# Patient Record
Sex: Female | Born: 1955 | Race: White | Hispanic: No | Marital: Married | Smoking: Current every day smoker
Health system: Southern US, Community
[De-identification: ages and names within clinical notes are randomized; demographics above are authoritative.]

## PROBLEM LIST (undated history)

## (undated) DIAGNOSIS — I10 Essential (primary) hypertension: Secondary | ICD-10-CM

## (undated) DIAGNOSIS — F329 Major depressive disorder, single episode, unspecified: Secondary | ICD-10-CM

## (undated) DIAGNOSIS — K219 Gastro-esophageal reflux disease without esophagitis: Secondary | ICD-10-CM

## (undated) DIAGNOSIS — E119 Type 2 diabetes mellitus without complications: Secondary | ICD-10-CM

## (undated) DIAGNOSIS — J449 Chronic obstructive pulmonary disease, unspecified: Secondary | ICD-10-CM

## (undated) DIAGNOSIS — F419 Anxiety disorder, unspecified: Secondary | ICD-10-CM

## (undated) DIAGNOSIS — T884XXA Failed or difficult intubation, initial encounter: Secondary | ICD-10-CM

## (undated) DIAGNOSIS — F32A Depression, unspecified: Secondary | ICD-10-CM

## (undated) HISTORY — PX: COLON SURGERY: SHX602

## (undated) HISTORY — PX: ABDOMINAL HYSTERECTOMY: SHX81

## (undated) HISTORY — DX: Gastro-esophageal reflux disease without esophagitis: K21.9

## (undated) HISTORY — PX: TONSILLECTOMY: SUR1361

## (undated) HISTORY — PX: APPENDECTOMY: SHX54

## (undated) HISTORY — DX: Anxiety disorder, unspecified: F41.9

## (undated) HISTORY — DX: Depression, unspecified: F32.A

## (undated) HISTORY — DX: Major depressive disorder, single episode, unspecified: F32.9

---

## 2011-01-11 ENCOUNTER — Emergency Department: Payer: Self-pay | Admitting: Internal Medicine

## 2012-02-04 ENCOUNTER — Emergency Department: Payer: Self-pay | Admitting: Emergency Medicine

## 2012-02-04 LAB — BASIC METABOLIC PANEL
Anion Gap: 6 — ABNORMAL LOW (ref 7–16)
BUN: 8 mg/dL (ref 7–18)
Calcium, Total: 9.3 mg/dL (ref 8.5–10.1)
Chloride: 105 mmol/L (ref 98–107)
Co2: 29 mmol/L (ref 21–32)
Creatinine: 0.79 mg/dL (ref 0.60–1.30)
EGFR (African American): >60
EGFR (Non-African Amer.): >60
Glucose: 110 mg/dL — ABNORMAL HIGH (ref 65–99)
Osmolality: 278 (ref 275–301)
Potassium: 4 mmol/L (ref 3.5–5.1)
Sodium: 140 mmol/L (ref 136–145)

## 2012-02-04 LAB — CBC WITH DIFFERENTIAL/PLATELET
Basophil #: 0.1 10*3/uL (ref 0.0–0.1)
Basophil %: 0.6 %
Eosinophil #: 0.2 10*3/uL (ref 0.0–0.7)
Eosinophil %: 1.2 %
HCT: 41.5 % (ref 35.0–47.0)
HGB: 14.5 g/dL (ref 12.0–16.0)
Lymphocyte #: 4.8 10*3/uL — ABNORMAL HIGH (ref 1.0–3.6)
Lymphocyte %: 31 %
MCH: 30.3 pg (ref 26.0–34.0)
MCHC: 34.8 g/dL (ref 32.0–36.0)
MCV: 87 fL (ref 80–100)
Monocyte #: 0.8 x10 3/mm (ref 0.2–0.9)
Monocyte %: 5.1 %
Neutrophil #: 9.6 10*3/uL — ABNORMAL HIGH (ref 1.4–6.5)
Neutrophil %: 62.1 %
Platelet: 255 10*3/uL (ref 150–440)
RBC: 4.77 10*6/uL (ref 3.80–5.20)
RDW: 14.1 % (ref 11.5–14.5)
WBC: 15.4 10*3/uL — ABNORMAL HIGH (ref 3.6–11.0)

## 2012-08-14 ENCOUNTER — Emergency Department: Payer: Self-pay | Admitting: Internal Medicine

## 2013-06-17 ENCOUNTER — Emergency Department: Payer: Self-pay | Admitting: Emergency Medicine

## 2015-07-25 ENCOUNTER — Encounter: Payer: Self-pay | Admitting: Emergency Medicine

## 2015-07-25 ENCOUNTER — Emergency Department: Payer: Self-pay

## 2015-07-25 ENCOUNTER — Emergency Department
Admission: EM | Admit: 2015-07-25 | Discharge: 2015-07-25 | Disposition: A | Discharge status: Home / Self Care | Payer: Self-pay | Attending: Emergency Medicine | Admitting: Emergency Medicine

## 2015-07-25 DIAGNOSIS — I10 Essential (primary) hypertension: Secondary | ICD-10-CM | POA: Insufficient documentation

## 2015-07-25 DIAGNOSIS — F17210 Nicotine dependence, cigarettes, uncomplicated: Secondary | ICD-10-CM | POA: Insufficient documentation

## 2015-07-25 DIAGNOSIS — J209 Acute bronchitis, unspecified: Secondary | ICD-10-CM

## 2015-07-25 DIAGNOSIS — Z7952 Long term (current) use of systemic steroids: Secondary | ICD-10-CM | POA: Insufficient documentation

## 2015-07-25 DIAGNOSIS — J440 Chronic obstructive pulmonary disease with acute lower respiratory infection: Secondary | ICD-10-CM | POA: Insufficient documentation

## 2015-07-25 DIAGNOSIS — Z79899 Other long term (current) drug therapy: Secondary | ICD-10-CM | POA: Insufficient documentation

## 2015-07-25 HISTORY — DX: Chronic obstructive pulmonary disease, unspecified: J44.9

## 2015-07-25 HISTORY — DX: Essential (primary) hypertension: I10

## 2015-07-25 MED ORDER — AZITHROMYCIN 250 MG PO TABS: ORAL_TABLET | ORAL | Status: DC

## 2015-07-25 MED ORDER — METHYLPREDNISOLONE SODIUM SUCC 125 MG IJ SOLR
125.0000 mg | Freq: Once | INTRAMUSCULAR | Status: AC
  Administered 2015-07-25: 125 mg via INTRAMUSCULAR
  Filled 2015-07-25: qty 2

## 2015-07-25 MED ORDER — PREDNISONE 10 MG PO TABS: 10.0000 mg | ORAL_TABLET | ORAL | Status: DC

## 2015-07-25 MED ORDER — IPRATROPIUM-ALBUTEROL 0.5-2.5 (3) MG/3ML IN SOLN
3.0000 mL | Freq: Once | RESPIRATORY_TRACT | Status: AC
  Administered 2015-07-25: 3 mL via RESPIRATORY_TRACT
  Filled 2015-07-25: qty 3

## 2015-07-25 MED ORDER — ALBUTEROL SULFATE HFA 108 (90 BASE) MCG/ACT IN AERS: 2.0000 | INHALATION_SPRAY | RESPIRATORY_TRACT | Status: DC | PRN

## 2015-07-25 NOTE — Discharge Instructions (Signed)

## 2015-07-25 NOTE — ED Provider Notes (Signed)
[facility] Emergency Department Provider Note  ____________________________________________  Time seen: Approximately 3:42 PM  I have reviewed the triage vital signs and the nursing notes.   HISTORY  Chief Complaint Cough    HPI Karla Carter is a 60 y.o. female who presents emergency Department 4 week history of coughing and "shortness of breath." Per the patient symptoms began insidiously. They have increased over the intervening period. Patient states that she has coughing spells that create her shortness of breath. Patient states that the cough is dry in nature. She endorses some subjective tactile low-grade fevers. She denies any headache, nasal congestion, sore throat, chest pain, abdominal pain, nausea or vomiting.   Past Medical History  Diagnosis Date  . COPD (chronic obstructive pulmonary disease) ([place])   . Hypertension     There are no active problems to display for this patient.   Past Surgical History  Procedure Laterality Date  . Tonsillectomy    . Appendectomy    . Abdominal hysterectomy    . Colon surgery      Current Outpatient Rx  Name  Route  Sig  Dispense  Refill  . amLODipine (NORVASC) 10 MG tablet   Oral   Take 10 mg by mouth daily.         Marland Kitchen albuterol (PROVENTIL HFA;VENTOLIN HFA) 108 (90 Base) MCG/ACT inhaler   Inhalation   Inhale 2 puffs into the lungs every 4 (four) hours as needed for wheezing or shortness of breath.   1 Inhaler   0   . azithromycin (ZITHROMAX Z-PAK) 250 MG tablet      Take 2 tablets (500 mg) on  Day 1,  followed by 1 tablet (250 mg) once daily on Days 2 through 5.   6 each   0   . predniSONE (DELTASONE) 10 MG tablet   Oral   Take 1 tablet (10 mg total) by mouth as directed.   21 tablet   0     Take on a daily basis of 6, 5, 4, 3, 2, 1     Allergies Codeine  History reviewed. No pertinent family history.  Social History Social History  Substance Use Topics  . Smoking status:  Current Every Day Smoker -- 1.00 packs/day    Types: Cigarettes  . Smokeless tobacco: None  . Alcohol Use: No     Review of Systems  Constitutional: No fever/chills Eyes: No visual changes. No discharge ENT: No sore throat. No nasal congestion. Cardiovascular: no chest pain. Respiratory: Positive for cough. Positive for mild posttussive SOB. Gastrointestinal: No abdominal pain.  No nausea, no vomiting.  No diarrhea.  No constipation. Skin: Negative for rash. Neurological: Negative for headaches, focal weakness or numbness. 10-point ROS otherwise negative.  ____________________________________________   PHYSICAL EXAM:  VITAL SIGNS: ED Triage Vitals  Enc Vitals Group     BP 07/25/15 1437 102/87 mmHg     Pulse Rate 07/25/15 1437 91     Resp 07/25/15 1437 18     Temp 07/25/15 1437 98.8 F (37.1 C)     Temp Source 07/25/15 1437 Oral     SpO2 07/25/15 1437 97 %     Weight 07/25/15 1437 170 lb (77.111 kg)     Height 07/25/15 1437 5\' 4"  (1.626 m)     Head Cir --      Peak Flow --      Pain Score 07/25/15 1440 0     Pain Loc --  Pain Edu? --      Excl. in [place]? --      Constitutional: Alert and oriented. Well appearing and in no acute distress. Eyes: Conjunctivae are normal. PERRL. EOMI. Head: Atraumatic. ENT:      Ears: EACs and TMs are unremarkable bilaterally.      Nose: No congestion/rhinnorhea.      Mouth/Throat: Mucous membranes are moist. Oropharynx is nonerythematous and nonedematous. Tonsils are unremarkable bilaterally. Neck: No stridor.   Hematological/Lymphatic/Immunilogical: No cervical lymphadenopathy. Cardiovascular: Normal rate, regular rhythm. Normal S1 and S2.  Good peripheral circulation. Respiratory: Normal respiratory effort without tachypnea or retractions. Lungs with scattered expiratory wheezing bilateral lung fields. No rales or rhonchi. No decreased or absent breath sounds. Good air entry into the bases. Gastrointestinal: Soft and nontender.  No distention. No CVA tenderness. Musculoskeletal: No lower extremity tenderness nor edema.  No joint effusions. Neurologic:  Normal speech and language. No gross focal neurologic deficits are appreciated.  Skin:  Skin is warm, dry and intact. No rash noted. Psychiatric: Mood and affect are normal. Speech and behavior are normal. Patient exhibits appropriate insight and judgement.   ____________________________________________   LABS (all labs ordered are listed, but only abnormal results are displayed)  Labs Reviewed - No data to display ____________________________________________  EKG   ____________________________________________  RADIOLOGY Diamantina Providence Sadrac Zeoli, personally viewed and evaluated these images (plain radiographs) as part of my medical decision making, as well as reviewing the written report by the radiologist.  Dg Chest 2 View  07/25/2015  CLINICAL DATA:  One week history of cough. Hypertension. Tobacco use. EXAM: CHEST  2 VIEW COMPARISON:  August 14, 2012 FINDINGS: The lungs are clear. The heart size and pulmonary vascularity are normal. No adenopathy. No bone lesions. IMPRESSION: No edema or consolidation. Electronically Signed   By: Lowella Grip III M.D.   On: 07/25/2015 15:36    ____________________________________________    PROCEDURES  Procedure(s) performed:       Medications  methylPREDNISolone sodium succinate (SOLU-MEDROL) 125 mg/2 mL injection 125 mg (not administered)  ipratropium-albuterol (DUONEB) 0.5-2.5 (3) MG/3ML nebulizer solution 3 mL (not administered)     ____________________________________________   INITIAL IMPRESSION / ASSESSMENT AND PLAN / ED COURSE  Pertinent labs & imaging results that were available during my care of the patient were reviewed by me and considered in my medical decision making (see chart for details).  Patient's diagnosis is consistent with bronchitis. Patient is treated with DuoNeb and IM injection  of Solu-Medrol here in the emergency department.. Patient will be discharged home with prescriptions for albuterol inhaler, prednisone taper, and Zithromax. Patient is to follow up with primary care provider if symptoms persist past this treatment course. Patient is given ED precautions to return to the ED for any worsening or new symptoms.     ____________________________________________  FINAL CLINICAL IMPRESSION(S) / ED DIAGNOSES  Final diagnoses:  Acute bronchitis, unspecified organism      NEW MEDICATIONS STARTED DURING THIS VISIT:  New Prescriptions   ALBUTEROL (PROVENTIL HFA;VENTOLIN HFA) 108 (90 BASE) MCG/ACT INHALER    Inhale 2 puffs into the lungs every 4 (four) hours as needed for wheezing or shortness of breath.   AZITHROMYCIN (ZITHROMAX Z-PAK) 250 MG TABLET    Take 2 tablets (500 mg) on  Day 1,  followed by 1 tablet (250 mg) once daily on Days 2 through 5.   PREDNISONE (DELTASONE) 10 MG TABLET    Take 1 tablet (10 mg total) by mouth as directed.  Charline Bills Latrease Kunde, PA-C 07/25/15 [place], MD 07/26/15 [phone]

## 2015-07-25 NOTE — ED Notes (Signed)
Cough x 1 week and coughing spasms that make her feel like she cant catch her breath

## 2015-07-29 ENCOUNTER — Encounter: Payer: Self-pay | Admitting: *Deleted

## 2015-07-29 ENCOUNTER — Emergency Department: Payer: Self-pay

## 2015-07-29 ENCOUNTER — Inpatient Hospital Stay
Admission: EM | Admit: 2015-07-29 | Discharge: 2015-07-31 | DRG: 192 | Disposition: A | Discharge status: Home / Self Care | Payer: Self-pay | Attending: Specialist | Admitting: Specialist

## 2015-07-29 DIAGNOSIS — J069 Acute upper respiratory infection, unspecified: Secondary | ICD-10-CM

## 2015-07-29 DIAGNOSIS — E162 Hypoglycemia, unspecified: Secondary | ICD-10-CM | POA: Diagnosis present

## 2015-07-29 DIAGNOSIS — R197 Diarrhea, unspecified: Secondary | ICD-10-CM | POA: Diagnosis present

## 2015-07-29 DIAGNOSIS — J440 Chronic obstructive pulmonary disease with acute lower respiratory infection: Principal | ICD-10-CM | POA: Diagnosis present

## 2015-07-29 DIAGNOSIS — R0902 Hypoxemia: Secondary | ICD-10-CM | POA: Diagnosis present

## 2015-07-29 DIAGNOSIS — F17210 Nicotine dependence, cigarettes, uncomplicated: Secondary | ICD-10-CM | POA: Diagnosis present

## 2015-07-29 DIAGNOSIS — R110 Nausea: Secondary | ICD-10-CM

## 2015-07-29 DIAGNOSIS — R739 Hyperglycemia, unspecified: Secondary | ICD-10-CM | POA: Diagnosis present

## 2015-07-29 DIAGNOSIS — T380X5A Adverse effect of glucocorticoids and synthetic analogues, initial encounter: Secondary | ICD-10-CM | POA: Diagnosis present

## 2015-07-29 DIAGNOSIS — I10 Essential (primary) hypertension: Secondary | ICD-10-CM | POA: Diagnosis present

## 2015-07-29 DIAGNOSIS — J209 Acute bronchitis, unspecified: Secondary | ICD-10-CM | POA: Diagnosis present

## 2015-07-29 DIAGNOSIS — J441 Chronic obstructive pulmonary disease with (acute) exacerbation: Secondary | ICD-10-CM | POA: Diagnosis present

## 2015-07-29 DIAGNOSIS — Z794 Long term (current) use of insulin: Secondary | ICD-10-CM

## 2015-07-29 DIAGNOSIS — E876 Hypokalemia: Secondary | ICD-10-CM | POA: Diagnosis present

## 2015-07-29 LAB — CBC
HCT: 44.1 % (ref 35.0–47.0)
HEMOGLOBIN: 15 g/dL (ref 12.0–16.0)
MCH: 29.1 pg (ref 26.0–34.0)
MCHC: 34.1 g/dL (ref 32.0–36.0)
MCV: 85.3 fL (ref 80.0–100.0)
PLATELETS: 244 10*3/uL (ref 150–440)
RBC: 5.16 MIL/uL (ref 3.80–5.20)
RDW: 13.7 % (ref 11.5–14.5)
WBC: 22.4 10*3/uL — AB (ref 3.6–11.0)

## 2015-07-29 LAB — COMPREHENSIVE METABOLIC PANEL
ALK PHOS: 104 U/L (ref 38–126)
ALT: 71 U/L — AB (ref 14–54)
AST: 49 U/L — AB (ref 15–41)
Albumin: 4.1 g/dL (ref 3.5–5.0)
Anion gap: 12 (ref 5–15)
BUN: 10 mg/dL (ref 6–20)
CHLORIDE: 102 mmol/L (ref 101–111)
CO2: 22 mmol/L (ref 22–32)
CREATININE: 0.87 mg/dL (ref 0.44–1.00)
Calcium: 9 mg/dL (ref 8.9–10.3)
Glucose, Bld: 172 mg/dL — ABNORMAL HIGH (ref 65–99)
Potassium: 3.2 mmol/L — ABNORMAL LOW (ref 3.5–5.1)
SODIUM: 136 mmol/L (ref 135–145)
TOTAL PROTEIN: 7.8 g/dL (ref 6.5–8.1)
Total Bilirubin: 1.1 mg/dL (ref 0.3–1.2)

## 2015-07-29 LAB — RAPID INFLUENZA A&B ANTIGENS
Influenza A (ARMC): NEGATIVE
Influenza B (ARMC): NEGATIVE

## 2015-07-29 MED ORDER — AZITHROMYCIN 250 MG PO TABS: 250.0000 mg | ORAL_TABLET | Freq: Every day | ORAL | Status: AC

## 2015-07-29 MED ORDER — IPRATROPIUM-ALBUTEROL 0.5-2.5 (3) MG/3ML IN SOLN
3.0000 mL | Freq: Once | RESPIRATORY_TRACT | Status: AC
  Administered 2015-07-29: 3 mL via RESPIRATORY_TRACT
  Filled 2015-07-29: qty 3

## 2015-07-29 MED ORDER — PREDNISONE 20 MG PO TABS
60.0000 mg | ORAL_TABLET | Freq: Once | ORAL | Status: AC
  Administered 2015-07-29: 60 mg via ORAL
  Filled 2015-07-29: qty 3

## 2015-07-29 MED ORDER — SODIUM CHLORIDE 0.9 % IV BOLUS (SEPSIS)
1000.0000 mL | Freq: Once | INTRAVENOUS | Status: AC
  Administered 2015-07-29: 1000 mL via INTRAVENOUS
  Filled 2015-07-29: qty 1000

## 2015-07-29 MED ORDER — ONDANSETRON HCL 4 MG/2ML IJ SOLN
4.0000 mg | Freq: Once | INTRAMUSCULAR | Status: AC
  Administered 2015-07-29: 4 mg via INTRAVENOUS
  Filled 2015-07-29: qty 2

## 2015-07-29 MED ORDER — POTASSIUM CHLORIDE CRYS ER 20 MEQ PO TBCR
40.0000 meq | EXTENDED_RELEASE_TABLET | Freq: Once | ORAL | Status: AC
  Administered 2015-07-29: 40 meq via ORAL
  Filled 2015-07-29: qty 2

## 2015-07-29 MED ORDER — SODIUM CHLORIDE 0.9 % IV BOLUS (SEPSIS)
1000.0000 mL | Freq: Once | INTRAVENOUS | Status: AC
  Administered 2015-07-29: 1000 mL via INTRAVENOUS

## 2015-07-29 MED ORDER — ACETAMINOPHEN 325 MG PO TABS: 650.0000 mg | ORAL_TABLET | Freq: Four times a day (QID) | ORAL | Status: DC | PRN

## 2015-07-29 MED ORDER — ONDANSETRON HCL 4 MG/2ML IJ SOLN: 4.0000 mg | Freq: Four times a day (QID) | INTRAMUSCULAR | Status: DC | PRN

## 2015-07-29 MED ORDER — ONDANSETRON 4 MG PO TBDP: 4.0000 mg | ORAL_TABLET | Freq: Three times a day (TID) | ORAL | Status: DC | PRN

## 2015-07-29 MED ORDER — ACETAMINOPHEN 650 MG RE SUPP: 650.0000 mg | Freq: Four times a day (QID) | RECTAL | Status: DC | PRN

## 2015-07-29 MED ORDER — AZITHROMYCIN 500 MG PO TABS
500.0000 mg | ORAL_TABLET | Freq: Once | ORAL | Status: AC
  Administered 2015-07-29: 500 mg via ORAL
  Filled 2015-07-29: qty 1

## 2015-07-29 MED ORDER — METHYLPREDNISOLONE SODIUM SUCC 40 MG IJ SOLR
40.0000 mg | Freq: Two times a day (BID) | INTRAMUSCULAR | Status: DC
  Administered 2015-07-29 – 2015-07-30 (×2): 40 mg via INTRAVENOUS
  Filled 2015-07-29 (×2): qty 1

## 2015-07-29 MED ORDER — DIPHENOXYLATE-ATROPINE 2.5-0.025 MG PO TABS
1.0000 | ORAL_TABLET | Freq: Once | ORAL | Status: AC
  Administered 2015-07-29: 1 via ORAL
  Filled 2015-07-29: qty 1

## 2015-07-29 MED ORDER — IPRATROPIUM-ALBUTEROL 0.5-2.5 (3) MG/3ML IN SOLN
3.0000 mL | Freq: Four times a day (QID) | RESPIRATORY_TRACT | Status: DC
  Administered 2015-07-29 – 2015-07-30 (×3): 3 mL via RESPIRATORY_TRACT
  Filled 2015-07-29 (×3): qty 3

## 2015-07-29 MED ORDER — ONDANSETRON 4 MG PO TBDP
4.0000 mg | ORAL_TABLET | Freq: Once | ORAL | Status: AC
  Administered 2015-07-29: 4 mg via ORAL
  Filled 2015-07-29: qty 1

## 2015-07-29 MED ORDER — ZOLPIDEM TARTRATE 5 MG PO TABS
5.0000 mg | ORAL_TABLET | Freq: Every evening | ORAL | Status: DC | PRN
  Administered 2015-07-29 – 2015-07-30 (×2): 5 mg via ORAL
  Filled 2015-07-29 (×2): qty 1

## 2015-07-29 MED ORDER — LOPERAMIDE HCL 2 MG PO TABS: 2.0000 mg | ORAL_TABLET | Freq: Four times a day (QID) | ORAL | Status: DC | PRN

## 2015-07-29 MED ORDER — PREDNISONE 20 MG PO TABS: 60.0000 mg | ORAL_TABLET | Freq: Every day | ORAL | Status: DC

## 2015-07-29 MED ORDER — FAMOTIDINE 20 MG PO TABS
20.0000 mg | ORAL_TABLET | Freq: Every day | ORAL | Status: DC
  Administered 2015-07-30 – 2015-07-31 (×2): 20 mg via ORAL
  Filled 2015-07-29 (×3): qty 1

## 2015-07-29 MED ORDER — AMLODIPINE BESYLATE 10 MG PO TABS
10.0000 mg | ORAL_TABLET | Freq: Every day | ORAL | Status: DC
  Administered 2015-07-30 – 2015-07-31 (×2): 10 mg via ORAL
  Filled 2015-07-29 (×3): qty 1

## 2015-07-29 MED ORDER — AZITHROMYCIN 250 MG PO TABS
250.0000 mg | ORAL_TABLET | Freq: Every day | ORAL | Status: DC
  Administered 2015-07-30 – 2015-07-31 (×2): 250 mg via ORAL
  Filled 2015-07-29 (×2): qty 1

## 2015-07-29 MED ORDER — ALBUTEROL SULFATE (2.5 MG/3ML) 0.083% IN NEBU: 2.5000 mg | INHALATION_SOLUTION | RESPIRATORY_TRACT | Status: DC | PRN

## 2015-07-29 MED ORDER — ONDANSETRON HCL 4 MG PO TABS: 4.0000 mg | ORAL_TABLET | Freq: Four times a day (QID) | ORAL | Status: DC | PRN

## 2015-07-29 MED ORDER — NICOTINE 7 MG/24HR TD PT24
7.0000 mg | MEDICATED_PATCH | Freq: Every day | TRANSDERMAL | Status: DC
  Filled 2015-07-29 (×3): qty 1

## 2015-07-29 MED ORDER — ENOXAPARIN SODIUM 40 MG/0.4ML ~~LOC~~ SOLN
40.0000 mg | SUBCUTANEOUS | Status: DC
  Administered 2015-07-29 – 2015-07-30 (×2): 40 mg via SUBCUTANEOUS
  Filled 2015-07-29 (×2): qty 0.4

## 2015-07-29 MED ORDER — DOCUSATE SODIUM 100 MG PO CAPS
100.0000 mg | ORAL_CAPSULE | Freq: Two times a day (BID) | ORAL | Status: DC
  Administered 2015-07-30 – 2015-07-31 (×2): 100 mg via ORAL
  Filled 2015-07-29 (×3): qty 1

## 2015-07-29 NOTE — H&P (Signed)
[facility] [place] NAME: Gurkirat Raspanti    MR#:  [id number]  DATE OF BIRTH:  05-20-1956  DATE OF ADMISSION:  07/29/2015  PRIMARY CARE PHYSICIAN: No PCP Per Patient   REQUESTING/REFERRING PHYSICIAN: Mariea Clonts  CHIEF COMPLAINT:  sob  HISTORY OF PRESENT ILLNESS:  Nylani Rochell  is a 60 y.o. female with a known history of copd ,essential HTN and continues to smoke is presenting with SOB. Fever was 102 last night. Reporting productive cough with greenish yellow phlegm. Shortness of breath is progressively getting worse which prompted him to come to the ED. Flu test is negative chest x-ray has revealed a bronchitic markings but no pneumonia.  PAST MEDICAL HISTORY:   Past Medical History  Diagnosis Date  . COPD (chronic obstructive pulmonary disease) ([place])   . Hypertension     PAST SURGICAL HISTOIRY:   Past Surgical History  Procedure Laterality Date  . Tonsillectomy    . Appendectomy    . Abdominal hysterectomy    . Colon surgery      SOCIAL HISTORY:   Social History  Substance Use Topics  . Smoking status: Current Every Day Smoker -- 1.00 packs/day    Types: Cigarettes  . Smokeless tobacco: Not on file  . Alcohol Use: No    FAMILY HISTORY:  HTN runs in family  DRUG ALLERGIES:   Allergies  Allergen Reactions  . Codeine Rash    REVIEW OF SYSTEMS:  CONSTITUTIONAL: No fever, fatigue or weakness.  EYES: No blurred or double vision.  EARS, NOSE, AND THROAT: No tinnitus or ear pain.  RESPIRATORY: Reporting cough, shortness of breath, wheezing , no  hemoptysis.  CARDIOVASCULAR: No chest pain, orthopnea, edema.  GASTROINTESTINAL: No nausea, vomiting, diarrhea or abdominal pain.  GENITOURINARY: No dysuria, hematuria.  ENDOCRINE: No polyuria, nocturia,  HEMATOLOGY: No anemia, easy bruising or bleeding SKIN: No rash or lesion. MUSCULOSKELETAL: No joint pain or arthritis.   NEUROLOGIC: No tingling, numbness, weakness.   PSYCHIATRY: No anxiety or depression.   MEDICATIONS AT HOME:   Prior to Admission medications   Medication Sig Start Date End Date Taking? Authorizing Provider  albuterol (PROVENTIL HFA;VENTOLIN HFA) 108 (90 Base) MCG/ACT inhaler Inhale 2 puffs into the lungs every 4 (four) hours as needed for wheezing or shortness of breath. 07/25/15  Yes Roderic Palau D Cuthriell, PA-C  amLODipine (NORVASC) 10 MG tablet Take 10 mg by mouth daily.   Yes Historical Provider, MD  azithromycin (ZITHROMAX) 250 MG tablet Take 1 tablet (250 mg total) by mouth daily. Take 1 tablet daily for 3 days. 07/29/15 08/01/15  Eula Listen, MD  loperamide (LOPERAMIDE A-D) 2 MG tablet Take 1 tablet (2 mg total) by mouth 4 (four) times daily as needed for diarrhea or loose stools. 07/29/15   Anne-Caroline Mariea Clonts, MD  ondansetron (ZOFRAN ODT) 4 MG disintegrating tablet Take 1 tablet (4 mg total) by mouth every 8 (eight) hours as needed for nausea or vomiting. 07/29/15   Eula Listen, MD  predniSONE (DELTASONE) 20 MG tablet Take 3 tablets (60 mg total) by mouth daily. 07/29/15   Eula Listen, MD      VITAL SIGNS:  Blood pressure 116/46, pulse 78, temperature 97.9 F (36.6 C), temperature source Oral, resp. rate 18, height 5\' 4"  (1.626 m), weight 73.936 kg (163 lb), SpO2 95 %.  PHYSICAL EXAMINATION:  GENERAL:  60 y.o.-year-old patient lying in the bed with no acute distress.  EYES: Pupils equal, round, reactive to light and  accommodation. No scleral icterus. Extraocular muscles intact.  HEENT: Head atraumatic, normocephalic. Oropharynx and nasopharynx clear.  NECK:  Supple, no jugular venous distention. No thyroid enlargement, no tenderness.  LUNGS: coarse , bronchial breath sounds bilaterally, minimal wheezing, no rales,rhonchi or crepitation. No use of accessory muscles of respiration.  CARDIOVASCULAR: S1, S2 normal. No murmurs, rubs, or gallops.  ABDOMEN: Soft, nontender, nondistended. Bowel sounds present. No  organomegaly or mass.  EXTREMITIES: No pedal edema, cyanosis, or clubbing.  NEUROLOGIC: Cranial nerves II through XII are intact. Muscle strength 5/5 in all extremities. Sensation intact. Gait not checked.  PSYCHIATRIC: The patient is alert and oriented x 3.  SKIN: No obvious rash, lesion, or ulcer.   LABORATORY PANEL:   CBC  Recent Labs Lab 07/29/15 1247  WBC 22.4*  HGB 15.0  HCT 44.1  PLT 244   ------------------------------------------------------------------------------------------------------------------  Chemistries   Recent Labs Lab 07/29/15 1247  NA 136  K 3.2*  CL 102  CO2 22  GLUCOSE 172*  BUN 10  CREATININE 0.87  CALCIUM 9.0  AST 49*  ALT 71*  ALKPHOS 104  BILITOT 1.1   ------------------------------------------------------------------------------------------------------------------  Cardiac Enzymes No results for input(s): TROPONINI in the last 168 hours. ------------------------------------------------------------------------------------------------------------------  RADIOLOGY:  Dg Chest 2 View  07/29/2015  CLINICAL DATA:  Shortness of breath EXAM: CHEST  2 VIEW COMPARISON:  07/25/2015 FINDINGS: Stable bronchitic appearing interstitial coarsening. Mild hyperinflation correlating with COPD history. There is no edema, consolidation, effusion, or pneumothorax. Normal heart size and aortic contours. IMPRESSION: Bronchitic markings without focal pneumonia. Electronically Signed   By: Monte Fantasia M.D.   On: 07/29/2015 10:54    EKG:   Orders placed or performed during the hospital encounter of 07/29/15  . EKG 12-Lead  . EKG 12-Lead    IMPRESSION AND PLAN:   Patient is presenting to the ED for progressively worsening shortness of breath, productive cough and fever   #Acute respiratory distress secondary to COPD exacerbation with acute bronchitis Admit patient to MedSurg unit  provide IV Solu-Medrol, nebulizer treatments and Zithromax  Sputum  culture and sensitivity  Flu test is negative  # Essential hypertension Continue home meds Norvasc  # Hypokalemia Replete and check BMP in am  # Tobaco abuse disorder counselled pt to quit smoking for 3-4 min.start pt on nicotine patch  Gi prophylaxis with pepcid DVT prophylaxis - lovenox sq    All the records are reviewed and case discussed with ED provider. Management plans discussed with the patient, family and they are in agreement.  CODE STATUS: fc  TOTAL TIME TAKING CARE OF THIS PATIENT: 45 minutes.    Nicholes Mango M.D on 07/29/2015 at 9:43 PM  Between 7am to 6pm - Pager - [phone]  After 6pm go to [email or web address] - password [email or web address] [facility]  [facility]  Office  [phone]  CC: Primary care physician; No PCP Per Patient

## 2015-07-29 NOTE — ED Notes (Signed)
Pt is currently being treated for bronchitis, pt reports" I am not getting any better and have increased dyspnea"

## 2015-07-29 NOTE — ED Notes (Signed)
Pt to ed with c/o increasing sob and cough, reports recent diagnosis of bronchitis but reports does not feel better with treatment.  Pt alert and oriented, lungs with crackles noted.  Pt skin warm and dry,  Pt mildly tachypneic.  Pt placed on o2 at 2 lpm, sats 91% on ra.  Pt sats 97% on 2 lpm.  Pt alert and oriented.

## 2015-07-29 NOTE — ED Provider Notes (Signed)
[facility] Emergency Department Provider Note  ____________________________________________  Time seen: Approximately 12:28 PM  I have reviewed the triage vital signs and the nursing notes.   HISTORY  Chief Complaint Shortness of Breath    HPI Karla Carter is a 60 y.o. female with a history of COPD with ongoing tobacco abuse,HTN presenting with several days of cough, fever, nausea without vomiting, and diarrhea. Patient reports that over the past day she's had a cough productive of thick green sputum. This has been associated with increasing shortness of breath but no chest pain. No lightheadedness or syncope. She has also had congestion and rhinorrhea, nausea without vomiting and loose stool. Positive fever to 102 last night. She did not get a flu shot this year.   Past Medical History  Diagnosis Date  . COPD (chronic obstructive pulmonary disease) ([place])   . Hypertension     There are no active problems to display for this patient.   Past Surgical History  Procedure Laterality Date  . Tonsillectomy    . Appendectomy    . Abdominal hysterectomy    . Colon surgery      Current Outpatient Rx  Name  Route  Sig  Dispense  Refill  . albuterol (PROVENTIL HFA;VENTOLIN HFA) 108 (90 Base) MCG/ACT inhaler   Inhalation   Inhale 2 puffs into the lungs every 4 (four) hours as needed for wheezing or shortness of breath.   1 Inhaler   0   . amLODipine (NORVASC) 10 MG tablet   Oral   Take 10 mg by mouth daily.         Marland Kitchen azithromycin (ZITHROMAX) 250 MG tablet   Oral   Take 1 tablet (250 mg total) by mouth daily. Take 1 tablet daily for 3 days.   4 tablet   0   . loperamide (LOPERAMIDE A-D) 2 MG tablet   Oral   Take 1 tablet (2 mg total) by mouth 4 (four) times daily as needed for diarrhea or loose stools.   12 tablet   0   . ondansetron (ZOFRAN ODT) 4 MG disintegrating tablet   Oral   Take 1 tablet (4 mg total) by mouth every 8 (eight) hours  as needed for nausea or vomiting.   20 tablet   0   . predniSONE (DELTASONE) 20 MG tablet   Oral   Take 3 tablets (60 mg total) by mouth daily.   15 tablet   0     Allergies Codeine  No family history on file.  Social History Social History  Substance Use Topics  . Smoking status: Current Every Day Smoker -- 1.00 packs/day    Types: Cigarettes  . Smokeless tobacco: None  . Alcohol Use: No    Review of Systems Constitutional: Positive fever. No lightheadedness. No syncope. Eyes: No visual changes. No eye discharge. ENT: No sore throat. Positive congestion and rhinorrhea. Cardiovascular: Denies chest pain, palpitations. Respiratory: Positive shortness of breath.  Positive productive cough. Gastrointestinal: No abdominal pain.  Positive nausea, no vomiting.  Positive diarrhea.  No constipation. Genitourinary: Negative for dysuria. Musculoskeletal: Negative for back pain. Skin: Negative for rash. Neurological: Negative for headaches, focal weakness or numbness.  10-point ROS otherwise negative.  ____________________________________________   PHYSICAL EXAM:  VITAL SIGNS: ED Triage Vitals  Enc Vitals Group     BP 07/29/15 1015 134/76 mmHg     Pulse Rate 07/29/15 1015 101     Resp 07/29/15 1015 24  Temp 07/29/15 1015 98.3 F (36.8 C)     Temp Source 07/29/15 1015 Oral     SpO2 07/29/15 1015 94 %     Weight 07/29/15 1015 170 lb (77.111 kg)     Height 07/29/15 1015 5\' 4"  (1.626 m)     Head Cir --      Peak Flow --      Pain Score --      Pain Loc --      Pain Edu? --      Excl. in [place]? --     Constitutional: Patient is alert and oriented and answering questions appropriately. She is chronically ill-appearing but in no acute distress.  Eyes: Conjunctivae are normal.  EOMI. no eye discharge. Head: Atraumatic. Nose: Positive congestion without rhinorrhea. Mouth/Throat: Mucous membranes are dry.  Neck: No stridor.  Supple.  No JVD. Cardiovascular: Normal  rate, regular rhythm. No murmurs, rubs or gallops.  Respiratory: Patient is tachypnea to the low 20s, but this resolves when she is speaking. She is able to speak in full sentences. Oxygen saturations are in the mid to high 90s. Positive mild accessory muscle use without retractions. Diffuse end expiratory wheezing without rales or rhonchi. Good air exchange. Gastrointestinal: Soft and nontender. No distention. No peritoneal signs. Musculoskeletal: No LE edema. No palpable cords or tenderness to palpation in the calves. Neurologic:  Normal speech and language. No gross focal neurologic deficits are appreciated.  Skin:  Skin is warm, dry and intact. No rash noted. Psychiatric: Mood and affect are normal. Speech and behavior are normal.  Normal judgement.  ____________________________________________   LABS (all labs ordered are listed, but only abnormal results are displayed)  Labs Reviewed  CBC - Abnormal; Notable for the following:    WBC 22.4 (*)    All other components within normal limits  COMPREHENSIVE METABOLIC PANEL - Abnormal; Notable for the following:    Potassium 3.2 (*)    Glucose, Bld 172 (*)    AST 49 (*)    ALT 71 (*)    All other components within normal limits  RAPID INFLUENZA A&B ANTIGENS ([facility] ONLY)   ____________________________________________  EKG  ED ECG REPORT I, Eula Listen, the attending physician, personally viewed and interpreted this ECG.   Date: 07/29/2015  EKG Time: 1004 Rate:  101  Rhythm: sinus tachycardia  Axis:  nonspecific T-wave inversion in V5 and V6.  Intervals:none  ST&T Change:  no ST elevation. No ischemic changes.  ____________________________________________  RADIOLOGY  Dg Chest 2 View  07/29/2015  CLINICAL DATA:  Shortness of breath EXAM: CHEST  2 VIEW COMPARISON:  07/25/2015 FINDINGS: Stable bronchitic appearing interstitial coarsening. Mild hyperinflation correlating with COPD history. There is no edema,  consolidation, effusion, or pneumothorax. Normal heart size and aortic contours. IMPRESSION: Bronchitic markings without focal pneumonia. Electronically Signed   By: Monte Fantasia M.D.   On: 07/29/2015 10:54    ____________________________________________   PROCEDURES  Procedure(s) performed: None  Critical Care performed: No ____________________________________________   INITIAL IMPRESSION / ASSESSMENT AND PLAN / ED COURSE  Pertinent labs & imaging results that were available during my care of the patient were reviewed by me and considered in my medical decision making (see chart for details).  60 y.o. female with a history of COPD presenting with upper respiratory infectious symptoms as well as some GI tract disturbance. Overall, the patient appears uncomfortable but her symptoms resolved when I speak with her. She does have some wheezing on exam. She is  maintaining her oxygen saturations. It is possible that the patient has flu or flulike illness, or rub or respiratory infection. Consider COPD flare in the setting of this illness. Is unlikely that she has an acute coronary syndrome or cardiac cause for her symptoms. I will initiate symptomatically treatment and continue to evaluate the patient. If she is able to tolerate by mouth, she continues to maintain her oxygen saturations and otherwise normal vital signs, then I will plan to discharge her home with treatment and close PMD follow-up.  ----------------------------------------- 2:44 PM on 07/29/2015 -----------------------------------------  Patient is overall feeling better.  Her wheezing has significantly improved on repeat examination after the DuoNeb, although she still has some minimal end expiratory wheezing. I will give her another DuoNeb. Her nausea has him as completely resolved and she is able to tolerate Coca-[employer]a and water. Not had any further episodes of vomiting or diarrhea since she has been here.  Her laboratory  studies are notable for a negative influenza test, and mild hypokalemia which I have supplemented. She does have an elevated white blood cell count of 22.4, but is currently on prednisone and has a baseline white blood cell count but is generally in the high teens. I do not see any evidence that she has any other acute infection other than what has been discussed that would be driving this white blood cell count.  My plan is to discharge the patient home with symptomatically treatment for her nausea vomiting and diarrhea, as well as antibiotics and steroids for her acute COPD flare and upper respiratory illness. She does not have a primary care physician but I will give her the name of all of the clinics that see patients without insurance in the local area. The patient understands return precautions as well as follow-up instructions.  ----------------------------------------- 2:58 PM on 07/29/2015 -----------------------------------------  I had planned to discharge the patient home, however, when I did an ambulatory trial, the patient's oxygenation went down to 81% on room air despite only walking to the toilet and back. She does not have a history of wearing exogenous oxygen and does not have oxygen at home. Plan admission. ____________________________________________  FINAL CLINICAL IMPRESSION(S) / ED DIAGNOSES  Final diagnoses:  COPD exacerbation (HCC)  Nausea  Diarrhea, unspecified type  URI (upper respiratory infection)  Hypoxia      NEW MEDICATIONS STARTED DURING THIS VISIT:  New Prescriptions   AZITHROMYCIN (ZITHROMAX) 250 MG TABLET    Take 1 tablet (250 mg total) by mouth daily. Take 1 tablet daily for 3 days.   LOPERAMIDE (LOPERAMIDE A-D) 2 MG TABLET    Take 1 tablet (2 mg total) by mouth 4 (four) times daily as needed for diarrhea or loose stools.   ONDANSETRON (ZOFRAN ODT) 4 MG DISINTEGRATING TABLET    Take 1 tablet (4 mg total) by mouth every 8 (eight) hours as needed for  nausea or vomiting.   PREDNISONE (DELTASONE) 20 MG TABLET    Take 3 tablets (60 mg total) by mouth daily.     Eula Listen, MD 07/29/15 1459

## 2015-07-30 LAB — CBC
HCT: 37.1 % (ref 35.0–47.0)
HEMOGLOBIN: 12.8 g/dL (ref 12.0–16.0)
MCH: 29.8 pg (ref 26.0–34.0)
MCHC: 34.5 g/dL (ref 32.0–36.0)
MCV: 86.3 fL (ref 80.0–100.0)
Platelets: 198 10*3/uL (ref 150–440)
RBC: 4.3 MIL/uL (ref 3.80–5.20)
RDW: 13.5 % (ref 11.5–14.5)
WBC: 17.7 10*3/uL — AB (ref 3.6–11.0)

## 2015-07-30 LAB — EXPECTORATED SPUTUM ASSESSMENT W GRAM STAIN, RFLX TO RESP C

## 2015-07-30 LAB — BASIC METABOLIC PANEL
ANION GAP: 6 (ref 5–15)
BUN: 9 mg/dL (ref 6–20)
CALCIUM: 8.3 mg/dL — AB (ref 8.9–10.3)
CO2: 22 mmol/L (ref 22–32)
Chloride: 105 mmol/L (ref 101–111)
Creatinine, Ser: 0.71 mg/dL (ref 0.44–1.00)
GFR calc non Af Amer: >60 mL/min (ref >60)
Glucose, Bld: 198 mg/dL — ABNORMAL HIGH (ref 65–99)
Potassium: 3.8 mmol/L (ref 3.5–5.1)
SODIUM: 133 mmol/L — AB (ref 135–145)

## 2015-07-30 LAB — HEMOGLOBIN A1C: Hgb A1c MFr Bld: 6.3 % — ABNORMAL HIGH (ref 4.0–6.0)

## 2015-07-30 LAB — EXPECTORATED SPUTUM ASSESSMENT W REFEX TO RESP CULTURE

## 2015-07-30 LAB — GLUCOSE, CAPILLARY
GLUCOSE-CAPILLARY: 197 mg/dL — AB (ref 65–99)
Glucose-Capillary: 275 mg/dL — ABNORMAL HIGH (ref 65–99)
Glucose-Capillary: 243 mg/dL — ABNORMAL HIGH (ref 65–99)

## 2015-07-30 MED ORDER — INSULIN ASPART 100 UNIT/ML ~~LOC~~ SOLN
0.0000 [IU] | Freq: Three times a day (TID) | SUBCUTANEOUS | Status: DC
  Administered 2015-07-30: 2 [IU] via SUBCUTANEOUS
  Administered 2015-07-30: 5 [IU] via SUBCUTANEOUS
  Administered 2015-07-31: 3 [IU] via SUBCUTANEOUS
  Filled 2015-07-30: qty 5
  Filled 2015-07-30: qty 3
  Filled 2015-07-30: qty 2

## 2015-07-30 MED ORDER — INSULIN ASPART 100 UNIT/ML ~~LOC~~ SOLN
0.0000 [IU] | Freq: Every day | SUBCUTANEOUS | Status: DC
  Administered 2015-07-30: 2 [IU] via SUBCUTANEOUS
  Filled 2015-07-30: qty 2

## 2015-07-30 MED ORDER — IPRATROPIUM-ALBUTEROL 0.5-2.5 (3) MG/3ML IN SOLN
3.0000 mL | RESPIRATORY_TRACT | Status: DC
  Administered 2015-07-30 – 2015-07-31 (×4): 3 mL via RESPIRATORY_TRACT
  Filled 2015-07-30 (×6): qty 3

## 2015-07-30 MED ORDER — BUDESONIDE 0.25 MG/2ML IN SUSP
0.2500 mg | Freq: Two times a day (BID) | RESPIRATORY_TRACT | Status: DC
  Administered 2015-07-30 – 2015-07-31 (×3): 0.25 mg via RESPIRATORY_TRACT
  Filled 2015-07-30 (×3): qty 2

## 2015-07-30 MED ORDER — METHYLPREDNISOLONE SODIUM SUCC 40 MG IJ SOLR
40.0000 mg | Freq: Four times a day (QID) | INTRAMUSCULAR | Status: DC
  Administered 2015-07-30 – 2015-07-31 (×5): 40 mg via INTRAVENOUS
  Filled 2015-07-30 (×4): qty 1

## 2015-07-30 NOTE — Progress Notes (Signed)
[place] at [place] NAME: Karla Carter    MR#:  [id number]  DATE OF BIRTH:  1956/05/07  SUBJECTIVE:   Patient is here due to shortness of breath, wheezing from acute bronchitis and noted to be in mild COPD exacerbation. Still has a cough with some wheezing.  REVIEW OF SYSTEMS:    Review of Systems  Constitutional: Negative for fever and chills.  HENT: Negative for congestion and tinnitus.   Eyes: Negative for blurred vision and double vision.  Respiratory: Positive for cough, shortness of breath and wheezing.   Cardiovascular: Negative for chest pain, orthopnea and PND.  Gastrointestinal: Negative for nausea, vomiting, abdominal pain and diarrhea.  Genitourinary: Negative for dysuria and hematuria.  Neurological: Negative for dizziness, sensory change and focal weakness.  All other systems reviewed and are negative.   Nutrition: Heart Healthy Tolerating Diet: yes Tolerating PT: Ambulatory   DRUG ALLERGIES:   Allergies  Allergen Reactions  . Codeine Rash    VITALS:  Blood pressure 131/63, pulse 78, temperature 97.9 F (36.6 C), temperature source Oral, resp. rate 18, height 5\' 4"  (1.626 m), weight 73.936 kg (163 lb), SpO2 97 %.  PHYSICAL EXAMINATION:   Physical Exam  GENERAL:  60 y.o.-year-old patient lying in the bed in no acute distress.  EYES: Pupils equal, round, reactive to light and accommodation. No scleral icterus. Extraocular muscles intact.  HEENT: Head atraumatic, normocephalic. Oropharynx and nasopharynx clear.  NECK:  Supple, no jugular venous distention. No thyroid enlargement, no tenderness.  LUNGS: Good A/E b/l, b/l end-exp wheezing, rhonchi. NO rales. No use of accessory muscles of respiration.  CARDIOVASCULAR: S1, S2 normal. No murmurs, rubs, or gallops.  ABDOMEN: Soft, nontender, nondistended. Bowel sounds present. No organomegaly or mass.  EXTREMITIES: No cyanosis, clubbing or edema b/l.     NEUROLOGIC: Cranial nerves II through XII are intact. No focal Motor or sensory deficits b/l.   PSYCHIATRIC: The patient is alert and oriented x 3. Good affect.   SKIN: No obvious rash, lesion, or ulcer.    LABORATORY PANEL:   CBC  Recent Labs Lab 07/30/15 0618  WBC 17.7*  HGB 12.8  HCT 37.1  PLT 198   ------------------------------------------------------------------------------------------------------------------  Chemistries   Recent Labs Lab 07/29/15 1247 07/30/15 0618  NA 136 133*  K 3.2* 3.8  CL 102 105  CO2 22 22  GLUCOSE 172* 198*  BUN 10 9  CREATININE 0.87 0.71  CALCIUM 9.0 8.3*  AST 49*  --   ALT 71*  --   ALKPHOS 104  --   BILITOT 1.1  --    ------------------------------------------------------------------------------------------------------------------  Cardiac Enzymes No results for input(s): TROPONINI in the last 168 hours. ------------------------------------------------------------------------------------------------------------------  RADIOLOGY:  Dg Chest 2 View  07/29/2015  CLINICAL DATA:  Shortness of breath EXAM: CHEST  2 VIEW COMPARISON:  07/25/2015 FINDINGS: Stable bronchitic appearing interstitial coarsening. Mild hyperinflation correlating with COPD history. There is no edema, consolidation, effusion, or pneumothorax. Normal heart size and aortic contours. IMPRESSION: Bronchitic markings without focal pneumonia. Electronically Signed   By: Monte Fantasia M.D.   On: 07/29/2015 10:54     ASSESSMENT AND PLAN:   60 year old female with past medical history of COPD, hypertension, who presents to the hospital due to shortness of breath, wheezing and noted to be in COPD exacerbation.  #1 COPD exacerbation-this is due to acute bronchitis and also ongoing tobacco abuse. -Patient failed outpatient treatment with oral antibiotics and prednisone. -Continue IV steroids, DuoNeb  nebs around-the-clock, Pulmicort nebs, Zithromax. -Assess for home  oxygen prior to discharge.  #2 acute bronchitis-nonspecific and viral nature. -Continue Zithromax for now.  #3 tobacco abuse-continue nicotine patch.  #4 hypoglycemia-possibly steroid mediated. We'll check a hemoglobin A1c. -Continue sliding scale insulin for now.  #5 essential hypertension-continue Norvasc.  #6 GERD-continue Pepcid.   All the records are reviewed and case discussed with Care Management/Social Workerr. Management plans discussed with the patient, family and they are in agreement.  CODE STATUS: Full  DVT Prophylaxis: Lovenox  TOTAL TIME TAKING CARE OF THIS PATIENT: 25 minutes.   POSSIBLE D/C IN 1-2 DAYS, DEPENDING ON CLINICAL CONDITION.   Henreitta Leber M.D on 07/30/2015 at 2:03 PM  Between 7am to 6pm - Pager - [phone]  After 6pm go to [email or web address] - password [email or web address] [facility]  [facility]  Office  [phone]  CC: Primary care physician; No PCP Per Patient

## 2015-07-30 NOTE — Progress Notes (Signed)
Initial Nutrition Assessment     INTERVENTION:  Meals and snacks: Cater to pt preferences Medical Nutrition Supplement Therapy: Will add mightyshake BID for added nutrition  NUTRITION DIAGNOSIS:   Inadequate oral intake related to acute illness as evidenced by per patient/family report.    GOAL:   Patient will meet greater than or equal to 90% of their needs    MONITOR:    (Eenrgy intake)  REASON FOR ASSESSMENT:   Malnutrition Screening Tool    ASSESSMENT:      Pt admitted with shortness of breath, COPD exacerbation and acute bronchitis  Past Medical History  Diagnosis Date  . COPD (chronic obstructive pulmonary disease) ([place])   . Hypertension     Current Nutrition: ate 100% of breakfast this am and tolerated well  Food/Nutrition-Related History: pt reports decreased intake over the last week secondary to respiratory intake.   Scheduled Medications:  . amLODipine  10 mg Oral Daily  . azithromycin  250 mg Oral Daily  . budesonide (PULMICORT) nebulizer solution  0.25 mg Nebulization BID  . docusate sodium  100 mg Oral BID  . enoxaparin (LOVENOX) injection  40 mg Subcutaneous Q24H  . famotidine  20 mg Oral Daily  . insulin aspart  0-5 Units Subcutaneous QHS  . insulin aspart  0-9 Units Subcutaneous TID WC  . ipratropium-albuterol  3 mL Nebulization Q4H  . methylPREDNISolone (SOLU-MEDROL) injection  40 mg Intravenous Q6H  . nicotine  7 mg Transdermal Daily      Electrolyte/Renal Profile and Glucose Profile:   Recent Labs Lab 07/29/15 1247 07/30/15 0618  NA 136 133*  K 3.2* 3.8  CL 102 105  CO2 22 22  BUN 10 9  CREATININE 0.87 0.71  CALCIUM 9.0 8.3*  GLUCOSE 172* 198*   Protein Profile:  Recent Labs Lab 07/29/15 1247  ALBUMIN 4.1    Gastrointestinal Profile: Last BM: 2/13   Nutrition-Focused Physical Exam Findings: Nutrition-Focused physical exam completed. Findings are WDL for fat depletion, muscle depletion, and edema.      Weight Change: 4% wt loss in the last 4 days per wt encounters.  Pt reports wt loss of 13 pounds in the last week. Unsure accuracy of wt records??  Diet Order:  Diet 2 gram sodium Room service appropriate?: Yes; Fluid consistency:: Thin  Skin:   reviewed   Height:   Ht Readings from Last 1 Encounters:  07/29/15 5\' 4"  (1.626 m)    Weight:   Wt Readings from Last 1 Encounters:  07/29/15 163 lb (73.936 kg)    Ideal Body Weight:     BMI:  Body mass index is 27.97 kg/(m^2).  Estimated Nutritional Needs:   Kcal:  BEE 1300 kcals (IF 1.1-1.3, AF 1.3) [id number] kcals/d  Protein:  (1.0-1.2 g/kg) 74-89 g/d  Fluid:  (30-85ml/kg) 2220-2580ml/d  EDUCATION NEEDS:   No education needs identified at this time  [place]. Zenia Resides, [place], [place] (pager) Weekend/On-Call pager [phone])

## 2015-07-31 LAB — CBC
HCT: 38.9 % (ref 35.0–47.0)
HEMOGLOBIN: 13.2 g/dL (ref 12.0–16.0)
MCH: 29.2 pg (ref 26.0–34.0)
MCHC: 34 g/dL (ref 32.0–36.0)
MCV: 85.8 fL (ref 80.0–100.0)
Platelets: 250 10*3/uL (ref 150–440)
RBC: 4.53 MIL/uL (ref 3.80–5.20)
RDW: 13.9 % (ref 11.5–14.5)
WBC: 25.7 10*3/uL — AB (ref 3.6–11.0)

## 2015-07-31 LAB — GLUCOSE, CAPILLARY: GLUCOSE-CAPILLARY: 206 mg/dL — AB (ref 65–99)

## 2015-07-31 MED ORDER — MOMETASONE FURO-FORMOTEROL FUM 100-5 MCG/ACT IN AERO: 2.0000 | INHALATION_SPRAY | Freq: Two times a day (BID) | RESPIRATORY_TRACT | Status: AC

## 2015-07-31 MED ORDER — ALBUTEROL SULFATE HFA 108 (90 BASE) MCG/ACT IN AERS
2.0000 | INHALATION_SPRAY | RESPIRATORY_TRACT | Status: DC | PRN
  Filled 2015-07-31: qty 6.7

## 2015-07-31 MED ORDER — MOMETASONE FURO-FORMOTEROL FUM 100-5 MCG/ACT IN AERO
2.0000 | INHALATION_SPRAY | Freq: Two times a day (BID) | RESPIRATORY_TRACT | Status: DC
  Administered 2015-07-31: 2 via RESPIRATORY_TRACT
  Filled 2015-07-31: qty 8.8

## 2015-07-31 MED ORDER — ALBUTEROL SULFATE HFA 108 (90 BASE) MCG/ACT IN AERS: 2.0000 | INHALATION_SPRAY | RESPIRATORY_TRACT | Status: DC | PRN

## 2015-07-31 MED ORDER — PREDNISONE 10 MG PO TABS: ORAL_TABLET | ORAL | Status: DC

## 2015-07-31 NOTE — Discharge Summary (Signed)
[place] at [place] NAME: Karla Carter    MR#:  [id number]  DATE OF BIRTH:  1956/05/25  DATE OF ADMISSION:  07/29/2015 ADMITTING PHYSICIAN: Nicholes Mango, MD  DATE OF DISCHARGE: 07/31/2015  1:00 PM  PRIMARY CARE PHYSICIAN: No PCP Per Patient    ADMISSION DIAGNOSIS:  Nausea [R11.0] URI (upper respiratory infection) [J06.9] Hypoxia [R09.02] COPD exacerbation (HCC) [J44.1] Diarrhea, unspecified type [R19.7]  DISCHARGE DIAGNOSIS:  Active Problems:   Acute bronchitis with COPD ([place])   SECONDARY DIAGNOSIS:   Past Medical History  Diagnosis Date  . COPD (chronic obstructive pulmonary disease) ([place])   . Hypertension     HOSPITAL COURSE:   60 year old female with past medical history of COPD, hypertension, who presents to the hospital due to shortness of breath, wheezing and noted to be in COPD exacerbation.  #1 COPD exacerbation-this was due to acute bronchitis and also ongoing tobacco abuse. -Patient failed outpatient treatment with oral antibiotics and prednisone prior to admission -Patient was treated with IV steroids, Pulmicort nebs, DuoNeb nebs around-the-clock, Zithromax. -She has significantly improved since admission and therefore not being discharged on oral prednisone taper, albuterol inhaler, Dulera, Zithromax -She was ambulated on room air and did not qualify for home oxygen. She was strongly advised to quit smoking.  #2 acute bronchitis-nonspecific and viral nature. -Patient will finish her Zithromax course.  #3 tobacco abuse-while in the hospital she was maintained on nicotine patch. She was strongly advised to quit smoking.  #4 hyperglycemia-this was likely steroid mediated. Her hemoglobin A1c was 6.3. She probably may have underlying prediabetes. -She should follow up with a primary care physician for further workup as outpatient  #5 essential hypertension-she will continue Norvasc.   DISCHARGE CONDITIONS:    Stable  CONSULTS OBTAINED:     DRUG ALLERGIES:   Allergies  Allergen Reactions  . Codeine Rash    DISCHARGE MEDICATIONS:   Discharge Medication List as of 07/31/2015 11:13 AM    START taking these medications   Details  loperamide (LOPERAMIDE A-D) 2 MG tablet Take 1 tablet (2 mg total) by mouth 4 (four) times daily as needed for diarrhea or loose stools., Starting 07/29/2015, Until Discontinued, Print    mometasone-formoterol (DULERA) 100-5 MCG/ACT AERO Inhale 2 puffs into the lungs 2 (two) times daily., Starting 07/31/2015, Until Discontinued, Normal      CONTINUE these medications which have CHANGED   Details  azithromycin (ZITHROMAX) 250 MG tablet Take 1 tablet (250 mg total) by mouth daily. Take 1 tablet daily for 3 days., Starting 07/29/2015, Until Thu 08/01/15, Print    predniSONE (DELTASONE) 10 MG tablet Label  & dispense according to the schedule below. 5 Pills PO for 1 day then, 4 Pills PO for 1 day, 3 Pills PO for 1 day, 2 Pills PO for 1 day, 1 Pill PO for 1 days then STOP., Print      CONTINUE these medications which have NOT CHANGED   Details  albuterol (PROVENTIL HFA;VENTOLIN HFA) 108 (90 Base) MCG/ACT inhaler Inhale 2 puffs into the lungs every 4 (four) hours as needed for wheezing or shortness of breath., Starting 07/25/2015, Until Discontinued, Print    amLODipine (NORVASC) 10 MG tablet Take 10 mg by mouth daily., Until Discontinued, Historical Med         DISCHARGE INSTRUCTIONS:   DIET:  Cardiac diet  DISCHARGE CONDITION:  Stable  ACTIVITY:  Activity as tolerated  OXYGEN:  Home Oxygen: No.   Oxygen Delivery:  room air  DISCHARGE LOCATION:  home   If you experience worsening of your admission symptoms, develop shortness of breath, life threatening emergency, suicidal or homicidal thoughts you must seek medical attention immediately by calling 911 or calling your MD immediately  if symptoms less severe.  You Must read complete  instructions/literature along with all the possible adverse reactions/side effects for all the Medicines you take and that have been prescribed to you. Take any new Medicines after you have completely understood and accpet all the possible adverse reactions/side effects.   Please note  You were cared for by a hospitalist during your hospital stay. If you have any questions about your discharge medications or the care you received while you were in the hospital after you are discharged, you can call the unit and asked to speak with the hospitalist on call if the hospitalist that took care of you is not available. Once you are discharged, your primary care physician will handle any further medical issues. Please note that NO REFILLS for any discharge medications will be authorized once you are discharged, as it is imperative that you return to your primary care physician (or establish a relationship with a primary care physician if you do not have one) for your aftercare needs so that they can reassess your need for medications and monitor your lab values.     Today   Shortness of breath, wheezing and bronchospasm much improved. No other complaints.  VITAL SIGNS:  Blood pressure 142/70, pulse 66, temperature 97.8 F (36.6 C), temperature source Oral, resp. rate 20, height 5\' 4"  (1.626 m), weight 73.936 kg (163 lb), SpO2 94 %.  I/O:   Intake/Output Summary (Last 24 hours) at 07/31/15 1718 Last data filed at 07/31/15 0900  Gross per 24 hour  Intake    480 ml  Output    775 ml  Net   -295 ml    PHYSICAL EXAMINATION:  GENERAL:  60 y.o.-year-old patient lying in the bed with no acute distress.  EYES: Pupils equal, round, reactive to light and accommodation. No scleral icterus. Extraocular muscles intact.  HEENT: Head atraumatic, normocephalic. Oropharynx and nasopharynx clear.  NECK:  Supple, no jugular venous distention. No thyroid enlargement, no tenderness.  LUNGS: Normal breath sounds  bilaterally, minimal end-exp. Wheezing b/l, No rales,rhonchi. No use of accessory muscles of respiration.  CARDIOVASCULAR: S1, S2 normal. No murmurs, rubs, or gallops.  ABDOMEN: Soft, non-tender, non-distended. Bowel sounds present. No organomegaly or mass.  EXTREMITIES: No pedal edema, cyanosis, or clubbing.  NEUROLOGIC: Cranial nerves II through XII are intact. No focal motor or sensory defecits b/l.  PSYCHIATRIC: The patient is alert and oriented x 3. Good affect.  SKIN: No obvious rash, lesion, or ulcer.   DATA REVIEW:   CBC  Recent Labs Lab 07/31/15 0539  WBC 25.7*  HGB 13.2  HCT 38.9  PLT 250    Chemistries   Recent Labs Lab 07/29/15 1247 07/30/15 0618  NA 136 133*  K 3.2* 3.8  CL 102 105  CO2 22 22  GLUCOSE 172* 198*  BUN 10 9  CREATININE 0.87 0.71  CALCIUM 9.0 8.3*  AST 49*  --   ALT 71*  --   ALKPHOS 104  --   BILITOT 1.1  --     Cardiac Enzymes No results for input(s): TROPONINI in the last 168 hours.  Microbiology Results  Results for orders placed or performed during the hospital encounter of 07/29/15  Rapid Influenza A&B Antigens [facility]  only)     Status: None   Collection Time: 07/29/15 12:46 PM  Result Value Ref Range Status   Influenza A ([place]) NEGATIVE  Final   Influenza B ([facility]) NEGATIVE  Final  Culture, sputum-assessment     Status: None   Collection Time: 07/30/15  4:26 AM  Result Value Ref Range Status   Specimen Description SPUTUM  Final   Special Requests NONE  Final   Sputum evaluation THIS SPECIMEN IS ACCEPTABLE FOR SPUTUM CULTURE  Final   Report Status 07/30/2015 FINAL  Final  Culture, respiratory (NON-Expectorated)     Status: None (Preliminary result)   Collection Time: 07/30/15  4:26 AM  Result Value Ref Range Status   Specimen Description SPUTUM  Final   Special Requests NONE Reflexed from [id number]  Final   Gram Stain   Final    MODERATE WBC SEEN RARE SQUAMOUS EPITHELIAL CELLS PRESENT NO ORGANISMS SEEN EXCELLENT SPECIMEN -  90-100% WBCS    Culture HOLDING FOR POSSIBLE PATHOGEN  Final   Report Status PENDING  Incomplete    RADIOLOGY:  No results found.    Management plans discussed with the patient, family and they are in agreement.  CODE STATUS:  Code Status History    Date Active Date Inactive Code Status Order ID Comments User Context   07/29/2015  6:28 PM 07/31/2015  4:23 PM Full Code [id number]  Nicholes Mango, MD Inpatient      TOTAL TIME TAKING CARE OF THIS PATIENT: 40 minutes.    Henreitta Leber M.D on 07/31/2015 at 5:18 PM  Between 7am to 6pm - Pager - [phone]  After 6pm go to [email or web address] - password [email or web address] [facility]  [facility]  Office  [phone]  CC: Primary care physician; No PCP Per Patient

## 2015-07-31 NOTE — Progress Notes (Signed)
[place]    Kerston Hartkopf was admitted to the Hospital on 07/29/2015 and Discharged  07/31/2015 and should be excused from work/school   for  7  days starting 07/29/2015 , may return to work/school without any restrictions.  Call Abel Presto MD, [facility]  [phone] with questions.  Henreitta Leber M.D on 07/31/2015,at 12:40 PM

## 2015-07-31 NOTE — Care Management Note (Signed)
Case Management Note  Patient Details  Name: Karla Carter MRN: [id number] Date of Birth: 03/30/1956  Subjective/Objective:                 Patient admitted with Acute respiratory distress secondary to COPD exacerbation with acute bronchitis. Patient lives at home with her adult son and boyfriend.  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  Plan for patient to discharge today with new medications.  Patient was provided with applications to Medication [facility] Management, and [facility].  Patient was provided with coupons from goodrx.com for all new medications at time of discharge.   Patient states that she will be able to afford all of her medications except of the inhalers.  Patient to be discharged with inhalers that were used in the hospital.  I stressed to the patient the importance to follow through with the applications to Medication Management in order to continue to receive assistance with medications outpatient    Action/Plan:   Expected Discharge Date:                  Expected Discharge Plan:     In-House Referral:     Discharge planning Services     Post Acute Care Choice:    Choice offered to:     DME Arranged:    DME Agency:     HH Arranged:    [place] Agency:     Status of Service:     Medicare Important Message Given:    Date Medicare IM Given:    Medicare IM give by:    Date Additional Medicare IM Given:    Additional Medicare Important Message give by:     If discussed at [place] of Stay Meetings, dates discussed:    Additional Comments:  Beverly Sessions, RN 07/31/2015, 1:11 PM

## 2015-07-31 NOTE — Discharge Instructions (Signed)
Be sure to take the full regimen of antibiotics.  Do not stop the Prednisone abruptly, taper as directed.

## 2015-07-31 NOTE — Progress Notes (Signed)
MD ordered patient to be discharged home.  Discharge instructions were reviewed with the patient and she voiced understanding.  Prescription was given to the patient.  Patient was given the two inhalers to take home.  Follow-up appointment was made.  IV was removed with catheter intact.  All questions were answered.  Patient left via wheelchair escorted by auxillary.

## 2015-08-02 LAB — CULTURE, RESPIRATORY

## 2016-08-03 ENCOUNTER — Encounter: Payer: Self-pay | Admitting: Emergency Medicine

## 2016-08-03 ENCOUNTER — Emergency Department: Payer: Self-pay

## 2016-08-03 ENCOUNTER — Emergency Department
Admission: EM | Admit: 2016-08-03 | Discharge: 2016-08-03 | Disposition: A | Discharge status: Home / Self Care | Payer: Self-pay | Attending: Emergency Medicine | Admitting: Emergency Medicine

## 2016-08-03 DIAGNOSIS — Z79899 Other long term (current) drug therapy: Secondary | ICD-10-CM | POA: Insufficient documentation

## 2016-08-03 DIAGNOSIS — J449 Chronic obstructive pulmonary disease, unspecified: Secondary | ICD-10-CM | POA: Insufficient documentation

## 2016-08-03 DIAGNOSIS — R197 Diarrhea, unspecified: Secondary | ICD-10-CM | POA: Insufficient documentation

## 2016-08-03 DIAGNOSIS — F17210 Nicotine dependence, cigarettes, uncomplicated: Secondary | ICD-10-CM | POA: Insufficient documentation

## 2016-08-03 DIAGNOSIS — R1030 Lower abdominal pain, unspecified: Secondary | ICD-10-CM | POA: Insufficient documentation

## 2016-08-03 DIAGNOSIS — I10 Essential (primary) hypertension: Secondary | ICD-10-CM | POA: Insufficient documentation

## 2016-08-03 LAB — CBC
HEMATOCRIT: 47.7 % — AB (ref 35.0–47.0)
HEMOGLOBIN: 16.1 g/dL — AB (ref 12.0–16.0)
MCH: 29 pg (ref 26.0–34.0)
MCHC: 33.6 g/dL (ref 32.0–36.0)
MCV: 86.1 fL (ref 80.0–100.0)
Platelets: 259 10*3/uL (ref 150–440)
RBC: 5.55 MIL/uL — AB (ref 3.80–5.20)
RDW: 13.5 % (ref 11.5–14.5)
WBC: 23.7 10*3/uL — AB (ref 3.6–11.0)

## 2016-08-03 LAB — COMPREHENSIVE METABOLIC PANEL
ALBUMIN: 5 g/dL (ref 3.5–5.0)
ALT: 25 U/L (ref 14–54)
ANION GAP: 10 (ref 5–15)
AST: 24 U/L (ref 15–41)
Alkaline Phosphatase: 90 U/L (ref 38–126)
BUN: 18 mg/dL (ref 6–20)
CO2: 23 mmol/L (ref 22–32)
Calcium: 9.9 mg/dL (ref 8.9–10.3)
Chloride: 105 mmol/L (ref 101–111)
Creatinine, Ser: 1.11 mg/dL — ABNORMAL HIGH (ref 0.44–1.00)
GFR, EST NON AFRICAN AMERICAN: 53 mL/min — AB (ref >60)
GLUCOSE: 110 mg/dL — AB (ref 65–99)
POTASSIUM: 4.1 mmol/L (ref 3.5–5.1)
Sodium: 138 mmol/L (ref 135–145)
TOTAL PROTEIN: 8.3 g/dL — AB (ref 6.5–8.1)
Total Bilirubin: 0.6 mg/dL (ref 0.3–1.2)

## 2016-08-03 LAB — URINALYSIS, COMPLETE (UACMP) WITH MICROSCOPIC
BILIRUBIN URINE: NEGATIVE
Glucose, UA: NEGATIVE mg/dL
Hgb urine dipstick: NEGATIVE
Ketones, ur: 5 mg/dL — AB
LEUKOCYTES UA: NEGATIVE
NITRITE: NEGATIVE
PH: 5 (ref 5.0–8.0)
Protein, ur: 30 mg/dL — AB
SPECIFIC GRAVITY, URINE: 1.025 (ref 1.005–1.030)

## 2016-08-03 LAB — LIPASE, BLOOD: Lipase: 29 U/L (ref 11–51)

## 2016-08-03 MED ORDER — IOPAMIDOL (ISOVUE-300) INJECTION 61%
100.0000 mL | Freq: Once | INTRAVENOUS | Status: AC | PRN
  Administered 2016-08-03: 100 mL via INTRAVENOUS

## 2016-08-03 MED ORDER — MORPHINE SULFATE (PF) 4 MG/ML IV SOLN
4.0000 mg | Freq: Once | INTRAVENOUS | Status: AC
  Administered 2016-08-03: 4 mg via INTRAVENOUS
  Filled 2016-08-03: qty 1

## 2016-08-03 MED ORDER — ONDANSETRON 4 MG PO TBDP: 4.0000 mg | ORAL_TABLET | Freq: Three times a day (TID) | ORAL | 0 refills | Status: DC | PRN

## 2016-08-03 MED ORDER — SODIUM CHLORIDE 0.9 % IV BOLUS (SEPSIS)
1000.0000 mL | Freq: Once | INTRAVENOUS | Status: AC
  Administered 2016-08-03: 1000 mL via INTRAVENOUS

## 2016-08-03 MED ORDER — IOPAMIDOL (ISOVUE-300) INJECTION 61%
30.0000 mL | Freq: Once | INTRAVENOUS | Status: AC | PRN
  Administered 2016-08-03: 30 mL via ORAL

## 2016-08-03 MED ORDER — ONDANSETRON HCL 4 MG/2ML IJ SOLN
4.0000 mg | Freq: Once | INTRAMUSCULAR | Status: AC
  Administered 2016-08-03: 4 mg via INTRAVENOUS
  Filled 2016-08-03: qty 2

## 2016-08-03 NOTE — ED Triage Notes (Addendum)
Pt c/o lower abdominal pain with nausea and diarrhea; no vomiting.  Denies fevers.  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  Has also had vaginal bleeding. No distress now. Abdomen soft. Color WNL

## 2016-08-03 NOTE — ED Provider Notes (Signed)
[facility] Emergency Department Provider Note  Time seen: 7:41 PM  I have reviewed the triage vital signs and the nursing notes.   HISTORY  Chief Complaint Abdominal Pain    HPI Karla Carter is a 61 y.o. female with a past medical history of COPD, hypertension, diverticulitis status post colostomy with colostomy reversal approximate 7 years ago who presents to the emergency department for lower abdominal pain since yesterday. According to the patient since yesterday she has been experiencing intermittent lower abdominal pain which has become more severe and consistent today. Describes as moderate dull aching pain in the lower abdomen. Patient has been nauseated but denies vomiting, states 1 or 2 small loose stools. Denies any fever or dysuria.  Past Medical History:  Diagnosis Date  . COPD (chronic obstructive pulmonary disease) ([place])   . Hypertension     Patient Active Problem List   Diagnosis Date Noted  . Acute bronchitis with COPD ([place]) 07/29/2015    Past Surgical History:  Procedure Laterality Date  . ABDOMINAL HYSTERECTOMY    . APPENDECTOMY    . COLON SURGERY    . TONSILLECTOMY      Prior to Admission medications   Medication Sig Start Date End Date Taking? Authorizing Provider  albuterol (PROVENTIL HFA;VENTOLIN HFA) 108 (90 Base) MCG/ACT inhaler Inhale 2 puffs into the lungs every 4 (four) hours as needed for wheezing or shortness of breath. 07/25/15   Charline Bills Cuthriell, PA-C  amLODipine (NORVASC) 10 MG tablet Take 10 mg by mouth daily.    Historical Provider, MD  loperamide (LOPERAMIDE A-D) 2 MG tablet Take 1 tablet (2 mg total) by mouth 4 (four) times daily as needed for diarrhea or loose stools. 07/29/15   Anne-Caroline Mariea Clonts, MD  mometasone-formoterol (DULERA) 100-5 MCG/ACT AERO Inhale 2 puffs into the lungs 2 (two) times daily. 07/31/15   Henreitta Leber, MD  predniSONE (DELTASONE) 10 MG tablet Label  & dispense according to the schedule  below. 5 Pills PO for 1 day then, 4 Pills PO for 1 day, 3 Pills PO for 1 day, 2 Pills PO for 1 day, 1 Pill PO for 1 days then STOP. 07/31/15   Henreitta Leber, MD    Allergies  Allergen Reactions  . Codeine Rash    History reviewed. No pertinent family history.  Social History Social History  Substance Use Topics  . Smoking status: Current Every Day Smoker    Packs/day: 1.00    Types: Cigarettes  . Smokeless tobacco: Never Used  . Alcohol use No    Review of Systems Constitutional: Negative for fever. Cardiovascular: Negative for chest pain. Respiratory: Negative for shortness of breath. Gastrointestinal: Positive for lower abdominal pain Genitourinary: Negative for dysuria. Neurological: Negative for headache 10-point ROS otherwise negative.  ____________________________________________   PHYSICAL EXAM:  VITAL SIGNS: ED Triage Vitals  Enc Vitals Group     BP 08/03/16 1842 (!) 148/74     Pulse Rate 08/03/16 1840 92     Resp 08/03/16 1840 18     Temp 08/03/16 1840 98.3 F (36.8 C)     Temp Source 08/03/16 1840 Oral     SpO2 08/03/16 1840 97 %     Weight 08/03/16 1840 170 lb (77.1 kg)     Height 08/03/16 1840 5\' 4"  (1.626 m)     Head Circumference --      Peak Flow --      Pain Score 08/03/16 1840 5  Pain Loc --      Pain Edu? --      Excl. in [place]? --     Constitutional: Alert and oriented. Well appearing and in no distress. Eyes: Normal exam ENT   Head: Normocephalic and atraumatic   Mouth/Throat: Mucous membranes are moist. Cardiovascular: Normal rate, regular rhythm. No murmur Respiratory: Normal respiratory effort without tachypnea nor retractions. Breath sounds are clear Gastrointestinal: Soft, moderate lower abdominal tenderness palpation especially in the left lower quadrant, no rebound or guarding. No distention. Musculoskeletal: Nontender with normal range of motion in all extremities.  Neurologic:  Normal speech and language. No gross  focal neurologic deficits Skin:  Skin is warm, dry and intact.  Psychiatric: Mood and affect are normal.   ____________________________________________   RADIOLOGY  IMPRESSION: 1. No acute abnormality seen within the abdomen or pelvis. 2. Cholelithiasis. Gallbladder otherwise unremarkable. 3. 1.3 cm left adrenal nodule noted. Would correlate with adrenal labs, and consider adrenal protocol MRI or CT for further evaluation, when and as deemed clinically appropriate. 4. Small bilateral renal cysts seen. 5. Scattered aortic atherosclerosis.  ____________________________________________   INITIAL IMPRESSION / ASSESSMENT AND PLAN / ED COURSE  Pertinent labs & imaging results that were available during my care of the patient were reviewed by me and considered in my medical decision making (see chart for details).  Patient presents with lower abdominal pain since yesterday along with nausea. Patient has moderate tenderness to palpation. Labs show a significant leukocytosis otherwise largely within normal limits. We'll proceed with a CT abdomen/pelvis to further evaluate. We will treat the patient's pain and nausea as well as IV hydrate while awaiting results.  Patient CT scan is largely negative. Patient states her pain is pretty much gone at this point. I did discuss the 1.3 cm adrenal node with the patient for PCP follow-up. I also discussed very strict return precautions with the patient for any return or increased abdominal pain, fever or increase in bowel movements. Patient is agreeable otherwise she'll follow-up with her primary care doctor this week for recheck and return blood work.  ____________________________________________   FINAL CLINICAL IMPRESSION(S) / ED DIAGNOSES  Abdominal pain Diarrhea    Harvest Dark, MD 08/03/16 2108

## 2016-08-03 NOTE — ED Notes (Signed)
Patient transported to CT 

## 2016-08-03 NOTE — Discharge Instructions (Signed)
As we discussed please follow-up with her primary care doctor in the next several days for recheck/reevaluation. Return to the emergency department if you have any abdominal pain, develop fever, or significant increase in bowel movements. Please take your prescribed nausea medication as needed for nausea. As we discussed your CT scan also showed an adrenal node. Please follow-up with her primary care doctor for further imaging/recheck. CT results are below. IMPRESSION: 1. No acute abnormality seen within the abdomen or pelvis. 2. Cholelithiasis.  Gallbladder otherwise unremarkable. 3. 1.3 cm left adrenal nodule noted. Would correlate with adrenal labs, and consider adrenal protocol MRI or CT for further evaluation, when and as deemed clinically appropriate. 4. Small bilateral renal cysts seen. 5. Scattered aortic atherosclerosis.

## 2016-12-23 ENCOUNTER — Encounter: Payer: Self-pay | Admitting: Emergency Medicine

## 2016-12-23 ENCOUNTER — Emergency Department
Admission: EM | Admit: 2016-12-23 | Discharge: 2016-12-23 | Disposition: A | Discharge status: Home / Self Care | Payer: Self-pay | Attending: Student in an Organized Health Care Education/Training Program | Admitting: Student in an Organized Health Care Education/Training Program

## 2016-12-23 ENCOUNTER — Emergency Department: Payer: Self-pay

## 2016-12-23 DIAGNOSIS — F17210 Nicotine dependence, cigarettes, uncomplicated: Secondary | ICD-10-CM | POA: Insufficient documentation

## 2016-12-23 DIAGNOSIS — J449 Chronic obstructive pulmonary disease, unspecified: Secondary | ICD-10-CM | POA: Insufficient documentation

## 2016-12-23 DIAGNOSIS — M25551 Pain in right hip: Secondary | ICD-10-CM | POA: Insufficient documentation

## 2016-12-23 DIAGNOSIS — I10 Essential (primary) hypertension: Secondary | ICD-10-CM | POA: Insufficient documentation

## 2016-12-23 DIAGNOSIS — Z79899 Other long term (current) drug therapy: Secondary | ICD-10-CM | POA: Insufficient documentation

## 2016-12-23 DIAGNOSIS — Z7951 Long term (current) use of inhaled steroids: Secondary | ICD-10-CM | POA: Insufficient documentation

## 2016-12-23 MED ORDER — TRAMADOL HCL 50 MG PO TABS: 50.0000 mg | ORAL_TABLET | Freq: Four times a day (QID) | ORAL | 0 refills | Status: DC | PRN

## 2016-12-23 MED ORDER — ONDANSETRON 4 MG PO TBDP
4.0000 mg | ORAL_TABLET | Freq: Once | ORAL | Status: AC
  Administered 2016-12-23: 4 mg via ORAL
  Filled 2016-12-23: qty 1

## 2016-12-23 MED ORDER — TRAMADOL HCL 50 MG PO TABS
50.0000 mg | ORAL_TABLET | Freq: Once | ORAL | Status: AC
  Administered 2016-12-23: 50 mg via ORAL
  Filled 2016-12-23: qty 1

## 2016-12-23 MED ORDER — KETOROLAC TROMETHAMINE 30 MG/ML IJ SOLN
30.0000 mg | Freq: Once | INTRAMUSCULAR | Status: AC
  Administered 2016-12-23: 30 mg via INTRAMUSCULAR
  Filled 2016-12-23: qty 1

## 2016-12-23 MED ORDER — MELOXICAM 7.5 MG PO TABS: 7.5000 mg | ORAL_TABLET | Freq: Every day | ORAL | 2 refills | Status: AC

## 2016-12-23 NOTE — ED Provider Notes (Signed)
[facility] Emergency Department Provider Note  ____________________________________________   First MD Initiated Contact with Patient 12/23/16 1101     (approximate)  I have reviewed the triage vital signs and the nursing notes.   HISTORY  Chief Complaint Hip Pain   HPI Karla Carter is a 60 y.o. female is brought in today by family member with complaint of right hip pain for approximately one week. Patient denies any history of injury. She states that any movement including standing or sitting increases her hip pain. She has taken over-the-counter medication without any relief of her pain. She denies any back pain associated with her hip pain. This morning pain was increased and patient was nauseous. Patient routinely takes blood pressure medication but did not take it this morning due to her nausea and pain. She rates her pain as a 10 over 10.   Past Medical History:  Diagnosis Date  . COPD (chronic obstructive pulmonary disease) ([place])   . Hypertension     Patient Active Problem List   Diagnosis Date Noted  . Acute bronchitis with COPD ([place]) 07/29/2015    Past Surgical History:  Procedure Laterality Date  . ABDOMINAL HYSTERECTOMY    . APPENDECTOMY    . COLON SURGERY    . TONSILLECTOMY      Prior to Admission medications   Medication Sig Start Date End Date Taking? Authorizing Provider  albuterol (PROVENTIL HFA;VENTOLIN HFA) 108 (90 Base) MCG/ACT inhaler Inhale 2 puffs into the lungs every 4 (four) hours as needed for wheezing or shortness of breath. 07/25/15   Cuthriell, Charline Bills, PA-C  amLODipine (NORVASC) 10 MG tablet Take 10 mg by mouth daily.    [provider]  loperamide (LOPERAMIDE A-D) 2 MG tablet Take 1 tablet (2 mg total) by mouth 4 (four) times daily as needed for diarrhea or loose stools. 07/29/15   Eula Listen, MD  meloxicam (MOBIC) 7.5 MG tablet Take 1 tablet (7.5 mg total) by mouth daily. 12/23/16 12/23/17   Johnn Hai, PA-C  mometasone-formoterol (DULERA) 100-5 MCG/ACT AERO Inhale 2 puffs into the lungs 2 (two) times daily. 07/31/15   Henreitta Leber, MD  ondansetron (ZOFRAN ODT) 4 MG disintegrating tablet Take 1 tablet (4 mg total) by mouth every 8 (eight) hours as needed for nausea or vomiting. 08/03/16   Harvest Dark, MD  predniSONE (DELTASONE) 10 MG tablet Label  & dispense according to the schedule below. 5 Pills PO for 1 day then, 4 Pills PO for 1 day, 3 Pills PO for 1 day, 2 Pills PO for 1 day, 1 Pill PO for 1 days then STOP. 07/31/15   Henreitta Leber, MD  traMADol (ULTRAM) 50 MG tablet Take 1 tablet (50 mg total) by mouth every 6 (six) hours as needed. 12/23/16   Johnn Hai, PA-C    Allergies Codeine  No family history on file.  Social History Social History  Substance Use Topics  . Smoking status: Current Every Day Smoker    Packs/day: 1.00    Types: Cigarettes  . Smokeless tobacco: Never Used  . Alcohol use No    Review of Systems  Constitutional: No fever/chills Cardiovascular: Denies chest pain. Respiratory: Denies shortness of breath. Gastrointestinal: No abdominal pain.  Positive nausea, no vomiting.   Genitourinary: Negative for dysuria. Musculoskeletal: Positive for right hip pain. Skin: Negative for rash. Neurological: Negative for headaches, focal weakness or numbness.   ____________________________________________   PHYSICAL EXAM:  VITAL SIGNS: ED Triage  Vitals  Enc Vitals Group     BP 12/23/16 1035 (!) 154/111     Pulse Rate 12/23/16 1035 68     Resp 12/23/16 1035 16     Temp 12/23/16 1035 97.9 F (36.6 C)     Temp Source 12/23/16 1035 Oral     SpO2 12/23/16 1035 98 %     Weight 12/23/16 1036 160 lb (72.6 kg)     Height 12/23/16 1036 5\' 4"  (1.626 m)     Head Circumference --      Peak Flow --      Pain Score 12/23/16 1035 10     Pain Loc --      Pain Edu? --      Excl. in [place]? --     Constitutional: Alert and oriented. Well  appearing and in no acute distress. Eyes: Conjunctivae are normal.  Head: Atraumatic. Nose: No congestion/rhinnorhea. Neck: No stridor.   Cardiovascular: Normal rate, regular rhythm. Grossly normal heart sounds.  Good peripheral circulation. Respiratory: Normal respiratory effort.  No retractions. Lungs CTAB. Gastrointestinal: Soft and nontender. No distention.  Musculoskeletal: There is moderate tenderness on palpation of the lateral and posterior right hip. No gross deformity was noted. No skin discoloration. There is increased pain with range of motion in any plane. There is no shortening of the extremity or rotation. There is no warmth, redness, or soft tissue edema present on exam. Point tenderness to the hip only. Neurologic:  Normal speech and language. No gross focal neurologic deficits are appreciated.  Skin:  Skin is warm, dry and intact. No rash noted. Psychiatric: Mood and affect are normal. Speech and behavior are normal.  ____________________________________________   LABS (all labs ordered are listed, but only abnormal results are displayed)  Labs Reviewed - No data to display RADIOLOGY  Dg Hip Unilat W Or Wo Pelvis 2-3 Views Right  Result Date: 12/23/2016 CLINICAL DATA:  Right hip pain for 1 week.  No known injury. EXAM: DG HIP (WITH OR WITHOUT PELVIS) 2-3V RIGHT COMPARISON:  None. FINDINGS: There is no evidence of hip fracture or dislocation. There is no evidence of arthropathy or other focal bone abnormality. IMPRESSION: No acute osseous injury of the right hip. Electronically Signed   By: Kathreen Devoid   On: 12/23/2016 12:13    ____________________________________________   PROCEDURES  Procedure(s) performed: None  Procedures  Critical Care performed: No  ____________________________________________   INITIAL IMPRESSION / ASSESSMENT AND PLAN / ED COURSE  Pertinent labs & imaging results that were available during my care of the patient were reviewed by me  and considered in my medical decision making (see chart for details).  Patient was reassured that x-ray did not show any fracture. Patient was given Zofran initially for nausea and this improved she was given tramadol and Toradol IM. Patient was discharged with prescription for meloxicam 7.5 one daily with food and tramadol as needed for severe pain. She is to follow-up with one of the clinics listed on her discharge papers as she states she does not have a PCP. She is also encouraged to take her blood pressure medication when she gets home as it was elevated in the department.    ___________________________________________   FINAL CLINICAL IMPRESSION(S) / ED DIAGNOSES  Final diagnoses:  Right hip pain      NEW MEDICATIONS STARTED DURING THIS VISIT:  New Prescriptions   MELOXICAM (MOBIC) 7.5 MG TABLET    Take 1 tablet (7.5 mg total) by mouth daily.  TRAMADOL (ULTRAM) 50 MG TABLET    Take 1 tablet (50 mg total) by mouth every 6 (six) hours as needed.     Note:  This document was prepared using Dragon voice recognition software and may include unintentional dictation errors.    Johnn Hai, PA-C 12/23/16 1317    Merlyn Lot, MD 12/23/16 1345

## 2016-12-23 NOTE — ED Notes (Signed)
Discussed discharge instructions, prescriptions, and follow-up care with patient. No questions or concerns at this time. Pt stable at discharge.  

## 2016-12-23 NOTE — Discharge Instructions (Signed)
You will need to follow up with the doctors listed on your discharge papers and have your blood pressure rechecked. Continue taking your blood pressure medication daily. Also begin taking meloxicam daily with food. Tramadol as needed for pain. Be aware this medication could cause drowsiness and increase your  risk for falling.

## 2016-12-23 NOTE — ED Triage Notes (Signed)
Pt reports right hip pain x1 week, denies injury. Pt reports pain increased last night, worse this AM. Pt has been vomiting due to the hip pain.

## 2018-06-29 ENCOUNTER — Ambulatory Visit: Payer: Self-pay

## 2018-07-05 ENCOUNTER — Other Ambulatory Visit: Payer: Self-pay

## 2018-07-05 ENCOUNTER — Encounter: Payer: Self-pay | Admitting: Gerontology

## 2018-07-05 ENCOUNTER — Ambulatory Visit: Payer: Self-pay | Admitting: Gerontology

## 2018-07-05 VITALS — BP 160/96 | HR 71 | Ht 64.0 in | Wt 147.7 lb

## 2018-07-05 DIAGNOSIS — Z8709 Personal history of other diseases of the respiratory system: Secondary | ICD-10-CM

## 2018-07-05 DIAGNOSIS — I10 Essential (primary) hypertension: Secondary | ICD-10-CM

## 2018-07-05 DIAGNOSIS — Z7689 Persons encountering health services in other specified circumstances: Secondary | ICD-10-CM

## 2018-07-05 DIAGNOSIS — D1722 Benign lipomatous neoplasm of skin and subcutaneous tissue of left arm: Secondary | ICD-10-CM

## 2018-07-05 DIAGNOSIS — L989 Disorder of the skin and subcutaneous tissue, unspecified: Secondary | ICD-10-CM

## 2018-07-05 DIAGNOSIS — Z8659 Personal history of other mental and behavioral disorders: Secondary | ICD-10-CM

## 2018-07-05 MED ORDER — ALBUTEROL SULFATE HFA 108 (90 BASE) MCG/ACT IN AERS: 2.0000 | INHALATION_SPRAY | RESPIRATORY_TRACT | 0 refills | Status: DC | PRN

## 2018-07-05 MED ORDER — TRIAMCINOLONE ACETONIDE 0.1 % EX CREA: 1.0000 "application " | TOPICAL_CREAM | Freq: Two times a day (BID) | CUTANEOUS | 0 refills | Status: DC

## 2018-07-05 MED ORDER — AMLODIPINE BESYLATE 10 MG PO TABS: 10.0000 mg | ORAL_TABLET | Freq: Every day | ORAL | 0 refills | Status: DC

## 2018-07-05 MED ORDER — SERTRALINE HCL 50 MG PO TABS: 50.0000 mg | ORAL_TABLET | Freq: Every day | ORAL | 0 refills | Status: DC

## 2018-07-05 NOTE — Patient Instructions (Signed)
Smoking Tobacco Information, Adult Smoking tobacco can be harmful to your health. Tobacco contains a poisonous (toxic), colorless chemical called nicotine. Nicotine is addictive. It changes the brain and can make it hard to stop smoking. Tobacco also has other toxic chemicals that can hurt your body and raise your risk of many cancers. How can smoking tobacco affect me? Smoking tobacco puts you at risk for:  Cancer. Smoking is most commonly associated with lung cancer, but can also lead to cancer in other parts of the body.  Chronic obstructive pulmonary disease (COPD). This is a long-term lung condition that makes it hard to breathe. It also gets worse over time.  High blood pressure (hypertension), heart disease, stroke, or heart attack.  Lung infections, such as pneumonia.  Cataracts. This is when the lenses in the eyes become clouded.  Digestive problems. This may include peptic ulcers, heartburn, and gastroesophageal reflux disease (GERD).  Oral health problems, such as gum disease and tooth loss.  Loss of taste and smell. Smoking can affect your appearance by causing:  Wrinkles.  Yellow or stained teeth, fingers, and fingernails. Smoking tobacco can also affect your social life, because:  It may be challenging to find places to smoke when away from home. Many workplaces, restaurants, hotels, and public places are tobacco-free.  Smoking is expensive. This is due to the cost of tobacco and the long-term costs of treating health problems from smoking.  Secondhand smoke may affect those around you. Secondhand smoke can cause lung cancer, breathing problems, and heart disease. Children of smokers have a higher risk for: ? Sudden infant death syndrome (SIDS). ? Ear infections. ? Lung infections. If you currently smoke tobacco, quitting now can help you:  Lead a longer and healthier life.  Look, smell, breathe, and feel better over time.  Save money.  Protect others from the  harms of secondhand smoke. What actions can I take to prevent health problems? Quit smoking   Do not start smoking. Quit if you already do.  Make a plan to quit smoking and commit to it. Look for programs to help you and ask your health care provider for recommendations and ideas.  Set a date and write down all the reasons you want to quit.  Let your friends and family know you are quitting so they can help and support you. Consider finding friends who also want to quit. It can be easier to quit with someone else, so that you can support each other.  Talk with your health care provider about using nicotine replacement medicines to help you quit, such as gum, lozenges, patches, sprays, or pills.  Do not replace cigarette smoking with electronic cigarettes, which are commonly called e-cigarettes. The safety of e-cigarettes is not known, and some may contain harmful chemicals.  If you try to quit but return to smoking, stay positive. It is common to slip up when you first quit, so take it one day at a time.  Be prepared for cravings. When you feel the urge to smoke, chew gum or suck on hard candy. Lifestyle  Stay busy and take care of your body.  Drink enough fluid to keep your urine pale yellow.  Get plenty of exercise and eat a healthy diet. This can help prevent weight gain after quitting.  Monitor your eating habits. Quitting smoking can cause you to have a larger appetite than when you smoke.  Find ways to relax. Go out with friends or family to a movie or a restaurant   where people do not smoke.  Ask your health care provider about having regular tests (screenings) to check for cancer. This may include blood tests, imaging tests, and other tests.  Find ways to manage your stress, such as meditation, yoga, or exercise. Where to find support To get support to quit smoking, consider:  Asking your health care provider for more information and resources.  Taking classes to learn  more about quitting smoking.  Looking for local organizations that offer resources about quitting smoking.  Joining a support group for people who want to quit smoking in your local community.  Calling the smokefree.gov counselor helpline: 1-800-Quit-Now 715-680-8545) Where to find more information You may find more information about quitting smoking from:  HelpGuide.org: www.helpguide.org  https://hall.com/: smokefree.gov  American Lung Association: www.lung.org Contact a health care provider if you:  Have problems breathing.  Notice that your lips, nose, or fingers turn blue.  Have chest pain.  Are coughing up blood.  Feel faint or you pass out.  Have other health changes that cause you to worry. Summary  Smoking tobacco can negatively affect your health, the health of those around you, your finances, and your social life.  Do not start smoking. Quit if you already do. If you need help quitting, ask your health care provider.  Think about joining a support group for people who want to quit smoking in your local community. There are many effective programs that will help you to quit this behavior. This information is not intended to replace advice given to you by your health care provider. Make sure you discuss any questions you have with your health care provider. Document Released: 06/16/2016 Document Revised: 07/21/2017 Document Reviewed: 06/16/2016 Elsevier Interactive Patient Education  2019 [place] DASH stands for "Dietary Approaches to Stop Hypertension." The DASH eating plan is a healthy eating plan that has been shown to reduce high blood pressure (hypertension). It may also reduce your risk for type 2 diabetes, heart disease, and stroke. The DASH eating plan may also help with weight loss. What are tips for following this plan?  General guidelines  Avoid eating more than 2,300 mg (milligrams) of salt (sodium) a day. If you have  hypertension, you may need to reduce your sodium intake to 1,500 mg a day.  Limit alcohol intake to no more than 1 drink a day for nonpregnant women and 2 drinks a day for men. One drink equals 12 oz of beer, 5 oz of wine, or 1 oz of hard liquor.  Work with your health care provider to maintain a healthy body weight or to lose weight. Ask what an ideal weight is for you.  Get at least 30 minutes of exercise that causes your heart to beat faster (aerobic exercise) most days of the week. Activities may include walking, swimming, or biking.  Work with your health care provider or diet and nutrition specialist (dietitian) to adjust your eating plan to your individual calorie needs. Reading food labels   Check food labels for the amount of sodium per serving. Choose foods with less than 5 percent of the Daily Value of sodium. Generally, foods with less than 300 mg of sodium per serving fit into this eating plan.  To find whole grains, look for the word "whole" as the first word in the ingredient list. Shopping  Buy products labeled as "low-sodium" or "no salt added."  Buy fresh foods. Avoid canned foods and premade or frozen meals. Cooking  Avoid adding  salt when cooking. Use salt-free seasonings or herbs instead of table salt or sea salt. Check with your health care provider or pharmacist before using salt substitutes.  Do not fry foods. Cook foods using healthy methods such as baking, boiling, grilling, and broiling instead.  Cook with heart-healthy oils, such as olive, canola, soybean, or sunflower oil. Meal planning  Eat a balanced diet that includes: ? 5 or more servings of fruits and vegetables each day. At each meal, try to fill half of your plate with fruits and vegetables. ? Up to 6-8 servings of whole grains each day. ? Less than 6 oz of lean meat, poultry, or fish each day. A 3-oz serving of meat is about the same size as a deck of cards. One egg equals 1 oz. ? 2 servings of  low-fat dairy each day. ? A serving of nuts, seeds, or beans 5 times each week. ? Heart-healthy fats. Healthy fats called Omega-3 fatty acids are found in foods such as flaxseeds and coldwater fish, like sardines, salmon, and mackerel.  Limit how much you eat of the following: ? Canned or prepackaged foods. ? Food that is high in trans fat, such as fried foods. ? Food that is high in saturated fat, such as fatty meat. ? Sweets, desserts, sugary drinks, and other foods with added sugar. ? Full-fat dairy products.  Do not salt foods before eating.  Try to eat at least 2 vegetarian meals each week.  Eat more home-cooked food and less restaurant, buffet, and fast food.  When eating at a restaurant, ask that your food be prepared with less salt or no salt, if possible. What foods are recommended? The items listed may not be a complete list. Talk with your dietitian about what dietary choices are best for you. Grains Whole-grain or whole-wheat bread. Whole-grain or whole-wheat pasta. Brown rice. [place]. Bulgur. Whole-grain and low-sodium cereals. Pita bread. Low-fat, low-sodium crackers. Whole-wheat flour tortillas. Vegetables Fresh or frozen vegetables (raw, steamed, roasted, or grilled). Low-sodium or reduced-sodium tomato and vegetable juice. Low-sodium or reduced-sodium tomato sauce and tomato paste. Low-sodium or reduced-sodium canned vegetables. Fruits All fresh, dried, or frozen fruit. Canned fruit in natural juice (without added sugar). Meat and other protein foods Skinless chicken or [place]. Ground chicken or [place]. Pork with fat trimmed off. Fish and seafood. Egg whites. Dried beans, peas, or lentils. Unsalted nuts, nut butters, and seeds. Unsalted canned beans. Lean cuts of beef with fat trimmed off. Low-sodium, lean deli meat. Dairy Low-fat (1%) or fat-free (skim) milk. Fat-free, low-fat, or reduced-fat cheeses. Nonfat, low-sodium ricotta or cottage cheese. Low-fat or  nonfat yogurt. Low-fat, low-sodium cheese. Fats and oils Soft margarine without trans fats. Vegetable oil. Low-fat, reduced-fat, or light mayonnaise and salad dressings (reduced-sodium). Canola, safflower, olive, soybean, and sunflower oils. Avocado. Seasoning and other foods Herbs. Spices. Seasoning mixes without salt. Unsalted popcorn and pretzels. Fat-free sweets. What foods are not recommended? The items listed may not be a complete list. Talk with your dietitian about what dietary choices are best for you. Grains Baked goods made with fat, such as croissants, muffins, or some breads. Dry pasta or rice meal packs. Vegetables Creamed or fried vegetables. Vegetables in a cheese sauce. Regular canned vegetables (not low-sodium or reduced-sodium). Regular canned tomato sauce and paste (not low-sodium or reduced-sodium). Regular tomato and vegetable juice (not low-sodium or reduced-sodium). Angie Fava. Olives. Fruits Canned fruit in a light or heavy syrup. Fried fruit. Fruit in cream or butter sauce. Meat and other  protein foods Fatty cuts of meat. Ribs. Fried meat. Berniece Salines. Sausage. Bologna and other processed lunch meats. Salami. Fatback. Hotdogs. Bratwurst. Salted nuts and seeds. Canned beans with added salt. Canned or smoked fish. Whole eggs or egg yolks. Chicken or [place] with skin. Dairy Whole or 2% milk, cream, and half-and-half. Whole or full-fat cream cheese. Whole-fat or sweetened yogurt. Full-fat cheese. Nondairy creamers. Whipped toppings. Processed cheese and cheese spreads. Fats and oils Butter. Stick margarine. Lard. Shortening. Ghee. Bacon fat. Tropical oils, such as coconut, palm kernel, or palm oil. Seasoning and other foods Salted popcorn and pretzels. Onion salt, garlic salt, seasoned salt, table salt, and sea salt. Worcestershire sauce. Tartar sauce. Barbecue sauce. Teriyaki sauce. Soy sauce, including reduced-sodium. Steak sauce. Canned and packaged gravies. Fish sauce. Oyster  sauce. Cocktail sauce. Horseradish that you find on the shelf. Ketchup. Mustard. Meat flavorings and tenderizers. Bouillon cubes. Hot sauce and Tabasco sauce. Premade or packaged marinades. Premade or packaged taco seasonings. Relishes. Regular salad dressings. Where to find more information:  National Heart, Lung, and [place]: https://wilson-eaton.com/  American Heart Association: www.heart.org Summary  The DASH eating plan is a healthy eating plan that has been shown to reduce high blood pressure (hypertension). It may also reduce your risk for type 2 diabetes, heart disease, and stroke.  With the DASH eating plan, you should limit salt (sodium) intake to 2,300 mg a day. If you have hypertension, you may need to reduce your sodium intake to 1,500 mg a day.  When on the DASH eating plan, aim to eat more fresh fruits and vegetables, whole grains, lean proteins, low-fat dairy, and heart-healthy fats.  Work with your health care provider or diet and nutrition specialist (dietitian) to adjust your eating plan to your individual calorie needs. This information is not intended to replace advice given to you by your health care provider. Make sure you discuss any questions you have with your health care provider. Document Released: 05/21/2011 Document Revised: 05/25/2016 Document Reviewed: 05/25/2016 Elsevier Interactive Patient Education  2019 [employer].

## 2018-07-05 NOTE — Progress Notes (Signed)
Patient: Karla Carter Female    DOB: 25-Apr-1956   63 y.o.   MRN: [id number] Visit Date: 07/05/2018  Today's Provider: Langston Reusing, NP   Chief Complaint  Patient presents with  . New Patient (Initial Visit)    establish care; concerned about blood pressure   Subjective:    HPI Karla Carter 63 y/o female presents for initial visit and discuss about her history of hypertension, copd and depression. She reports that she was out of10 mg Amlodipine for 6 months because she lost her health care coverage. She admits to not checking BP at home. She denies headache, chest pain, palpitation. She reports smoking 10 cigarettes a day and admits the desire to quit.  She reports having a history of copd, and stated that she hasn't had any exercabation over the past 5 years, but uses  proventil inhaler as needed for shortness of breath. She denies shortness of breath, wheezing, cough. She declines influenza vaccine due to a cyst the size of a soft ball to the lateral aspect of her left upper arm that developed 7 years ago after receiving influenza vaccine in [place]. She denies pain to left arm and stated that the cyst bothers her.  She reports taking 50 mg Sertraline daily for depression and was diagnosed at [facility] on [address] 3 years ago. She reports that she's in a good mood, denies suicidal or homicidal ideation, requests follow up with Karla Carter for behavioral health. Otherwise she reports doing well.    Allergies  Allergen Reactions  . Codeine Rash   Previous Medications   MOMETASONE-FORMOTEROL (DULERA) 100-5 MCG/ACT AERO    Inhale 2 puffs into the lungs 2 (two) times daily.    Review of Systems  Constitutional: Negative.   HENT: Negative.   Eyes: Negative.   Respiratory: Negative.   Cardiovascular: Negative.   Gastrointestinal: Negative.   Endocrine: Negative.   Genitourinary: Negative.   Musculoskeletal: Negative.   Skin: Wound: scattered dry open lesion to left  lower leg   Neurological: Negative.   Psychiatric/Behavioral: Negative.     Social History   Tobacco Use  . Smoking status: Current Carter Day Smoker    Packs/day: 1.00    Types: Cigarettes  . Smokeless tobacco: Never Used  Substance Use Topics  . Alcohol use: No   Objective:   BP (!) 160/96 (BP Location: Right Arm, Patient Position: Sitting, Cuff Size: Large)   Pulse 71   Ht 5' 4"  (1.626 m)   Wt 147 lb 11.2 oz (67 kg)   SpO2 97%   BMI 25.35 kg/m   Physical Exam Constitutional:      Appearance: Normal appearance.  HENT:     Head: Normocephalic.     Nose: Nose normal.     Mouth/Throat:     Mouth: Mucous membranes are moist.  Eyes:     Extraocular Movements: Extraocular movements intact.     Pupils: Pupils are equal, round, and reactive to light.  Neck:     Musculoskeletal: Normal range of motion.  Cardiovascular:     Rate and Rhythm: Normal rate and regular rhythm.     Pulses: Normal pulses.     Heart sounds: Normal heart sounds.  Pulmonary:     Effort: Pulmonary effort is normal.     Breath sounds: Normal breath sounds.  Abdominal:     General: Bowel sounds are normal.     Palpations: Abdomen is soft.  Musculoskeletal: Normal range of motion.  Skin:  Findings: Lesion (scattered dried, pruritic open lesions to left lower leg) present.       Neurological:     General: No focal deficit present.     Mental Status: She is alert and oriented to person, place, and time.  Psychiatric:        Mood and Affect: Mood normal.        Behavior: Behavior normal.        Thought Content: Thought content normal.        Judgment: Judgment normal.         Assessment & Plan:     1. Encounter to establish care - She declined Influenza vaccine  2. Essential hypertension - Uncontrolled BP, rechecked during OV was 160/96, goal < 150/90 - She will continue on 10 mg Norvasc, advised to monitor blood pressure at home, document and bring log to next OV. She was educated on  the side effects of medication, advised on low salt diet, exercise 30 minutes daily. - amLODipine (NORVASC) 10 MG tablet; Take 1 tablet (10 mg total) by mouth daily.  Dispense: 30 tablet; Refill: 0 - CBC w/Diff; Future - Comp Met (CMET); Future - Lipid Profile; Future - HgB A1c; Future - Urinalysis; Future - Urine Microalbumin w/creat. ratio; Future - TSH; Future  3. History of depression - She will follow up with Karla Carter, for evaluation of depression - sertraline (ZOLOFT) 50 MG tablet; Take 1 tablet (50 mg total) by mouth daily.  Dispense: 30 tablet; Refill: 0  4. Skin lesion of lower leg  - She will use triamcinolone cream (KENALOG) 0.1 %; Apply 1 application topically 2 (two) times daily.  Dispense: 30 g; Refill: 0 - She was advised to notify provider for worsening skin lesion  5. History of COPD - She will continue to use albuterol as needed for shortness of breath and notify provider for worsening shortness of breath, wheezing and cough. - albuterol (PROVENTIL HFA;VENTOLIN HFA) 108 (90 Base) MCG/ACT inhaler; Inhale 2 puffs into the lungs Carter 4 (four) hours as needed for wheezing or shortness of breath.  Dispense: 1 Inhaler; Refill: 0  6. Lipoma of left upper extremities - She will complete charity care form for Ultra sound to left upper arm cyst.   - Follow up in 2 weeks   [place], NP   [facility]

## 2018-07-06 LAB — URINALYSIS
BILIRUBIN UA: NEGATIVE
Glucose, UA: NEGATIVE
KETONES UA: NEGATIVE
Leukocytes, UA: NEGATIVE
Nitrite, UA: NEGATIVE
Protein, UA: NEGATIVE
RBC UA: NEGATIVE
Specific Gravity, UA: 1.007 (ref 1.005–1.030)
Urobilinogen, Ur: 0.2 mg/dL (ref 0.2–1.0)
pH, UA: 6 (ref 5.0–7.5)

## 2018-07-06 LAB — MICROALBUMIN / CREATININE URINE RATIO
Creatinine, Urine: 49.5 mg/dL
Microalb/Creat Ratio: 49 mg/g creat — ABNORMAL HIGH (ref 0–29)
Microalbumin, Urine: 24.3 ug/mL

## 2018-07-07 ENCOUNTER — Other Ambulatory Visit: Payer: Self-pay

## 2018-07-07 DIAGNOSIS — I10 Essential (primary) hypertension: Secondary | ICD-10-CM

## 2018-07-08 LAB — CBC WITH DIFFERENTIAL/PLATELET
Basophils Absolute: 0.1 10*3/uL (ref 0.0–0.2)
Basos: 1 %
EOS (ABSOLUTE): 0.2 10*3/uL (ref 0.0–0.4)
Eos: 2 %
Hematocrit: 45 % (ref 34.0–46.6)
Hemoglobin: 15.3 g/dL (ref 11.1–15.9)
Immature Grans (Abs): 0 10*3/uL (ref 0.0–0.1)
Immature Granulocytes: 0 %
Lymphocytes Absolute: 4.8 10*3/uL — ABNORMAL HIGH (ref 0.7–3.1)
Lymphs: 36 %
MCH: 29.4 pg (ref 26.6–33.0)
MCHC: 34 g/dL (ref 31.5–35.7)
MCV: 87 fL (ref 79–97)
MONOS ABS: 0.8 10*3/uL (ref 0.1–0.9)
Monocytes: 6 %
Neutrophils Absolute: 7.4 10*3/uL — ABNORMAL HIGH (ref 1.4–7.0)
Neutrophils: 55 %
Platelets: 243 10*3/uL (ref 150–450)
RBC: 5.2 x10E6/uL (ref 3.77–5.28)
RDW: 13.1 % (ref 11.7–15.4)
WBC: 13.2 10*3/uL — ABNORMAL HIGH (ref 3.4–10.8)

## 2018-07-08 LAB — COMPREHENSIVE METABOLIC PANEL
A/G RATIO: 2 (ref 1.2–2.2)
ALT: 11 IU/L (ref 0–32)
AST: 9 IU/L (ref 0–40)
Albumin: 4.6 g/dL (ref 3.8–4.8)
Alkaline Phosphatase: 102 IU/L (ref 39–117)
BUN/Creatinine Ratio: 9 — ABNORMAL LOW (ref 12–28)
BUN: 9 mg/dL (ref 8–27)
Bilirubin Total: 0.3 mg/dL (ref 0.0–1.2)
CHLORIDE: 97 mmol/L (ref 96–106)
CO2: 23 mmol/L (ref 20–29)
Calcium: 9.7 mg/dL (ref 8.7–10.3)
Creatinine, Ser: 0.96 mg/dL (ref 0.57–1.00)
GFR calc Af Amer: 73 mL/min/{1.73_m2} (ref >59)
GFR calc non Af Amer: 64 mL/min/{1.73_m2} (ref >59)
Globulin, Total: 2.3 g/dL (ref 1.5–4.5)
Glucose: 79 mg/dL (ref 65–99)
POTASSIUM: 4.7 mmol/L (ref 3.5–5.2)
Sodium: 137 mmol/L (ref 134–144)
Total Protein: 6.9 g/dL (ref 6.0–8.5)

## 2018-07-08 LAB — HEMOGLOBIN A1C
Est. average glucose Bld gHb Est-mCnc: 114 mg/dL
Hgb A1c MFr Bld: 5.6 % (ref 4.8–5.6)

## 2018-07-08 LAB — LIPID PANEL
Chol/HDL Ratio: 6.1 ratio — ABNORMAL HIGH (ref 0.0–4.4)
Cholesterol, Total: 244 mg/dL — ABNORMAL HIGH (ref 100–199)
HDL: 40 mg/dL (ref >39)
LDL CALC: 157 mg/dL — AB (ref 0–99)
Triglycerides: 234 mg/dL — ABNORMAL HIGH (ref 0–149)
VLDL Cholesterol Cal: 47 mg/dL — ABNORMAL HIGH (ref 5–40)

## 2018-07-08 LAB — TSH: TSH: 1.51 u[IU]/mL (ref 0.450–4.500)

## 2018-07-11 ENCOUNTER — Other Ambulatory Visit: Payer: Self-pay

## 2018-07-11 ENCOUNTER — Telehealth: Payer: Self-pay

## 2018-07-11 DIAGNOSIS — D1722 Benign lipomatous neoplasm of skin and subcutaneous tissue of left arm: Secondary | ICD-10-CM

## 2018-07-11 NOTE — Telephone Encounter (Signed)
Left voicemail for pt to call [facility] to schedule [place] of lipoma of left upper arm. Needs charity care application.

## 2018-07-12 ENCOUNTER — Institutional Professional Consult (permissible substitution): Payer: Self-pay | Admitting: Licensed Clinical Social Worker

## 2018-07-19 ENCOUNTER — Ambulatory Visit: Payer: Self-pay | Admitting: Gerontology

## 2018-07-21 ENCOUNTER — Ambulatory Visit: Payer: Self-pay | Admitting: Ophthalmology

## 2018-07-26 ENCOUNTER — Ambulatory Visit: Payer: Self-pay | Admitting: Gerontology

## 2018-07-28 ENCOUNTER — Ambulatory Visit: Payer: Self-pay | Admitting: Ophthalmology

## 2018-07-28 ENCOUNTER — Other Ambulatory Visit: Payer: Self-pay | Admitting: Gerontology

## 2018-07-28 DIAGNOSIS — I10 Essential (primary) hypertension: Secondary | ICD-10-CM

## 2018-08-04 ENCOUNTER — Ambulatory Visit: Payer: Self-pay | Admitting: Ophthalmology

## 2018-08-11 ENCOUNTER — Ambulatory Visit: Payer: Self-pay | Admitting: Adult Health Nurse Practitioner

## 2018-08-11 VITALS — BP 136/71 | HR 81 | Ht 63.5 in | Wt 151.4 lb

## 2018-08-11 DIAGNOSIS — J111 Influenza due to unidentified influenza virus with other respiratory manifestations: Secondary | ICD-10-CM | POA: Insufficient documentation

## 2018-08-11 DIAGNOSIS — Z8659 Personal history of other mental and behavioral disorders: Secondary | ICD-10-CM

## 2018-08-11 DIAGNOSIS — D72829 Elevated white blood cell count, unspecified: Secondary | ICD-10-CM

## 2018-08-11 DIAGNOSIS — I10 Essential (primary) hypertension: Secondary | ICD-10-CM | POA: Insufficient documentation

## 2018-08-11 MED ORDER — AMLODIPINE BESYLATE 10 MG PO TABS: 10.0000 mg | ORAL_TABLET | Freq: Every day | ORAL | 4 refills | Status: DC

## 2018-08-11 NOTE — Progress Notes (Unsigned)
cbc

## 2018-08-11 NOTE — Progress Notes (Addendum)
  Patient: Karla Carter Female    DOB: 09/26/1955   63 y.o.   MRN: [id number] Visit Date: 08/11/2018  Today's Provider: Staci Acosta, NP   Chief Complaint  Patient presents with  . Follow-up    been experiencing R side numbness and "feels like knots"    Subjective:    HPI     Here for lab review.  LDL elevated WBC count elevated at 13.2 Denies fever, chills, N/V/D or open lesions. No recent illness.   Has not gotten Kenalog/Zoloft/Albuterol yet due to not being established with [facility]- they called today to setup an appointment to establish care.     Allergies  Allergen Reactions  . Codeine Rash   Previous Medications   ALBUTEROL (PROVENTIL HFA;VENTOLIN HFA) 108 (90 BASE) MCG/ACT INHALER    Inhale 2 puffs into the lungs every 4 (four) hours as needed for wheezing or shortness of breath.   MOMETASONE-FORMOTEROL (DULERA) 100-5 MCG/ACT AERO    Inhale 2 puffs into the lungs 2 (two) times daily.   SERTRALINE (ZOLOFT) 50 MG TABLET    Take 1 tablet (50 mg total) by mouth daily.   TRIAMCINOLONE CREAM (KENALOG) 0.1 %    Apply 1 application topically 2 (two) times daily.    Review of Systems  All other systems reviewed and are negative.   Social History   Tobacco Use  . Smoking status: Current Every Day Smoker    Packs/day: 1.00    Types: Cigarettes  . Smokeless tobacco: Never Used  Substance Use Topics  . Alcohol use: No   Objective:   BP 136/71 (BP Location: Left Arm, Patient Position: Sitting, Cuff Size: Normal)   Pulse 81   Ht 5' 3.5" (1.613 m)   Wt 151 lb 6.4 oz (68.7 kg)   BMI 26.40 kg/m   Physical Exam Vitals signs reviewed.  Constitutional:      Appearance: Normal appearance.  Neck:     Musculoskeletal: Normal range of motion and neck supple.  Cardiovascular:     Rate and Rhythm: Normal rate and regular rhythm.  Pulmonary:     Effort: Pulmonary effort is normal.     Breath sounds: Normal breath sounds.  Abdominal:     General: Bowel sounds are  normal.     Palpations: Abdomen is soft.  Skin:    General: Skin is warm and dry.  Neurological:     Mental Status: She is alert.         Assessment & Plan:        Repeat CBC tonight.   FU with [facility] to establish care to get medications.   Make appointment with Nira Conn.   FU if skin lesion gets worse.   Discussed healthy lifestyle changes for cholesterol.  Will discuss medications at next visit.   Given information to obtain L arm ultrasound.    Staci Acosta, NP   [facility]

## 2018-08-11 NOTE — Addendum Note (Signed)
Addended by: Reino Kent on: 08/11/2018 06:57 PM   Modules accepted: Orders, Level of Service

## 2018-08-11 NOTE — Patient Instructions (Signed)

## 2018-08-12 LAB — CBC WITH DIFFERENTIAL
BASOS: 1 %
Basophils Absolute: 0.1 10*3/uL (ref 0.0–0.2)
EOS (ABSOLUTE): 0.2 10*3/uL (ref 0.0–0.4)
Eos: 2 %
Hematocrit: 45.4 % (ref 34.0–46.6)
Hemoglobin: 15.8 g/dL (ref 11.1–15.9)
Immature Grans (Abs): 0.1 10*3/uL (ref 0.0–0.1)
Immature Granulocytes: 1 %
Lymphocytes Absolute: 4.3 10*3/uL — ABNORMAL HIGH (ref 0.7–3.1)
Lymphs: 33 %
MCH: 30.2 pg (ref 26.6–33.0)
MCHC: 34.8 g/dL (ref 31.5–35.7)
MCV: 87 fL (ref 79–97)
Monocytes Absolute: 1 10*3/uL — ABNORMAL HIGH (ref 0.1–0.9)
Monocytes: 8 %
Neutrophils Absolute: 7.3 10*3/uL — ABNORMAL HIGH (ref 1.4–7.0)
Neutrophils: 55 %
RBC: 5.24 x10E6/uL (ref 3.77–5.28)
RDW: 13 % (ref 11.7–15.4)
WBC: 13 10*3/uL — ABNORMAL HIGH (ref 3.4–10.8)

## 2018-08-15 ENCOUNTER — Encounter (INDEPENDENT_AMBULATORY_CARE_PROVIDER_SITE_OTHER): Payer: Self-pay

## 2018-08-15 ENCOUNTER — Ambulatory Visit: Payer: Self-pay | Admitting: Pharmacy Technician

## 2018-08-15 DIAGNOSIS — Z79899 Other long term (current) drug therapy: Secondary | ICD-10-CM

## 2018-08-15 NOTE — Progress Notes (Signed)
Completed [facility] application and contract.  Patient agreed to all terms of the [facility] contract.    Patient approved to receive medication assistance at [facility] as long as eligibility criteria continues to be met.    Provided patient with community resource material based on her particular needs.    Ventolin & Dulera Prescription Applications completed with patient.  Forwarded to [facility] for signature.  Upon receipt of signed applications from provider,Ventolin Prescription Application will be submitted to [facility] and Dulera Prescription Application will be submitted to [facility].  Betty J. Kluttz Care Manager [facility]  

## 2018-08-16 ENCOUNTER — Ambulatory Visit: Payer: Self-pay | Admitting: Licensed Clinical Social Worker

## 2018-08-16 DIAGNOSIS — F331 Major depressive disorder, recurrent, moderate: Secondary | ICD-10-CM

## 2018-08-16 NOTE — BH Specialist Note (Signed)
Integrated Behavioral Health Comprehensive Clinical Assessment  MRN: [id number] Name: Karla Carter  Type of Service: Integrated Behavioral Health-Individual Interpretor: No. Interpretor Name and Language: Not applicable.  PRESENTING CONCERNS: Karla Carter is a 63 y.o. female accompanied by her husband.Karla Carter was referred to [facility] clinician for mental health.  Previous mental health services Have you ever been treated for a mental health problem? Yes If "Yes", when were you treated and whom did you see? Karla Carter reports that in the past she was previously prescribed Zoloft 50 mg when she was living in [place]. She reports that she was placed back on it temporarily through [facility]) but could not longer afford the sliding scale fee. She would like to be placed back on this medication because it was effective at treating her mental health symptoms. Have you ever been hospitalized for mental health treatment? Negative Have you ever been treated for any of the following? Past Psychiatric History/Hospitalization(s): Anxiety: Negative Bipolar Disorder: Negative Depression: Yes Karla Carter has been experiencing depression for a long time. Her symptoms include: feeling down and depressed nearly every day, loss of interest in previously enjoyed activities, difficulty falling and staying asleep, fatigue, feeling bad about herself, and restlessness. She denies suicidal and homicidal thoughts.  Mania: Negative Psychosis: Negative Schizophrenia: Negative Personality Disorder: Negative Hospitalization for psychiatric illness: Negative History of Electroconvulsive Shock Therapy: Negative Prior Suicide Attempts: Negative Have you ever had thoughts of harming yourself or others or attempted suicide? No plan to harm self or others  Medical history  has a past medical history of Anxiety, COPD (chronic obstructive pulmonary disease) ([place]),  Depression, GERD (gastroesophageal reflux disease), and Hypertension. Primary Care Physician: Patient, No Pcp Per Date of last physical exam:  Allergies:  Allergies  Allergen Reactions  . Codeine Rash   Current medications:  Outpatient Encounter Medications as of 08/16/2018  Medication Sig  . albuterol (PROVENTIL HFA;VENTOLIN HFA) 108 (90 Base) MCG/ACT inhaler Inhale 2 puffs into the lungs every 4 (four) hours as needed for wheezing or shortness of breath. (Patient not taking: Reported on 08/11/2018)  . amLODipine (NORVASC) 10 MG tablet Take 1 tablet (10 mg total) by mouth daily.  . mometasone-formoterol (DULERA) 100-5 MCG/ACT AERO Inhale 2 puffs into the lungs 2 (two) times daily. (Patient not taking: Reported on 07/05/2018)  . sertraline (ZOLOFT) 50 MG tablet Take 1 tablet (50 mg total) by mouth daily. (Patient not taking: Reported on 08/11/2018)  . triamcinolone cream (KENALOG) 0.1 % Apply 1 application topically 2 (two) times daily. (Patient not taking: Reported on 08/11/2018)   No facility-administered encounter medications on file as of 08/16/2018.    Have you ever had any serious medication reactions? Yes- Codeine. Is there any history of mental health problems or substance abuse in your family? Yes- Karla Carter reports that her youngest son has had problems with addiction in the past. She explains that he carries a diagnosis of Bipolar disorder and her granddaughter carries a diagnosis of bipolar schizophrenia.  Has anyone in your family been hospitalized for mental health treatment? Yes- Karla Carter reports that she doesn't speak too often with her son and that they have always had a strained realtionship with one another. She explains that he is on medications and sees a psychiatrist but is not sure if he has been hospitalized for mental illness in the past.   Social/family history Who lives in your current household? Karla Carter lives with her husband, son, son's  girlfriend, and two step  grandchildren. What is your family of origin, childhood history? Karla Carter was born in [place], [place]. Where were you born? [facility]. Where did you grow up? Karla Carter reports that she grew up in [place]. How many different homes have you lived in? A few. Describe your childhood: Karla Carter was raised by both her parents. She explains that she loved her childhood and it was great.  Do you have siblings, step/half siblings? Yes- Karla Carter is the oldest of 4 siblings. She has a 3 year old sister who lives in [place], a 23 year old sister who lives in [place], and a 68 year old sister lives in [place].  What are their names, relation, sex, age? See above. She explains that she was close with her sisters when they were all growing up but don't talk or see each other very often anymore.  Are your parents separated or divorced? No What are your social supports? Karla Carter reports that she has no one to talk to so she writes her feelings down.   Karla Carter has been married three times. She got married at the age of 18 and it lasted a couple of years. She explains that it ended because she was too young. She reports that she got married again at the age of 35 but left divorced her husband after a couple of years because he was an alcoholic. She has been married to her current husband for 20 years.   Education How many grades have you completed? Karla Carter graduated high school. Did you have any problems in school? 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 She has applied for retirement.   Sleep Usual bedtime varies. Sleeping arrangements: Karla Carter sleeps in the bed with her husband.  Problems with snoring: No Obstructive sleep apnea is not a concern. Problems with nightmares: No Problems with night terrors: No Problems with sleepwalking:  No  Trauma/Abuse history Have you ever experienced or been exposed to any form of abuse? No Have you ever experienced or been exposed to something traumatic? No  Substance use Do you use alcohol, nicotine or caffeine? no alcohol use How old were you when you first tasted alcohol? Ms. Calma denies using or abusing alcohol or other drugs. Have you ever used illicit drugs or abused prescription medications? Ms. Riecke deneis ever being in substance abuse treatment.   Mental status General appearance/Behavior: Casual Eye contact: Fair Motor behavior: Normal Speech: Normal Level of consciousness: Alert Mood: Euthymic Affect: Appropriate Anxiety level: None Thought process: Coherent Thought content: WNL Perception: Normal Judgment: Fair Insight: Present  Diagnosis No diagnosis found.  GOALS ADDRESSED: Patient will reduce symptoms of: depression and insomnia and increase knowledge and/or ability of: coping skills, healthy habits, self-management skills and stress reduction and also: Increase healthy adjustment to current life circumstances              INTERVENTIONS: Interventions utilized: Psychoeducation and/or Health Education Standardized Assessments completed: GAD-7 and PHQ 9 Depression is at a 12 and anxiety is at a 3.   ASSESSMENT/OUTCOME:  Nyjah Schwake is a 63 year old Caucasian female who presents today for a mental health assessment and is self referred. Ms. Bergman reports that she has been dealing with depression for a long time. She explains that her depression started when she moved to  [place] and became home sick to come back to [place]. She was previously on Zoloft 50 mg daily for depression prescribed by her primary care doctor in [place] and another primary care provider at [facility] until she could no long afford the sliding scale fee. She denies ever being hospitalized for mental illness or substance abuse. She denies using or abusing  alcohol or other drugs. She has not previously seen a therapist in the past.  Ms. Desta has a history of GERD, hypertension, Acute bronchitis, and COPD. She is a newly established patient at [facility]. She has had adverse drug reactions to Codeine in the past. She smokes a pack of cigarettes per week.   Ms. Mcconahy lives with her third husband of 78 years, her oldest son, his girlfriend, and two step grandchildren. She has eight grandchildren and one great granddaughter. She has two sons; 40 and 19. She has applied for retirement and was previously working in [employer]. She reports that her youngest son carries a diagnosis of Bipolar disorder and is prescribed medications. She reports that one of her granddaughters has Bipolar schizophrenia. She is not sure if her son or granddaughter have been hospitalized for mental illness in the past.   PLAN: Case consultation with Dr. Octavia Heir, MD, psychiatric consultant on Wednesday March 4th to discuss treatment recommendations.   Scheduled next visit: three weeks. Tuesday March 24th @ 5:00 pm.   [place] Work

## 2018-08-22 ENCOUNTER — Other Ambulatory Visit: Payer: Self-pay | Admitting: Gerontology

## 2018-08-22 DIAGNOSIS — Z8709 Personal history of other diseases of the respiratory system: Secondary | ICD-10-CM

## 2018-08-31 ENCOUNTER — Telehealth: Payer: Self-pay | Admitting: Pharmacist

## 2018-08-31 ENCOUNTER — Encounter (INDEPENDENT_AMBULATORY_CARE_PROVIDER_SITE_OTHER): Payer: Self-pay

## 2018-08-31 NOTE — Telephone Encounter (Signed)
08/31/2018 10:16:22 AM - Ventolin HFA  12/14/6376 Faxing [employer] application for Ventolin HFA 29mcg Inhale 2 puffs into the lungs every 4 hours as needed for wheezing or shortness of breath.Karla Carter

## 2018-08-31 NOTE — Telephone Encounter (Signed)
08/31/2018 10:15:24 AM - Ruthe Mannan  08/31/2018 I am mailing [employer] application for [employer] 201/0 Inhale 2 puffs twice a day.Delos Haring

## 2018-09-06 ENCOUNTER — Ambulatory Visit: Payer: Self-pay | Admitting: Licensed Clinical Social Worker

## 2018-09-06 ENCOUNTER — Telehealth: Payer: Self-pay | Admitting: Licensed Clinical Social Worker

## 2018-09-06 ENCOUNTER — Other Ambulatory Visit: Payer: Self-pay

## 2018-09-06 DIAGNOSIS — F331 Major depressive disorder, recurrent, moderate: Secondary | ICD-10-CM

## 2018-09-06 NOTE — Telephone Encounter (Signed)
Clinician attempted to reach out to patient to let her know that she did not need to cancel her appointment because due to the COVID 19 virus that each patient will have visits by phone or telehealth. Her voicemail box is full and not an option to leave a voicemail.

## 2018-09-06 NOTE — BH Specialist Note (Signed)
Integrated Behavioral Health Follow Up Phone Visit  MRN: [id number] Name: Karla Carter  Number of [place] Clinician visits: 1/6  Type of Service: [place] Interpretor:No. Interpretor Name and Language: not applicable.  SUBJECTIVE: Karla Carter is a 63 y.o. female accompanied by herself. Patient was referred by  for . Patient reports the following symptoms/concerns: She reports that she has been feeling down and depressed lately. She explains that she thinks it has a lot to do with not being able to work and how stressful things are right now. She reports that she has friends and family who are willing to help her out with what she needs right now. She reports that she is glad that she was able to get new glasses and can see. She notes that she is trying to look on the positive side of things and is crocheting again. She denies suicidal and homicidal thoughts.  Duration of problem: ; Severity of problem: moderate  OBJECTIVE: Mood: Depressed and Affect: Appropriate Risk of harm to self or others: No plan to harm self or others  LIFE CONTEXT: Family and Social: See above. School/Work: See above. Self-Care: See above. Life Changes: See above.  GOALS ADDRESSED: Patient will: 1.  Reduce symptoms of: depression  2.  Increase knowledge and/or ability of: healthy habits, self-management skills and stress reduction  3.  Demonstrate ability to: Increase healthy adjustment to current life circumstances  INTERVENTIONS: Interventions utilized:  Brief CBT was utilized by the clinician focusing on her depression and coping skills. Clinician processed with the patient regarding how she has been doing since the last follow up session. Clinician processed with the patient regarding how her overall mood has been. Clinician explained that she understands that when times are difficult that its hard to stay positive and think about the good  things going on in your life. Clinician discussed the concept of gratitude; thinking of at least three positive things each day, writing them down, and keeping a running journal to improve her overall mood.  Standardized Assessments completed: GAD-7 and PHQ 9  ASSESSMENT: Patient currently experiencing symptoms of depression.   Patient may benefit from gratitude and continuing with therapy once a month.  PLAN: 1. Follow up with behavioral health clinician on : 1 month or earlier if needed. 2. Behavioral recommendations: See above. 3. Referral(s): [place] (In Clinic) 4. "From scale of 1-10, how likely are you to follow plan?": [place], LCSW

## 2018-09-08 ENCOUNTER — Other Ambulatory Visit: Payer: Self-pay

## 2018-09-08 DIAGNOSIS — Z8659 Personal history of other mental and behavioral disorders: Secondary | ICD-10-CM

## 2018-09-08 MED ORDER — SERTRALINE HCL 50 MG PO TABS: 50.0000 mg | ORAL_TABLET | Freq: Every day | ORAL | 1 refills | Status: DC

## 2018-10-04 ENCOUNTER — Ambulatory Visit: Payer: Self-pay | Admitting: Licensed Clinical Social Worker

## 2018-10-04 ENCOUNTER — Other Ambulatory Visit: Payer: Self-pay

## 2018-10-04 DIAGNOSIS — F331 Major depressive disorder, recurrent, moderate: Secondary | ICD-10-CM

## 2018-10-04 NOTE — BH Specialist Note (Signed)
Integrated Behavioral Health Follow Up Phone Visit  MRN: [id number] Name: Karla Carter  Number of [place] Clinician visits: 2/6  Type of Service: [place] Interpretor:No. Interpretor Name and Language: N/A.  SUBJECTIVE: Karla Carter is a 63 y.o. female accompanied by herself. Patient was referred by self for mental health. Patient reports the following symptoms/concerns: She reports that she has been having a difficult time in the last couple of weeks. She describes not wanting to be around anyone and preferring to be around her animals. She reports that she has been isolating in her bedroom. She explains that in the past she used to like to cook and bake but hasn't had motivation to really do much of it in the last two years. She notes that she has a difficulty falling asleep and staying asleep. She asked if the dosage of the Zoloft could be increased. She denies suicidal and homicidal thoughts.  Duration of problem:  Severity of problem: moderate  OBJECTIVE: Mood: Euthymic and Affect: Appropriate Risk of harm to self or others: No plan to harm self or others  LIFE CONTEXT: Family and Social: see above. School/Work: Ms. Berhow has applied for unemployment due to being laid off from her job. Self-Care: see above. Life Changes: see above.   GOALS ADDRESSED: Patient will: 1.  Reduce symptoms of: depression  2.  Increase knowledge and/or ability of: coping skills and healthy habits  3.  Demonstrate ability to: Increase healthy adjustment to current life circumstances  INTERVENTIONS: Interventions utilized:  Brief CBT was utilized by the clinician focusing on the patient's mood and behavioral activation. Clinician processed with the patient regarding how she has been doing since the last follow up session. Clinician discussed with the patient her symptoms of depression. Clinician explained to the patient that isolating  herself in the same room everyday is not going to help her symptoms of depression but make them worse. Clinician asked the patient what she has done in the past for enjoyment. Clinician explained to the patient the concept of Behavioral Activation; having a routine, a mix of responsibilities and things for enjoyment. Clinician explained to the patient that she is not burning off enough energy during the day so it makes sense why she is not able to sleep at night.  Standardized Assessments completed: GAD-7 and PHQ 9  ASSESSMENT: Patient currently experiencing symptoms of depression due the pandemic and being out of work.   Patient may benefit from behavioral activation.  PLAN: 1. Follow up with behavioral health clinician on : one month or earlier if needed. 2. Behavioral recommendations: Case consultation with Dr. Octavia Heir, [place], Psychiatric consultant on Tuesday April 28th @ 9 am.  3. Referral(s): [place] (In Clinic) 4. "From scale of 1-10, how likely are you to follow plan?":   Bayard Hugger, LCSW

## 2018-11-01 ENCOUNTER — Ambulatory Visit: Payer: Self-pay | Admitting: Licensed Clinical Social Worker

## 2018-11-01 ENCOUNTER — Other Ambulatory Visit: Payer: Self-pay

## 2018-11-01 DIAGNOSIS — F331 Major depressive disorder, recurrent, moderate: Secondary | ICD-10-CM

## 2018-11-01 NOTE — BH Specialist Note (Signed)
Integrated Behavioral Health Follow Up Visit  MRN: [id number] Name: Karla Carter  Number of [place] Clinician visits: 3/6  Type of Service: [place] Interpretor:No. Interpretor Name and Language: Not applicable.  SUBJECTIVE: Karla Carter is a 63 y.o. female accompanied by herself. Patient was referred by self for mental health. Patient reports the following symptoms/concerns: She reports that the pharmacy did not contact her to let her know that she had a medication to pick up. She reports that she is having a hard time sleeping and its affected her mood. She reports that she has been told by her husband and son that she is irritable. She notes that she tries to stay busy during the day with taking her dogs out and doing some walking. She describes an increase in her anxiety with any time that she has to leave the house and go to the store. She notes that she tried over the counter sleeping pills to help with her sleep but hasn't helped at all. She denies suicidal and homicidal thoughts. Duration of problem: ; Severity of problem: moderate  OBJECTIVE: Mood: Euthymic and Affect: Appropriate Risk of harm to self or others: No plan to harm self or others  LIFE CONTEXT: Family and Social: see above. School/Work: see above. Self-Care: see above. Life Changes: see above.  GOALS ADDRESSED: Patient will: 1.  Reduce symptoms of: anxiety and insomnia  2.  Increase knowledge and/or ability of: healthy habits and self-management skills  3.  Demonstrate ability to: Increase healthy adjustment to current life circumstances  INTERVENTIONS: Interventions utilized:  Brief CBT was utilized by the clinician focusing on her symptoms of anxiety and affect on normal cognition. Clinician processed with the patient regarding how she has been doing since the last follow up session. Clinician asked the patient if she had picked up the increased  dosage of her Zoloft and asked how it was working so far. Clinician explained to the patient that the prescription was sent to the pharmacy on March 26th. Clinician processed with the patient regarding the triggers for her irritability and anxiety. Clinician explained that it sounds like her lack of sleep is causing her irritability. Clinician discussed the concept of sleep hygiene.  Standardized Assessments completed: GAD-7 and PHQ 9  ASSESSMENT: Patient currently experiencing see above .   Patient may benefit from see above.  PLAN: 1. Follow up with behavioral health clinician on : three to four weeks or earlier if needed. 2. Behavioral recommendations: see above. 3. Referral(s): [facility] (In Clinic) 4. "From scale of 1-10, how likely are you to follow plan?":   Bayard Hugger, LCSW

## 2018-11-02 ENCOUNTER — Other Ambulatory Visit: Payer: Self-pay | Admitting: Gerontology

## 2018-11-02 DIAGNOSIS — Z8659 Personal history of other mental and behavioral disorders: Secondary | ICD-10-CM

## 2018-11-03 ENCOUNTER — Other Ambulatory Visit: Payer: Self-pay

## 2018-11-03 DIAGNOSIS — Z8659 Personal history of other mental and behavioral disorders: Secondary | ICD-10-CM

## 2018-11-03 MED ORDER — SERTRALINE HCL 50 MG PO TABS: 100.0000 mg | ORAL_TABLET | Freq: Every day | ORAL | 1 refills | Status: DC

## 2018-11-10 ENCOUNTER — Ambulatory Visit: Payer: Self-pay

## 2018-11-10 ENCOUNTER — Telehealth: Payer: Self-pay | Admitting: Pharmacist

## 2018-11-10 NOTE — Telephone Encounter (Signed)
11/10/2018 9:09:47 AM - Ventolin HFA refill  11/10/2018 Placed refill online with [facility] for Ventolin HFA to ship 11/23/2018, order# [id number].Delos Haring

## 2018-11-17 ENCOUNTER — Other Ambulatory Visit: Payer: Self-pay

## 2018-11-17 ENCOUNTER — Ambulatory Visit: Payer: Self-pay | Admitting: Urology

## 2018-11-17 ENCOUNTER — Encounter: Payer: Self-pay | Admitting: Urology

## 2018-11-17 DIAGNOSIS — Z7689 Persons encountering health services in other specified circumstances: Secondary | ICD-10-CM

## 2018-11-17 DIAGNOSIS — D72828 Other elevated white blood cell count: Secondary | ICD-10-CM

## 2018-11-17 DIAGNOSIS — M25561 Pain in right knee: Secondary | ICD-10-CM

## 2018-11-17 MED ORDER — MELOXICAM 7.5 MG PO TABS: 7.5000 mg | ORAL_TABLET | Freq: Every day | ORAL | 3 refills | Status: DC

## 2018-11-17 NOTE — Progress Notes (Signed)
Virtual Visit via Telephone Note  I connected with Karla Carter on 11/17/18 at  7:00 PM EDT by telephone and verified that I am speaking with the correct person using two identifiers.  Location: Patient: home Provider: [facility]   I discussed the limitations, risks, security and privacy concerns of performing an evaluation and management service by telephone and the availability of in person appointments. I also discussed with the patient that there may be a patient responsible charge related to this service. The patient expressed understanding and agreed to proceed.   History of Present Illness: Right knee pain x 2, no know injury, rainy days make it worse, nothing makes it better, has a history of pain in the right knee, not swollen or red, 10/10 pain at night, ice/heat packs ineffective, no fevers/chills, no nausea or vomiting, father with OA  Taking Tylenol at night  - helps a little    Observations/Objective: Patient does not sound distressed and is answering questions appropriately.    Assessment and Plan:  1. Right knee pain Schedule labs for RA Script for Mobic 7.5 mg daily - educated concerning GI bleed as she is on Zoloft  2. Health maintenance  Schedule routine labs   3. Elevated WBC Recheck CBC    Follow Up Instructions:  Schedule labs and follow up will be based on lab results    I discussed the assessment and treatment plan with the patient. The patient was provided an opportunity to ask questions and all were answered. The patient agreed with the plan and demonstrated an understanding of the instructions.   The patient was advised to call back or seek an in-person evaluation if the symptoms worsen or if the condition fails to improve as anticipated.  I provided 30 minutes of non-face-to-face time during this encounter.   Chadwick Reiswig, PA-C

## 2018-11-22 ENCOUNTER — Ambulatory Visit: Payer: Self-pay | Admitting: Licensed Clinical Social Worker

## 2018-11-22 ENCOUNTER — Telehealth: Payer: Self-pay | Admitting: Licensed Clinical Social Worker

## 2018-11-22 NOTE — Telephone Encounter (Signed)
Clinician reached out to the patient at the time of her scheduled appointment and her voice mail box has not been set up yet.

## 2018-12-21 ENCOUNTER — Other Ambulatory Visit: Payer: Self-pay

## 2019-01-05 ENCOUNTER — Other Ambulatory Visit: Payer: Self-pay | Admitting: Gerontology

## 2019-01-05 DIAGNOSIS — Z8659 Personal history of other mental and behavioral disorders: Secondary | ICD-10-CM

## 2019-01-11 ENCOUNTER — Encounter: Payer: Self-pay | Admitting: Pharmacist

## 2019-01-11 ENCOUNTER — Ambulatory Visit: Payer: Self-pay | Admitting: Pharmacist

## 2019-01-11 DIAGNOSIS — Z79899 Other long term (current) drug therapy: Secondary | ICD-10-CM

## 2019-01-11 NOTE — Progress Notes (Signed)
Medication Management Clinic Phone Visit Note  Patient: Karla Carter MRN: [id number] Date of Birth: 03-24-1956 PCP: Patient, No Pcp Per   Collier Bullock 63 y.o. female was contacted for a medication therapy management visit/outreach call today. Two patient identifiers were used to verify patient's identity over the phone.  There were no vitals taken for this visit.  Patient Information   Past Medical History:  Diagnosis Date  . Anxiety   . COPD (chronic obstructive pulmonary disease) ([place])   . Depression   . GERD (gastroesophageal reflux disease)   . Hypertension       Past Surgical History:  Procedure Laterality Date  . ABDOMINAL HYSTERECTOMY    . APPENDECTOMY    . COLON SURGERY    . TONSILLECTOMY       Family History  Problem Relation Age of Onset  . Cancer Father   . Cancer Maternal Aunt     New Diagnoses (since last visit): N/A  Family Support: Good  Lifestyle Diet: Breakfast: does not eat Lunch: does not eat Dinner: chicken or hamburger, starch, occasionally a vegetable *focuses on finding lower salt options Drinks: coffee, coke Exercise: walks dog daily around the house for 5-10 minutes    Social History   Substance and Sexual Activity  Alcohol Use No      Social History   Tobacco Use  Smoking Status Current Every Day Smoker  . Packs/day: 0.50  . Types: Cigarettes  Smokeless Tobacco Never Used      Health Maintenance  Topic Date Due  . Hepatitis C Screening  Jun 23, 1955  . HIV Screening  01/08/1971  . TETANUS/TDAP  01/08/1975  . PAP SMEAR-Modifier  01/07/1977  . MAMMOGRAM  01/07/2006  . COLONOSCOPY  01/07/2006  . INFLUENZA VACCINE  01/14/2019   Outpatient Encounter Medications as of 01/11/2019  Medication Sig  . albuterol (PROVENTIL HFA;VENTOLIN HFA) 108 (90 Base) MCG/ACT inhaler INHALE 2 PUFFS INTO THE LUNGS EVERY 4 HOURS AS NEEDED FOR WHEEZING ORSHORTNESS OF BREATH  . amLODipine (NORVASC) 10 MG tablet Take 1 tablet (10 mg  total) by mouth daily.  . meloxicam (MOBIC) 7.5 MG tablet Take 1 tablet (7.5 mg total) by mouth daily.  . mometasone-formoterol (DULERA) 100-5 MCG/ACT AERO Inhale 2 puffs into the lungs 2 (two) times daily. (Patient taking differently: Inhale 2 puffs into the lungs daily. )  . sertraline (ZOLOFT) 50 MG tablet TAKE TWO TABLETS (100 mg) BY MOUTH EVERY DAY  . triamcinolone cream (KENALOG) 0.1 % Apply 1 application topically 2 (two) times daily. (Patient not taking: Reported on 08/11/2018)   No facility-administered encounter medications on file as of 01/11/2019.     Assessment and Plan:  1. Adherence Pt reports never missing a dose of medication as her husband keeps her accountable. Refill hx indicates compliance. Pt did not have medications on hand during interview and struggled with remembering medication strengths but was able to discuss medication names, frequency, and indication.  2. HTN  Hx of hypertension on amlodipine 10mg  daily. BP (07/2018) 136/71. Pt does not have a BP cuff at home and was unsure of BP goal but says she watches what she eats and has purposefully cut salt out of her diet. Encouraged pt to try to walk dog for a little longer around her home once weekly to increase exercise tolerance.  3. COPD Hx of COPD on albuterol PRN and Dulera. Pt reports needing albuterol more frequently with heat of summer and uses 1-2x daily. Pt only uses Dulera once  daily (Rx written for twice daily). Pt would benefit from education regarding inhalers.  4. Right knee pain Recent right knee pain prescribed meloxicam 7.5mg  daily. Pt reports knee pain is much improved with meloxicam and verbalized understanding of risk of GI bleed and signs/symptoms.   5. Depression/anxiety Hx of depression and anxiety on sertraline 100mg  daily. Pt voiced frustration over not getting sertraline refilled the last time she came to [facility] but assured pt it is now ready to be picked up. Pt to come back tomorrow for refill.  Pt is followed by LCSW at [facility] and was recently increased from 50mg  to 100mg  sertraline.  6. Smoking Cessation Hx of smoking, currently smokes 0.5ppd. Pt had no interest in smoking cessation resources at this time but did voice understanding of negative impact of smoking on COPD control.   RTC: 1y (01/11/2020)  Ladoris Gene, PharmD Candidate [facility]  Cosigned: Netta Neat, PharmD, [facility]) [phone]

## 2019-01-16 ENCOUNTER — Other Ambulatory Visit: Payer: Self-pay

## 2019-02-02 ENCOUNTER — Other Ambulatory Visit: Payer: Self-pay

## 2019-02-02 ENCOUNTER — Ambulatory Visit: Payer: Self-pay | Admitting: Ophthalmology

## 2019-02-02 DIAGNOSIS — I10 Essential (primary) hypertension: Secondary | ICD-10-CM

## 2019-02-02 DIAGNOSIS — Z8659 Personal history of other mental and behavioral disorders: Secondary | ICD-10-CM

## 2019-02-02 MED ORDER — SERTRALINE HCL 50 MG PO TABS: 100.0000 mg | ORAL_TABLET | Freq: Every day | ORAL | 0 refills | Status: DC

## 2019-02-02 MED ORDER — AMLODIPINE BESYLATE 10 MG PO TABS: 10.0000 mg | ORAL_TABLET | Freq: Every day | ORAL | 4 refills | Status: DC

## 2019-02-07 ENCOUNTER — Ambulatory Visit: Payer: Self-pay | Admitting: Licensed Clinical Social Worker

## 2019-02-07 ENCOUNTER — Other Ambulatory Visit: Payer: Self-pay

## 2019-02-07 DIAGNOSIS — F331 Major depressive disorder, recurrent, moderate: Secondary | ICD-10-CM

## 2019-02-07 NOTE — BH Specialist Note (Signed)
Integrated Behavioral Health Follow Up Visit Via Phone  MRN: [id number] Name: Karla Carter  Number of [place] Clinician visits: 4/6  Type of Service: [place] Interpretor:No. Interpretor Name and Language: Not applicable.  SUBJECTIVE: Karla Carter is a 63 y.o. female accompanied by herself. Patient was referred by self for mental health Patient reports the following symptoms/concerns:She reports that she has her good and bad days in terms of her depression. She notes that one of her dogs just passed away at the age of 40 years old. She explains that her biggest issue is not being able to sleep and maybe getting hour to three hours of sleep a night. She explains that she has been talking in her sleep and wakes herself up. She explains that when she tries to go to sleep at night that she has these racing thoughts. She describes feeling irritable and becoming frustrated easily. She explains that she has these crying spells three to four times a week. She denies suicidal and homicidal thoughts.  Duration of problem: ; Severity of problem: moderate  OBJECTIVE: Mood: Euthymic and Affect: Appropriate Risk of harm to self or others: No plan to harm self or others  LIFE CONTEXT: Family and Social: See above. School/Work: Mrs. Hosbach is unemployed at this point in time. Self-Care: See above. Life Changes: See above.   GOALS ADDRESSED: Patient will: 1.  Reduce symptoms of: depression and insomnia  2.  Increase knowledge and/or ability of: coping skills, healthy habits and self-management skills  3.  Demonstrate ability to: Increase healthy adjustment to current life circumstances  INTERVENTIONS: Interventions utilized:  Brief CBT was utilized by the clinician discussing the concept of sleep hygiene. Clinician processed with the patient regarding how she has been doing since the last follow up session. Clinician expressed her  condolences for the loss of her dog. Clinician explained to the patient that in addition to dealing with grief that it sounds like her lack of sleep is affecting her overall day to day functioning. Clinician asked the patient how much sleep is she getting per night. Clinician provided psycho education about sleep hygiene; ie going to bed around the same time each night, waking up the same time each morning, avoiding doing anything in her bed other than sleep, and when she is unable to sleep find some type of activity to do for thirty minutes to an hour outside of her bed then try to go back to sleep. Clinician explained that studies show that when you lay in bed while watching TV and using any type of electronic device that your brain stops associating your bed with sleep. Clinician explained to the patient that she would speak with the psychiatric consultant Dr. Octavia Heir, MD, on Tuesday September 1st @ 9 am to discuss the option of adding a medication for sleep.  Standardized Assessments completed: GAD-7 and PHQ 9  ASSESSMENT: Patient currently experiencing see above.   Patient may benefit from see above.  PLAN: 1. Follow up with behavioral health clinician on :  2. Behavioral recommendations: See above. 3. Referral(s): [place] (In Clinic) 4. "From scale of 1-10, how likely are you to follow plan?":   Bayard Hugger, LCSW

## 2019-02-15 ENCOUNTER — Other Ambulatory Visit: Payer: Self-pay

## 2019-02-15 DIAGNOSIS — Z8659 Personal history of other mental and behavioral disorders: Secondary | ICD-10-CM

## 2019-02-15 MED ORDER — MIRTAZAPINE 7.5 MG PO TABS: 7.5000 mg | ORAL_TABLET | Freq: Every day | ORAL | 0 refills | Status: DC

## 2019-02-15 MED ORDER — SERTRALINE HCL 50 MG PO TABS: 150.0000 mg | ORAL_TABLET | Freq: Every day | ORAL | 0 refills | Status: DC

## 2019-02-21 ENCOUNTER — Ambulatory Visit: Payer: Self-pay | Admitting: Licensed Clinical Social Worker

## 2019-02-21 ENCOUNTER — Other Ambulatory Visit: Payer: Self-pay

## 2019-02-21 DIAGNOSIS — F331 Major depressive disorder, recurrent, moderate: Secondary | ICD-10-CM

## 2019-02-21 NOTE — BH Specialist Note (Signed)
Integrated Behavioral Health Follow Up Visit Via Phone.  MRN: [id number] Name: DELORICE LODWICK  Type of Service: [place] Interpretor:No. Interpretor Name and Language: not applicable.  SUBJECTIVE: Karla Carter is a 63 y.o. female accompanied by herself. Patient was referred by self for mental health. Patient reports the following symptoms/concerns: She reports that she picked up the new medication and the Zoloft on Friday. She describes feeling less down and depressed. She explains that she seems to feel a little better and as no longer feeling wound up. She explains that she has been a little more relaxed until a situation at the house occurs and that seems to be everyday. She notes that she has experienced a decrease in crying spells. She explains that she has been having arguments with her husbands depending on what is going on in the house. She explains that there is a lot of different dynamics going on in the house due to living with her husband, one of her son's, her son's girlfriend, girlfriend's two kids, and her other son has recently moved in with them. She explains that other people's problems tend to bother her and gets upset when she cannot help fix their problems. She explains that she tries to not say anything to her husband when he gets upset with her because she wants to avoid getting upset. She denies suicidal and homicidal thoughts.  Duration of problem: ; Severity of problem: moderate  OBJECTIVE: Mood: Euthymic and Affect: Appropriate Risk of harm to self or others: No plan to harm self or others  LIFE CONTEXT: Family and Social: See above. School/Work: See above. Self-Care: See above. Life Changes: See above.  GOALS ADDRESSED: Patient will: 1.  Reduce symptoms of: depression  2.  Increase knowledge and/or ability of: coping skills and self-management skills  3.  Demonstrate ability to: Increase healthy adjustment to current  life circumstances  INTERVENTIONS: Interventions utilized:  Brief CBT was utilized bu the clinician focusing on the patient's mood and affect on her behavior. Clinician processed with the patient regarding how she has been doing since the last follow up session. Clinician asked the patient how she has been doing on the increased dosage of Zoloft and added Mirtazapine. Clinician explained to the patient that it takes four to six weeks for psychotropic medications to fully get into her system. Clinician suggested to the patient that the next time her husband is upset with her son, grandchildren, or son's girlfriend to suggest that he speak directly to the source rather than getting her involved. Clinician explained to the patient that she is powerless over fixing other people's problems and suggested she try to work on fixing her own problems. Clinician encouraged the patient to continue utilizing her coping skills and trying to stay out of drama among her family members in the household.  Standardized Assessments completed: GAD-7 and PHQ 9  ASSESSMENT: Patient currently experiencing see above.   Patient may benefit from see above.  PLAN: 1. Follow up with behavioral health clinician on : two weeks or earlier if needed. 2. Behavioral recommendations: see above.  3. Referral(s): [place] (In Clinic) 4. "From scale of 1-10, how likely are you to follow plan?":   Bayard Hugger, LCSW

## 2019-03-07 ENCOUNTER — Ambulatory Visit: Payer: Self-pay | Admitting: Licensed Clinical Social Worker

## 2019-03-07 ENCOUNTER — Other Ambulatory Visit: Payer: Self-pay

## 2019-03-07 DIAGNOSIS — F331 Major depressive disorder, recurrent, moderate: Secondary | ICD-10-CM

## 2019-03-07 NOTE — BH Specialist Note (Signed)
Integrated Behavioral Health Follow Up Visit Via Phone.  MRN: [id number] Name: Karla Carter  Type of Service: [place] Interpretor:No. Interpretor Name and Language: Not applicable.  SUBJECTIVE: Karla Carter is a 63 y.o. female accompanied by herself. Patient was self referred for mental health. Patient reports the following symptoms/concerns: She notes that she is still struggling to sleep at night. She notes that she is not able to fall asleep until the sun comes up and will sleep about five or six hours at most. She reports that she has been trying to crochet before bed to help her fall asleep but its not helping. She explains that she tries to go to bed around 2 am or 3 am. She notes that she leaves the TV on to fall asleep. She notes that she stays busy by watching TV, dishes, stuff around the house, cooks, plays games on her phone, and takes care of her dog. She explains that she has the same routine everyday, unless one of her sons offers to cook dinner. She notes that she does not get out of the house very often and the last time she went out of the house was on Sunday to go to the store. She notes that she misses seeing her mom but does not want to risk her mom's health with COVID 19. She notes that she does not leave the house very much because she becomes anxious when driving and has someone drive her places, when she needs to go somewhere. She notes that she no longer has a routine because she is not working anymore. She denies suicidal and homicidal thoughts.  Duration of problem: ; Severity of problem: moderate  OBJECTIVE: Mood: Euthymic and Affect: Appropriate Risk of harm to self or others: No plan to harm self or others  LIFE CONTEXT: Family and Social: See above. School/Work: See above. Self-Care: See above. Life Changes: See above.   GOALS ADDRESSED: Patient will: 1.  Reduce symptoms of: insomnia  2.  Increase knowledge and/or  ability of: coping skills and healthy habits  3.  Demonstrate ability to: Increase healthy adjustment to current life circumstances  INTERVENTIONS: Interventions utilized:  Brief CBT was utilized by the clinician focusing on her insomnia, sleep hygiene, and behavioral activation. Clinician processed with the patient regarding how she has been doing since the last follow up session. Clinician re-iterated the concept of sleep hygiene discussed at the last follow up session and inquired if the patient has been trying to follow that kind of sleep schedule. Clinician explained to the patient that part of her issue is she is trying to go to bed too late and leaves the TV on to fall asleep too. Clinician explained to the patient that she will have a case consultation with Dr. Octavia Heir, MD, psychiatric consultant to discuss the option of increasing her dosage for Mirtazapine. Clinician asked the patient what her routine is on an average day. Clinician suggested to the patient that she is not burning off enough energy during the day to be able to get quality sleep and suggested to try to do some walking about ten to fifteen minutes twice a day.  Standardized Assessments completed: GAD-7 and PHQ 9  ASSESSMENT: Patient currently experiencing see above.   Patient may benefit from see above.  PLAN: 1. Follow up with behavioral health clinician on : two weeks or earlier if needed.  2. Behavioral recommendations: Case consultation with psychiatric consultant on Tuesday September 29th @ 9 am  to discuss psychotropic medications.  3. Referral(s): [place] (In Clinic) 4. "From scale of 1-10, how likely are you to follow plan?":  Bayard Hugger, LCSW

## 2019-03-10 ENCOUNTER — Other Ambulatory Visit: Payer: Self-pay

## 2019-03-10 MED ORDER — MIRTAZAPINE 7.5 MG PO TABS: 7.5000 mg | ORAL_TABLET | Freq: Every day | ORAL | 0 refills | Status: DC

## 2019-03-14 ENCOUNTER — Other Ambulatory Visit: Payer: Self-pay

## 2019-03-14 ENCOUNTER — Telehealth: Payer: Self-pay | Admitting: Gerontology

## 2019-03-14 DIAGNOSIS — Z8659 Personal history of other mental and behavioral disorders: Secondary | ICD-10-CM

## 2019-03-14 NOTE — Telephone Encounter (Signed)
Called on 9/29 @ 3:12. Scheduled a f/u appointment for 10/15 at 3 PM. Patient is aware of phone appt at 10/8 as well.

## 2019-03-15 ENCOUNTER — Other Ambulatory Visit: Payer: Self-pay

## 2019-03-15 DIAGNOSIS — Z8659 Personal history of other mental and behavioral disorders: Secondary | ICD-10-CM

## 2019-03-15 MED ORDER — SERTRALINE HCL 50 MG PO TABS: 150.0000 mg | ORAL_TABLET | Freq: Every day | ORAL | 1 refills | Status: DC

## 2019-03-15 MED ORDER — MIRTAZAPINE 15 MG PO TABS: 15.0000 mg | ORAL_TABLET | Freq: Every day | ORAL | 1 refills | Status: DC

## 2019-03-23 ENCOUNTER — Other Ambulatory Visit: Payer: Self-pay

## 2019-03-23 ENCOUNTER — Ambulatory Visit: Payer: Self-pay | Admitting: Licensed Clinical Social Worker

## 2019-03-23 DIAGNOSIS — F331 Major depressive disorder, recurrent, moderate: Secondary | ICD-10-CM

## 2019-03-23 NOTE — BH Specialist Note (Signed)
Integrated Behavioral Health Follow Up Visit Via Phone  MRN: [id number] Name: Karla Carter  Type of Service: [place] Interpretor:No. Interpretor Name and Language: not applicable.   SUBJECTIVE: Karla Carter is a 63 y.o. female accompanied by herself. Patient was referred by self for mental health. Patient reports the following symptoms/concerns: She notes that there has been an improvement in terms of her sleep. She reports that her mood has been better with an exception of her son's girlfriend telling her husband that her son was going to kick him out. She notes that her husband got upset and asked her to speak to her son. She explains that she diffused the situation and it turns out her son never made that statement. She notes that she gets frustrated with her husband because he is the not same man she married and does not take the initiative to do things. She notes that her husband's little quirks are starting to really get to her. She explains that some days are better than others. She notes that she has been crocheting again, playing games on her phone, color app on phone, going outside a little bit more, not staying in her room all the time, and doing more things as far as cleaning. She notes that some days she does not want to do anything but sit in her room and watch TV. She denies suicidal and homicidal thoughts.  Duration of problem: ; Severity of problem: mild  OBJECTIVE: Mood: Euthymic and Affect: Appropriate Risk of harm to self or others: No plan to harm self or others  LIFE CONTEXT: Family and Social: see above. School/Work: see above. Self-Care: see above. Life Changes: see above.  GOALS ADDRESSED: Patient will: 1.  Reduce symptoms of: stress  2.  Increase knowledge and/or ability of: coping skills and stress reduction  3.  Demonstrate ability to: Increase healthy adjustment to current life  circumstances  INTERVENTIONS: Interventions utilized:  Brief CBT was utilized by the clinician discussing her recent stress. Clinician processed with the patient regarding how she has been doing since the last follow up session. Clinician asked the patient if she has noticed a difference in her sleep since the dosage of Mirtazapine was increased in the last two weeks. Clinician explained to the patient that she cannot control other people's thoughts, feelings, or actions but can work on controlling her own. Clinician suggested that the patient offer to mediate conversations between her husband and son but to not have discussions on her husband's behalf. Clinician explained to the patient that it sounds like having to be the spokes person for her husband is very stressful for her. Clinician asked the patient if she has been sticking to her routine, a balance of responsibilities and things for enjoyment.  Standardized Assessments completed: GAD-7 and PHQ 9  ASSESSMENT: Patient currently experiencing see above.  Patient may benefit from see above.  PLAN: 1. Follow up with behavioral health clinician on : two weeks or earlier if needed. 2. Behavioral recommendations: see above. 3. Referral(s): [place] (In Clinic) 4. "From scale of 1-10, how likely are you to follow plan?":   Bayard Hugger, LCSW

## 2019-03-30 ENCOUNTER — Other Ambulatory Visit: Payer: Self-pay

## 2019-03-30 ENCOUNTER — Ambulatory Visit: Payer: Self-pay | Admitting: Gerontology

## 2019-03-30 ENCOUNTER — Encounter: Payer: Self-pay | Admitting: Gerontology

## 2019-03-30 VITALS — BP 145/79 | Ht 64.0 in | Wt 164.5 lb

## 2019-03-30 DIAGNOSIS — I10 Essential (primary) hypertension: Secondary | ICD-10-CM

## 2019-03-30 DIAGNOSIS — H9203 Otalgia, bilateral: Secondary | ICD-10-CM

## 2019-03-30 DIAGNOSIS — L989 Disorder of the skin and subcutaneous tissue, unspecified: Secondary | ICD-10-CM | POA: Insufficient documentation

## 2019-03-30 DIAGNOSIS — D1722 Benign lipomatous neoplasm of skin and subcutaneous tissue of left arm: Secondary | ICD-10-CM | POA: Insufficient documentation

## 2019-03-30 DIAGNOSIS — M25561 Pain in right knee: Secondary | ICD-10-CM

## 2019-03-30 MED ORDER — MELOXICAM 7.5 MG PO TABS: 7.5000 mg | ORAL_TABLET | Freq: Every day | ORAL | 2 refills | Status: DC

## 2019-03-30 MED ORDER — HYDROCORTISONE 1 % EX CREA: 1.0000 "application " | TOPICAL_CREAM | Freq: Two times a day (BID) | CUTANEOUS | 0 refills | Status: DC
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
## 2019-03-30 NOTE — Patient Instructions (Signed)

## 2019-04-04 ENCOUNTER — Telehealth: Payer: Self-pay | Admitting: Pharmacist

## 2019-04-04 NOTE — Telephone Encounter (Signed)
04/04/2019 9:10:07 AM - Ruthe Mannan refill called into to [facility]  04/04/2019 [facility] for refill on [employer] [id number] 7-10 business days to receive.Delos Haring

## 2019-04-05 ENCOUNTER — Telehealth: Payer: Self-pay | Admitting: Pharmacist

## 2019-04-05 NOTE — Telephone Encounter (Signed)
04/05/2019 2:13:58 PM - Ventolin refill online with [facility]  04/05/2019 Placed refill online with [facility] for Ventolin, to ship 06/01/2019, [id number].Karla Carter

## 2019-04-06 ENCOUNTER — Other Ambulatory Visit: Payer: Self-pay

## 2019-04-06 DIAGNOSIS — M25561 Pain in right knee: Secondary | ICD-10-CM

## 2019-04-06 DIAGNOSIS — Z7689 Persons encountering health services in other specified circumstances: Secondary | ICD-10-CM

## 2019-04-07 ENCOUNTER — Other Ambulatory Visit: Payer: Self-pay | Admitting: Urology

## 2019-04-07 DIAGNOSIS — Z1159 Encounter for screening for other viral diseases: Secondary | ICD-10-CM

## 2019-04-08 LAB — CBC WITH DIFFERENTIAL/PLATELET
Basophils Absolute: 0.1 10*3/uL (ref 0.0–0.2)
Basos: 1 %
EOS (ABSOLUTE): 0.3 10*3/uL (ref 0.0–0.4)
Eos: 2 %
Hematocrit: 39.8 % (ref 34.0–46.6)
Hemoglobin: 13.8 g/dL (ref 11.1–15.9)
Immature Grans (Abs): 0 10*3/uL (ref 0.0–0.1)
Immature Granulocytes: 0 %
Lymphocytes Absolute: 3.5 10*3/uL — ABNORMAL HIGH (ref 0.7–3.1)
Lymphs: 27 %
MCH: 29.6 pg (ref 26.6–33.0)
MCHC: 34.7 g/dL (ref 31.5–35.7)
MCV: 85 fL (ref 79–97)
Monocytes Absolute: 0.8 10*3/uL (ref 0.1–0.9)
Monocytes: 6 %
Neutrophils Absolute: 8.2 10*3/uL — ABNORMAL HIGH (ref 1.4–7.0)
Neutrophils: 64 %
Platelets: 237 10*3/uL (ref 150–450)
RBC: 4.66 x10E6/uL (ref 3.77–5.28)
RDW: 13.6 % (ref 11.7–15.4)
WBC: 12.9 10*3/uL — ABNORMAL HIGH (ref 3.4–10.8)

## 2019-04-08 LAB — LIPID PANEL
Chol/HDL Ratio: 5.6 ratio — ABNORMAL HIGH (ref 0.0–4.4)
Cholesterol, Total: 270 mg/dL — ABNORMAL HIGH (ref 100–199)
HDL: 48 mg/dL (ref >39)
LDL Chol Calc (NIH): 164 mg/dL — ABNORMAL HIGH (ref 0–99)
Triglycerides: 306 mg/dL — ABNORMAL HIGH (ref 0–149)
VLDL Cholesterol Cal: 58 mg/dL — ABNORMAL HIGH (ref 5–40)

## 2019-04-08 LAB — SEDIMENTATION RATE: Sed Rate: 31 mm/hr (ref 0–40)

## 2019-04-08 LAB — COMPREHENSIVE METABOLIC PANEL
ALT: 80 IU/L — ABNORMAL HIGH (ref 0–32)
AST: 61 IU/L — ABNORMAL HIGH (ref 0–40)
Albumin/Globulin Ratio: 1.6 (ref 1.2–2.2)
Albumin: 4.4 g/dL (ref 3.8–4.8)
Alkaline Phosphatase: 116 IU/L (ref 39–117)
BUN/Creatinine Ratio: 11 — ABNORMAL LOW (ref 12–28)
BUN: 10 mg/dL (ref 8–27)
Bilirubin Total: 0.3 mg/dL (ref 0.0–1.2)
CO2: 20 mmol/L (ref 20–29)
Calcium: 9.5 mg/dL (ref 8.7–10.3)
Chloride: 104 mmol/L (ref 96–106)
Creatinine, Ser: 0.92 mg/dL (ref 0.57–1.00)
GFR calc Af Amer: 77 mL/min/{1.73_m2} (ref >59)
GFR calc non Af Amer: 66 mL/min/{1.73_m2} (ref >59)
Globulin, Total: 2.7 g/dL (ref 1.5–4.5)
Glucose: 153 mg/dL — ABNORMAL HIGH (ref 65–99)
Potassium: 4.5 mmol/L (ref 3.5–5.2)
Sodium: 138 mmol/L (ref 134–144)
Total Protein: 7.1 g/dL (ref 6.0–8.5)

## 2019-04-08 LAB — TSH: TSH: 1.64 u[IU]/mL (ref 0.450–4.500)

## 2019-04-08 LAB — RHEUMATOID FACTOR: Rheumatoid fact SerPl-aCnc: <10 IU/mL (ref 0.0–13.9)

## 2019-04-08 LAB — HEMOGLOBIN A1C
Est. average glucose Bld gHb Est-mCnc: 128 mg/dL
Hgb A1c MFr Bld: 6.1 % — ABNORMAL HIGH (ref 4.8–5.6)

## 2019-04-08 LAB — CYCLIC CITRUL PEPTIDE ANTIBODY, IGG/IGA: Cyclic Citrullin Peptide Ab: 18 units (ref 0–19)

## 2019-04-08 LAB — C-REACTIVE PROTEIN: CRP: 11 mg/L — ABNORMAL HIGH (ref 0–10)

## 2019-04-13 ENCOUNTER — Ambulatory Visit: Payer: Self-pay | Admitting: Licensed Clinical Social Worker

## 2019-04-13 ENCOUNTER — Other Ambulatory Visit: Payer: Self-pay | Admitting: Gerontology

## 2019-04-13 ENCOUNTER — Other Ambulatory Visit: Payer: Self-pay

## 2019-04-13 DIAGNOSIS — R748 Abnormal levels of other serum enzymes: Secondary | ICD-10-CM

## 2019-04-13 DIAGNOSIS — F331 Major depressive disorder, recurrent, moderate: Secondary | ICD-10-CM

## 2019-04-13 NOTE — BH Specialist Note (Signed)
Integrated Behavioral Health Follow Up Visit Via Phone  MRN: [id number] Name: Karla Carter  Type of Service: [place] Interpretor:Yes.   Interpretor Name and Language: not applicable.   SUBJECTIVE: Karla Carter is a 63 y.o. female accompanied by herself. Patient was referred by self for mental health. Patient reports the following symptoms/concerns: She reports that she has her good nights and bad nights in terms of sleep. She notes that she is trying to take the Mirtazapine around the same time each night. She notes that her sleep is better than it was before. She explains that she has to sleep with a fan on because she gets hot and needs the noise to fall asleep to. She reports that things have been better with her family at the house. She reports that she is getting along better with her husband but at times they tend to experience miscommunication about things. She notes that she has been crocheting a lot more often. She explains that she has good days and bad days in terms of her mood. She denies suicidal and homicidal thoughts.  Duration of problem: ; Severity of problem: mild  OBJECTIVE: Mood: Euthymic and Affect: Appropriate  Risk of harm to self or others: No plan to harm self or others  LIFE CONTEXT: Family and Social: see above. School/Work: see above. Self-Care: see above. Life Changes: see above.   GOALS ADDRESSED: Patient will: 1.  Reduce symptoms of: depression  2.  Increase knowledge and/or ability of: coping skills, self-management skills and stress reduction  3.  Demonstrate ability to: Increase healthy adjustment to current life circumstances  INTERVENTIONS: Interventions utilized:  Brief CBT was utilized by the clinician focusing on the patient's mood and affect on behavior. Clinician processed with the patient regarding how she has been doing since the last follow up session. Clinician discussed with the patient if she  has noticed a difference in her mood and symptoms of insomnia since starting the increased dosage of Mirtazapine. Clinician explained to the patient that it sounds like she is doing a lot better. Clinician encouraged the patient to stick to a routine, crocheting, things for enjoyment, and self care.  Standardized Assessments completed: GAD-7 and PHQ 9  ASSESSMENT: Patient currently experiencing see above.   Patient may benefit from see above.  PLAN: 1. Follow up with behavioral health clinician on : two weeks or earlier if needed.  2. Behavioral recommendations: see above.  3. Referral(s): [place] (In Clinic) 4. "From scale of 1-10, how likely are you to follow plan?":   Bayard Hugger, LCSW

## 2019-04-18 ENCOUNTER — Ambulatory Visit: Payer: Self-pay | Admitting: Surgery

## 2019-04-19 ENCOUNTER — Telehealth: Payer: Self-pay

## 2019-04-19 NOTE — Telephone Encounter (Signed)
Called pt on 11/04 at 2:33pm but could not LVM - pt needs to reschedule 11/19 appt since Benjamine Mola will not be in that day

## 2019-04-27 ENCOUNTER — Other Ambulatory Visit: Payer: Self-pay

## 2019-04-27 ENCOUNTER — Ambulatory Visit: Payer: Self-pay | Admitting: Licensed Clinical Social Worker

## 2019-04-27 DIAGNOSIS — F331 Major depressive disorder, recurrent, moderate: Secondary | ICD-10-CM

## 2019-04-27 DIAGNOSIS — Z8659 Personal history of other mental and behavioral disorders: Secondary | ICD-10-CM

## 2019-04-27 MED ORDER — SERTRALINE HCL 50 MG PO TABS: 150.0000 mg | ORAL_TABLET | Freq: Every day | ORAL | 1 refills | Status: DC

## 2019-04-27 NOTE — BH Specialist Note (Signed)
Integrated Behavioral Health Follow Up Visit Via Phone  MRN: [id number] Name: Karla Carter  Type of Service: [place] Interpretor:No. Interpretor Name and Language: see above.  SUBJECTIVE: Karla Carter is a 63 y.o. female accompanied by herself. Patient was referred by self for mental health. Patient reports the following symptoms/concerns: She notes that she has been doing pretty well. She explains that she visited with her mom and baby sister from [place] in the last two weeks. She reports that she has been doing better and has felt a lot better since she got to see her mom and sister. She notes that regardless of how soon she takes the Mirtazapine and utilizing the sleep hygiene techniques that there are more nights than not that she is unable to get any sleep. She notes that she has had very few days of depression. She denies suicidal and homicidal thoughts.  Duration of problem: ; Severity of problem: mild  OBJECTIVE: Mood: Euthymic and Affect: Appropriate Risk of harm to self or others: No plan to harm self or others  LIFE CONTEXT: Family and Social:  School/Work:  Self-Care:  Life Changes:   GOALS ADDRESSED: Patient will: 1.  Reduce symptoms of: insomnia  2.  Increase knowledge and/or ability of: healthy habits  3.  Demonstrate ability to: Increase healthy adjustment to current life circumstances  INTERVENTIONS: Interventions utilized:  Brief CBT was utilized by the clinician focusing on the patient's insomnia. Clinician processed with the patient regarding how she has been doing since the last follow up session. Clinician explained to the patient that she will have a case consultation with Dr. Octavia Heir, MD, psychiatric consultant on Tuesday November 17th @ 9 am to discuss her insomnia. Clinician asked the patient if she has been utilizing practicing sleep hygiene, behavioral activation, and trying to avoid blue light. Clinician  explained to the patient that the psychiatric consultant may want her to have a sleep study due to showing a good improvement in her symptoms of depression and anxiety.  Standardized Assessments completed: GAD-7 and PHQ 9  ASSESSMENT: Patient currently experiencing see above.   Patient may benefit from see above.  PLAN: 1. Follow up with behavioral health clinician on : two weeks or earlier if needed. 2. Behavioral recommendations: see above. 3. Referral(s): [place] (In Clinic) 4. "From scale of 1-10, how likely are you to follow plan?":   Bayard Hugger, LCSW

## 2019-05-03 ENCOUNTER — Other Ambulatory Visit: Payer: Self-pay

## 2019-05-03 ENCOUNTER — Ambulatory Visit: Payer: Self-pay | Admitting: Surgery

## 2019-05-03 MED ORDER — MIRTAZAPINE 15 MG PO TABS: 15.0000 mg | ORAL_TABLET | Freq: Every day | ORAL | 1 refills | Status: DC

## 2019-05-04 ENCOUNTER — Ambulatory Visit: Payer: Self-pay | Admitting: Gerontology

## 2019-05-10 ENCOUNTER — Other Ambulatory Visit: Payer: Self-pay

## 2019-05-10 ENCOUNTER — Ambulatory Visit: Payer: Self-pay | Admitting: Gerontology

## 2019-05-10 VITALS — BP 148/85 | HR 83 | Ht 64.0 in | Wt 166.0 lb

## 2019-05-10 DIAGNOSIS — L989 Disorder of the skin and subcutaneous tissue, unspecified: Secondary | ICD-10-CM

## 2019-05-10 DIAGNOSIS — R7303 Prediabetes: Secondary | ICD-10-CM | POA: Insufficient documentation

## 2019-05-10 DIAGNOSIS — D1722 Benign lipomatous neoplasm of skin and subcutaneous tissue of left arm: Secondary | ICD-10-CM

## 2019-05-10 DIAGNOSIS — F17200 Nicotine dependence, unspecified, uncomplicated: Secondary | ICD-10-CM | POA: Insufficient documentation

## 2019-05-10 DIAGNOSIS — I10 Essential (primary) hypertension: Secondary | ICD-10-CM

## 2019-05-10 DIAGNOSIS — E785 Hyperlipidemia, unspecified: Secondary | ICD-10-CM | POA: Insufficient documentation

## 2019-05-10 DIAGNOSIS — R748 Abnormal levels of other serum enzymes: Secondary | ICD-10-CM | POA: Insufficient documentation

## 2019-05-10 MED ORDER — METFORMIN HCL 1000 MG PO TABS: 500.0000 mg | ORAL_TABLET | Freq: Every day | ORAL | 0 refills | Status: DC

## 2019-05-10 MED ORDER — ROSUVASTATIN CALCIUM 5 MG PO TABS: 5.0000 mg | ORAL_TABLET | Freq: Every day | ORAL | 0 refills | Status: DC

## 2019-05-10 MED ORDER — BLOOD PRESSURE MONITOR KIT: 1.0000 | PACK | Freq: Every day | 0 refills | Status: AC
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
## 2019-05-10 NOTE — Patient Instructions (Addendum)
Low Fat and Cholesterol Restricted Eating Plan Getting too much fat and cholesterol in your diet may cause health problems. Choosing the right foods helps keep your fat and cholesterol at normal levels. This can keep you from getting certain diseases. Your doctor may recommend an eating plan that includes:  Total fat: ______% or less of total calories a day.  Saturated fat: ______% or less of total calories a day.  Cholesterol: less than _________mg a day.  Fiber: ______g a day. What are tips for following this plan? Meal planning  At meals, divide your plate into four equal parts: ? Fill one-half of your plate with vegetables and green salads. ? Fill one-fourth of your plate with whole grains. ? Fill one-fourth of your plate with low-fat (lean) protein foods.  Eat fish that is high in omega-3 fats at least two times a week. This includes mackerel, tuna, sardines, and salmon.  Eat foods that are high in fiber, such as whole grains, beans, apples, broccoli, carrots, peas, and barley. General tips   Work with your doctor to lose weight if you need to.  Avoid: ? Foods with added sugar. ? Fried foods. ? Foods with partially hydrogenated oils.  Limit alcohol intake to no more than 1 drink a day for nonpregnant women and 2 drinks a day for men. One drink equals 12 oz of beer, 5 oz of wine, or 1 oz of hard liquor. Reading food labels  Check food labels for: ? Trans fats. ? Partially hydrogenated oils. ? Saturated fat (g) in each serving. ? Cholesterol (mg) in each serving. ? Fiber (g) in each serving.  Choose foods with healthy fats, such as: ? Monounsaturated fats. ? Polyunsaturated fats. ? Omega-3 fats.  Choose grain products that have whole grains. Look for the word "whole" as the first word in the ingredient list. Cooking  Cook foods using low-fat methods. These include baking, boiling, grilling, and broiling.  Eat more home-cooked foods. Eat at restaurants and  buffets less often.  Avoid cooking using saturated fats, such as butter, cream, palm oil, palm kernel oil, and coconut oil. Recommended foods  Fruits  All fresh, canned (in natural juice), or frozen fruits. Vegetables  Fresh or frozen vegetables (raw, steamed, roasted, or grilled). Green salads. Grains  Whole grains, such as whole wheat or whole grain breads, crackers, cereals, and pasta. Unsweetened oatmeal, bulgur, barley, quinoa, or brown rice. Corn or whole wheat flour tortillas. Meats and other protein foods  Ground beef (85% or leaner), grass-fed beef, or beef trimmed of fat. Skinless chicken or [place]. Ground chicken or [place]. Pork trimmed of fat. All fish and seafood. Egg whites. Dried beans, peas, or lentils. Unsalted nuts or seeds. Unsalted canned beans. Nut butters without added sugar or oil. Dairy  Low-fat or nonfat dairy products, such as skim or 1% milk, 2% or reduced-fat cheeses, low-fat and fat-free ricotta or cottage cheese, or plain low-fat and nonfat yogurt. Fats and oils  Tub margarine without trans fats. Light or reduced-fat mayonnaise and salad dressings. Avocado. Olive, canola, sesame, or safflower oils. The items listed above may not be a complete list of foods and beverages you can eat. Contact a dietitian for more information. Foods to avoid Fruits  Canned fruit in heavy syrup. Fruit in cream or butter sauce. Fried fruit. Vegetables  Vegetables cooked in cheese, cream, or butter sauce. Fried vegetables. Grains  White bread. White pasta. White rice. Cornbread. Bagels, pastries, and croissants. Crackers and snack foods that contain trans  fat and hydrogenated oils. Meats and other protein foods  Fatty cuts of meat. Ribs, chicken wings, bacon, sausage, bologna, salami, chitterlings, fatback, hot dogs, bratwurst, and packaged lunch meats. Liver and organ meats. Whole eggs and egg yolks. Chicken and [place] with skin. Fried meat. Dairy  Whole or 2% milk,  cream, half-and-half, and cream cheese. Whole milk cheeses. Whole-fat or sweetened yogurt. Full-fat cheeses. Nondairy creamers and whipped toppings. Processed cheese, cheese spreads, and cheese curds. Beverages  Alcohol. Sugar-sweetened drinks such as sodas, lemonade, and fruit drinks. Fats and oils  Butter, stick margarine, lard, shortening, ghee, or bacon fat. Coconut, palm kernel, and palm oils. Sweets and desserts  Corn syrup, sugars, honey, and molasses. Candy. Jam and jelly. Syrup. Sweetened cereals. Cookies, pies, cakes, donuts, muffins, and ice cream. The items listed above may not be a complete list of foods and beverages you should avoid. Contact a dietitian for more information. Summary  Choosing the right foods helps keep your fat and cholesterol at normal levels. This can keep you from getting certain diseases.  At meals, fill one-half of your plate with vegetables and green salads.  Eat high-fiber foods, like whole grains, beans, apples, carrots, peas, and barley.  Limit added sugar, saturated fats, alcohol, and fried foods. This information is not intended to replace advice given to you by your health care provider. Make sure you discuss any questions you have with your health care provider. Document Released: 12/01/2011 Document Revised: 02/02/2018 Document Reviewed: 02/16/2017 Elsevier Patient Education  2020 [place] for Diabetes Mellitus, Adult  Carbohydrate counting is a method of keeping track of how many carbohydrates you eat. Eating carbohydrates naturally increases the amount of sugar (glucose) in the blood. Counting how many carbohydrates you eat helps keep your blood glucose within normal limits, which helps you manage your diabetes (diabetes mellitus). It is important to know how many carbohydrates you can safely have in each meal. This is different for every person. A diet and nutrition specialist (registered dietitian) can help you  make a meal plan and calculate how many carbohydrates you should have at each meal and snack. Carbohydrates are found in the following foods:  Grains, such as breads and cereals.  Dried beans and soy products.  Starchy vegetables, such as potatoes, peas, and corn.  Fruit and fruit juices.  Milk and yogurt.  Sweets and snack foods, such as cake, cookies, candy, chips, and soft drinks. How do I count carbohydrates? There are two ways to count carbohydrates in food. You can use either of the methods or a combination of both. Reading "Nutrition Facts" on packaged food The "Nutrition Facts" list is included on the labels of almost all packaged foods and beverages in the [place] It includes:  The serving size.  Information about nutrients in each serving, including the grams (g) of carbohydrate per serving. To use the "Nutrition Facts":  Decide how many servings you will have.  Multiply the number of servings by the number of carbohydrates per serving.  The resulting number is the total amount of carbohydrates that you will be having. Learning standard serving sizes of other foods When you eat carbohydrate foods that are not packaged or do not include "Nutrition Facts" on the label, you need to measure the servings in order to count the amount of carbohydrates:  Measure the foods that you will eat with a food scale or measuring cup, if needed.  Decide how many standard-size servings you will eat.  Multiply the number  of servings by 15. Most carbohydrate-rich foods have about 15 g of carbohydrates per serving. ? For example, if you eat 8 oz (170 g) of strawberries, you will have eaten 2 servings and 30 g of carbohydrates (2 servings x 15 g = 30 g).  For foods that have more than one food mixed, such as soups and casseroles, you must count the carbohydrates in each food that is included. The following list contains standard serving sizes of common carbohydrate-rich foods. Each of these  servings has about 15 g of carbohydrates:   hamburger bun or  English muffin.   oz (15 mL) syrup.   oz (14 g) jelly.  1 slice of bread.  1 six-inch tortilla.  3 oz (85 g) cooked rice or pasta.  4 oz (113 g) cooked dried beans.  4 oz (113 g) starchy vegetable, such as peas, corn, or potatoes.  4 oz (113 g) hot cereal.  4 oz (113 g) mashed potatoes or  of a large baked potato.  4 oz (113 g) canned or frozen fruit.  4 oz (120 mL) fruit juice.  4-6 crackers.  6 chicken nuggets.  6 oz (170 g) unsweetened dry cereal.  6 oz (170 g) plain fat-free yogurt or yogurt sweetened with artificial sweeteners.  8 oz (240 mL) milk.  8 oz (170 g) fresh fruit or one small piece of fruit.  24 oz (680 g) popped popcorn. Example of carbohydrate counting Sample meal  3 oz (85 g) chicken breast.  6 oz (170 g) brown rice.  4 oz (113 g) corn.  8 oz (240 mL) milk.  8 oz (170 g) strawberries with sugar-free whipped topping. Carbohydrate calculation 1. Identify the foods that contain carbohydrates: ? Rice. ? Corn. ? Milk. ? Strawberries. 2. Calculate how many servings you have of each food: ? 2 servings rice. ? 1 serving corn. ? 1 serving milk. ? 1 serving strawberries. 3. Multiply each number of servings by 15 g: ? 2 servings rice x 15 g = 30 g. ? 1 serving corn x 15 g = 15 g. ? 1 serving milk x 15 g = 15 g. ? 1 serving strawberries x 15 g = 15 g. 4. Add together all of the amounts to find the total grams of carbohydrates eaten: ? 30 g + 15 g + 15 g + 15 g = 75 g of carbohydrates total. Summary  Carbohydrate counting is a method of keeping track of how many carbohydrates you eat.  Eating carbohydrates naturally increases the amount of sugar (glucose) in the blood.  Counting how many carbohydrates you eat helps keep your blood glucose within normal limits, which helps you manage your diabetes.  A diet and nutrition specialist (registered dietitian) can help you  make a meal plan and calculate how many carbohydrates you should have at each meal and snack. This information is not intended to replace advice given to you by your health care provider. Make sure you discuss any questions you have with your health care provider. Document Released: 06/01/2005 Document Revised: 12/24/2016 Document Reviewed: 11/13/2015 Elsevier Patient Education  2020 [employer].

## 2019-05-11 LAB — HEPATITIS PANEL, ACUTE
Hep A IgM: NEGATIVE
Hep B C IgM: NEGATIVE
Hep C Virus Ab: 0.3 s/co ratio (ref 0.0–0.9)
Hepatitis B Surface Ag: NEGATIVE

## 2019-05-16 ENCOUNTER — Ambulatory Visit: Payer: Self-pay | Admitting: Licensed Clinical Social Worker

## 2019-05-16 ENCOUNTER — Telehealth: Payer: Self-pay | Admitting: *Deleted

## 2019-05-16 ENCOUNTER — Other Ambulatory Visit: Payer: Self-pay

## 2019-05-16 ENCOUNTER — Encounter: Payer: Self-pay | Admitting: *Deleted

## 2019-05-16 DIAGNOSIS — F331 Major depressive disorder, recurrent, moderate: Secondary | ICD-10-CM

## 2019-05-16 DIAGNOSIS — Z87891 Personal history of nicotine dependence: Secondary | ICD-10-CM

## 2019-05-16 DIAGNOSIS — Z122 Encounter for screening for malignant neoplasm of respiratory organs: Secondary | ICD-10-CM

## 2019-05-16 NOTE — BH Specialist Note (Signed)
Integrated Behavioral Health Follow Up Visit Via Phone  MRN: [id number] Name: Karla Carter  Type of Service: [place] Interpretor:No. Interpretor Name and Language: not applicable.   SUBJECTIVE: Karla Carter is a 63 y.o. female accompanied by herself. Patient was referred by self for mental health. Patient reports the following symptoms/concerns: She reports that she has been doing okay. She explains that she ended up cooking for her family for Thanksgiving. She notes that she is smoking less than half a pack of cigarettes a day. She notes that she drinks two cups of a coffee a day and about one coke zero a day in a half. She reports that she is willing to try the Melatonin 3 mg at bedtime but is not so sure there is a point to a sleep study. She reports that she gets the knot removed from her arm next week and is excited about it. She explains that she will also have a lung cancer screening next week. She explains that things at home are getting better an there is not a whole lot of drama right now. She reports that her husband got baptized and his mood has improved. She notes that she has been making her family and friends scarves. She denies suicidal and homicidal thoughts.  Duration of problem: ; Severity of problem: moderate  OBJECTIVE: Mood: Euthymic and Affect: Appropriate Risk of harm to self or others: No plan to harm self or others  LIFE CONTEXT: Family and Social: see above. School/Work: see above. Self-Care: see above. Life Changes: see above.  GOALS ADDRESSED: Patient will: 1.  Reduce symptoms of: insomnia  2.  Increase knowledge and/or ability of: healthy habits  3.  Demonstrate ability to: Increase healthy adjustment to current life circumstances  INTERVENTIONS: Interventions utilized:  Brief CBT was utilized by the clinician discussing sleep hygiene. Clinician processed with the patient regarding how she has been doing since  the last follow up session. Clinician explained to the patient that due to the improvement in her depression that Dr. Octavia Heir, MD, psychiatric consultant thinks that her insomnia could be more of a medical problem, which is why he recommended that she have a sleep study. Clinician explained to the patient that in addition, Dr. Octavia Heir, MD, recommended that she try Melatonin 3 mg over the counter for sleep. Clinician provided psycho education about the role that nicotine can play with your sleep and encouraged her to cut down to a few cigarettes a day.  Standardized Assessments completed: PHQ 9  ASSESSMENT: Patient currently experiencing see above.  Patient may benefit from see above. PLAN: 1. Follow up with behavioral health clinician on : two weeks or earlier if needed.  2. Behavioral recommendations: see above. 3. Referral(s): [place] (In Clinic) 4. "From scale of 1-10, how likely are you to follow plan?":   Bayard Hugger, LCSW

## 2019-05-16 NOTE — Telephone Encounter (Signed)
Received referral for initial lung cancer screening scan. Contacted patient and obtained smoking history,(current, 36 pack year) as well as answering questions related to screening process. Patient denies signs of lung cancer such as weight loss or hemoptysis. Patient denies comorbidity that would prevent curative treatment if lung cancer were found. Patient is scheduled for shared decision making visit and CT scan on 05/23/19 at 2pm.

## 2019-05-23 ENCOUNTER — Ambulatory Visit
Admission: RE | Admit: 2019-05-23 | Discharge: 2019-05-23 | Disposition: A | Discharge status: Home / Self Care | Payer: Self-pay | Source: Ambulatory Visit | Attending: Oncology | Admitting: Oncology

## 2019-05-23 ENCOUNTER — Other Ambulatory Visit: Payer: Self-pay

## 2019-05-23 ENCOUNTER — Inpatient Hospital Stay: Payer: Self-pay | Attending: Oncology | Admitting: Oncology

## 2019-05-23 DIAGNOSIS — Z122 Encounter for screening for malignant neoplasm of respiratory organs: Secondary | ICD-10-CM

## 2019-05-23 DIAGNOSIS — F17210 Nicotine dependence, cigarettes, uncomplicated: Secondary | ICD-10-CM

## 2019-05-23 DIAGNOSIS — Z87891 Personal history of nicotine dependence: Secondary | ICD-10-CM | POA: Insufficient documentation

## 2019-05-23 NOTE — Progress Notes (Signed)
Virtual Visit via Video Note  I connected with Karla Carter on 05/23/19 at  2:00 PM EST by a video enabled telemedicine application and verified that I am speaking with the correct person using two identifiers.  Location: Patient: [facility] Provider: Home   I discussed the limitations of evaluation and management by telemedicine and the availability of in person appointments. The patient expressed understanding and agreed to proceed.  I discussed the assessment and treatment plan with the patient. The patient was provided an opportunity to ask questions and all were answered. The patient agreed with the plan and demonstrated an understanding of the instructions.   The patient was advised to call back or seek an in-person evaluation if the symptoms worsen or if the condition fails to improve as anticipated.   In accordance with CMS guidelines, patient has met eligibility criteria including age, absence of signs or symptoms of lung cancer.  Social History   Tobacco Use  . Smoking status: Current Every Day Smoker    Packs/day: 0.75    Years: 48.00    Pack years: 36.00    Types: Cigarettes  . Smokeless tobacco: Never Used  Substance Use Topics  . Alcohol use: No  . Drug use: Never      A shared decision-making session was conducted prior to the performance of CT scan. This includes one or more decision aids, includes benefits and harms of screening, follow-up diagnostic testing, over-diagnosis, false positive rate, and total radiation exposure.   Counseling on the importance of adherence to annual lung cancer LDCT screening, impact of co-morbidities, and ability or willingness to undergo diagnosis and treatment is imperative for compliance of the program.   Counseling on the importance of continued smoking cessation for former smokers; the importance of smoking cessation for current smokers, and information about tobacco cessation interventions have been given to patient including [place] and [facility] quit [place] programs.   Written order for lung cancer screening with LDCT has been given to the patient and any and all questions have been answered to the best of my abilities.    Yearly follow up will be coordinated by Burgess Estelle, Thoracic Navigator.  I provided 15 minutes of face-to-face video visit time during this encounter, and > 50% was spent counseling as documented under my assessment & plan.   Jacquelin Hawking, NP

## 2019-05-24 ENCOUNTER — Encounter: Payer: Self-pay | Admitting: Surgery

## 2019-05-24 ENCOUNTER — Other Ambulatory Visit: Payer: Self-pay | Admitting: Surgery

## 2019-05-24 ENCOUNTER — Ambulatory Visit (INDEPENDENT_AMBULATORY_CARE_PROVIDER_SITE_OTHER): Payer: Self-pay | Admitting: Surgery

## 2019-05-24 VITALS — BP 173/93 | HR 86 | Temp 97.2°F | Ht 64.0 in | Wt 165.6 lb

## 2019-05-24 DIAGNOSIS — D1722 Benign lipomatous neoplasm of skin and subcutaneous tissue of left arm: Secondary | ICD-10-CM

## 2019-05-24 NOTE — Progress Notes (Signed)
05/24/2019  Reason for Visit: Left upper arm mass  Referring Provider:  Caryl Asp, NP  History of Present Illness: Karla Carter is a 63 y.o. female presented today for evaluation of a left upper extremity mass.  The patient reports that she has had this since likely 2007 after having a flu shot on the left shoulder.  The mass started initially is very small but has grown in size to the size of a softball.  The patient reports that sometimes she may get shooting pains down her left arm but otherwise has not had any issues with any sensory or motor changes.  She reports that the mass has been growing slowly in size.  Denies any issues with infection, erythema or induration of that area, or drainage from that area either.  Denies any prior trauma to that area.  Past Medical History: Past Medical History:  Diagnosis Date  . Anxiety   . COPD (chronic obstructive pulmonary disease) ([place])   . Depression   . GERD (gastroesophageal reflux disease)   . Hypertension      Past Surgical History: Past Surgical History:  Procedure Laterality Date  . ABDOMINAL HYSTERECTOMY    . APPENDECTOMY    . COLON SURGERY    . TONSILLECTOMY      Home Medications: Prior to Admission medications   Medication Sig Start Date End Date Taking? Authorizing Provider  albuterol (PROVENTIL HFA;VENTOLIN HFA) 108 (90 Base) MCG/ACT inhaler INHALE 2 PUFFS INTO THE LUNGS EVERY 4 HOURS AS NEEDED FOR WHEEZING ORSHORTNESS OF BREATH 08/23/18  Yes Iloabachie, Chioma E, NP  amLODipine (NORVASC) 10 MG tablet Take 1 tablet (10 mg total) by mouth daily. 02/02/19  Yes Doles-Johnson, Teah, NP  Blood Pressure Monitor KIT 1 kit by Does not apply route daily. 05/10/19  Yes Iloabachie, Chioma E, NP  meloxicam (MOBIC) 7.5 MG tablet Take 1 tablet (7.5 mg total) by mouth daily. 03/30/19  Yes Iloabachie, Chioma E, NP  metFORMIN (GLUCOPHAGE) 1000 MG tablet Take 0.5 tablets (500 mg total) by mouth daily with breakfast. 05/10/19  Yes  Iloabachie, Chioma E, NP  mirtazapine (REMERON) 15 MG tablet Take 1 tablet (15 mg total) by mouth at bedtime. 05/03/19 06/02/19 Yes Iloabachie, Chioma E, NP  mometasone-formoterol (DULERA) 100-5 MCG/ACT AERO Inhale 2 puffs into the lungs 2 (two) times daily. Patient taking differently: Inhale 2 puffs into the lungs daily.  07/31/15  Yes Henreitta Leber, MD  rosuvastatin (CRESTOR) 5 MG tablet Take 1 tablet (5 mg total) by mouth daily. 05/10/19  Yes Iloabachie, Chioma E, NP  sertraline (ZOLOFT) 50 MG tablet Take 3 tablets (150 mg total) by mouth daily. 04/27/19 05/27/19 Yes Iloabachie, Chioma E, NP    Allergies: Allergies  Allergen Reactions  . Codeine Rash    Social History:  reports that she has been smoking cigarettes. She has a 36.00 pack-year smoking history. She has never used smokeless tobacco. She reports that she does not drink alcohol or use drugs.   Family History: Family History  Problem Relation Age of Onset  . Cancer Father   . Cancer Maternal Aunt     Review of Systems: Review of Systems  Constitutional: Negative for chills and fever.  HENT: Negative for hearing loss.   Respiratory: Negative for shortness of breath.   Cardiovascular: Negative for chest pain.  Gastrointestinal: Negative for abdominal pain, nausea and vomiting.  Genitourinary: Negative for dysuria.  Musculoskeletal: Negative for myalgias.  Skin: Negative for rash.  Neurological: Negative for dizziness.  Psychiatric/Behavioral: Negative for depression.    Physical Exam BP (!) 173/93   Pulse 86   Temp (!) 97.2 F (36.2 C) (Temporal)   Ht _0  (1.626 m)   Wt 165 lb 9.6 oz (75.1 kg)   SpO2 96%   BMI 28.43 kg/m  CONSTITUTIONAL: No acute distress HEENT:  Normocephalic, atraumatic, extraocular motion intact. NECK: Trachea is midline, and there is no jugular venous distension.  RESPIRATORY:  Lungs are clear, and breath sounds are equal bilaterally. Normal respiratory effort without pathologic  use of accessory muscles. CARDIOVASCULAR: Heart is regular without murmurs, gallops, or rubs. GI: The abdomen is soft, nondistended, nontender.  MUSCULOSKELETAL: Left upper extremity has an approximately 14 cm x 8 cm mass in the left upper arm laterally.  This is mobile with no tenderness to palpation.  It is soft with well demarcated edges.  There is no erythema or drainage. SKIN: Skin turgor is normal. There are no pathologic skin lesions.  NEUROLOGIC: Patient has intact motor and sensory function of the left upper extremity. PSYCH:  Alert and oriented to person, place and time. Affect is normal.  Laboratory Analysis: No results found for this or any previous visit (from the past 24 hour(s)).  Imaging: No results found.  Assessment and Plan: This is a 63 y.o. female with slowly growing left upper extremity mass.  -Discussed with the patient that this could be a lipoma which has been slowly growing through time.  I do not think this would be a hematoma that could have been as a result of the injection, as this should have resolved already. --The mass is large enough, that I want to evaluate the surrounding anatomy better.  I think it'd be best to get a CT scan of the left upper extremity to evaluate the mass, the borders, and surrounding vessels.  This will help for surgery planning. --Patient will follow up with me after the CT scan is done to discuss the surgery and schedule date.  Patient is in agreement with this plan and all of her questions have been answered.  Face-to-face time spent with the patient and care providers was 60 minutes, with more than 50% of the time spent counseling, educating, and coordinating care of the patient.     Melvyn Neth, [facility]

## 2019-05-24 NOTE — Patient Instructions (Addendum)
Patient has been scheduled for a CT Left Upper Extremity with contrast at 11:30a on 05/30/19 at [facility] with arrival time of 11:15a.  Prep: Liquids only 4 hours prior to procedure.  Lipoma  A lipoma is a noncancerous (benign) tumor that is made up of fat cells. This is a very common type of soft-tissue growth. Lipomas are usually found under the skin (subcutaneous). They may occur in any tissue of the body that contains fat. Common areas for lipomas to appear include the back, shoulders, buttocks, and thighs.  Lipomas grow slowly, and they are usually painless. Most lipomas do not cause problems and do not require treatment. What are the causes? The cause of this condition is not known. What increases the risk? You are more likely to develop this condition if:  You are 81-56 years old.  You have a family history of lipomas. What are the signs or symptoms? A lipoma usually appears as a small, round bump under the skin. In most cases, the lump will:  Feel soft or rubbery.  Not cause pain or other symptoms. However, if a lipoma is located in an area where it pushes on nerves, it can become painful or cause other symptoms. How is this diagnosed? A lipoma can usually be diagnosed with a physical exam. You may also have tests to confirm the diagnosis and to rule out other conditions. Tests may include:  Imaging tests, such as a CT scan or MRI.  Removal of a tissue sample to be looked at under a microscope (biopsy). How is this treated? Treatment for this condition depends on the size of the lipoma and whether it is causing any symptoms.  For small lipomas that are not causing problems, no treatment is needed.  If a lipoma is bigger or it causes problems, surgery may be done to remove the lipoma. Lipomas can also be removed to improve appearance. Most often, the procedure is done after applying a medicine that numbs the area (local anesthetic). Follow these instructions at  home:  Watch your lipoma for any changes.  Keep all follow-up visits as told by your health care provider. This is important. Contact a health care provider if:  Your lipoma becomes larger or hard.  Your lipoma becomes painful, red, or increasingly swollen. These could be signs of infection or a more serious condition. Get help right away if:  You develop tingling or numbness in an area near the lipoma. This could indicate that the lipoma is causing nerve damage. Summary  A lipoma is a noncancerous tumor that is made up of fat cells.  Most lipomas do not cause problems and do not require treatment.  If a lipoma is bigger or it causes problems, surgery may be done to remove the lipoma. This information is not intended to replace advice given to you by your health care provider. Make sure you discuss any questions you have with your health care provider. Document Released: 05/22/2002 Document Revised: 05/18/2017 Document Reviewed: 05/18/2017 Elsevier Patient Education  [place].

## 2019-05-25 ENCOUNTER — Encounter: Payer: Self-pay | Admitting: *Deleted

## 2019-05-29 ENCOUNTER — Other Ambulatory Visit: Payer: Self-pay

## 2019-05-29 ENCOUNTER — Other Ambulatory Visit: Payer: Self-pay | Admitting: Surgery

## 2019-05-29 DIAGNOSIS — D1722 Benign lipomatous neoplasm of skin and subcutaneous tissue of left arm: Secondary | ICD-10-CM

## 2019-05-30 ENCOUNTER — Ambulatory Visit: Payer: Self-pay | Admitting: Licensed Clinical Social Worker

## 2019-05-30 ENCOUNTER — Ambulatory Visit: Admission: RE | Admit: 2019-05-30 | Payer: Self-pay | Source: Ambulatory Visit

## 2019-05-30 ENCOUNTER — Telehealth: Payer: Self-pay | Admitting: Licensed Clinical Social Worker

## 2019-05-30 ENCOUNTER — Other Ambulatory Visit: Payer: Self-pay

## 2019-05-30 DIAGNOSIS — F331 Major depressive disorder, recurrent, moderate: Secondary | ICD-10-CM

## 2019-05-30 NOTE — Telephone Encounter (Signed)
Clinician attempted to contact the patient at least twice during their scheduled phone visit on December 15th @ 2 pm. Voicemail bos has not been set up yet.

## 2019-05-30 NOTE — BH Specialist Note (Signed)
Integrated Behavioral Health Follow Up Visit Via Phone  MRN: [id number] Name: Karla Carter  Type of Service: [place] Interpretor:No. Interpretor Name and Language:   SUBJECTIVE: Karla Carter is a 63 y.o. female accompanied by herself. Patient was referred by self for mental health. Patient reports the following symptoms/concerns: She describes feeling down and depressed the last couple of days but is not sure of the reason. She explains that she has been feeling down and depressed due to not being able to buy her family Christmas presents. She explains that feels bothered by one of her sisters who has to out do everyone every year when it comes to Christmas. She notes that she had a crying spell out of nowhere last night. She notes that celebrating Christmas has not been the same since her father passed away. She notes that she started taking Melatonin and has being able to sleep a little more than normal. She notes that she is continuing to crochet and that lifts her spirits. She denies suicidal and homicidal thoughts.  Duration of problem: ; Severity of problem: mild  OBJECTIVE: Mood: Depressed and Affect: Appropriate Risk of harm to self or others: No plan to harm self or others  LIFE CONTEXT: Family and Social: see above. School/Work: see above. Self-Care: see above. Life Changes: see above.  GOALS ADDRESSED: Patient will: 1.  Reduce symptoms of: depression  2.  Increase knowledge and/or ability of: self-management skills  3.  Demonstrate ability to: Increase healthy adjustment to current life circumstances  INTERVENTIONS: Interventions utilized:  Brief CBT was utilized by the clinician focused on the patient's depression. Clinician processed with the patient regarding how she has been doing since her last follow up session. Clinician explored with the patient possible reasons for the change in her mood. Clinician explained to the  patient that it sounds like she is missing her father and its the first year in awhile that she has not been working. Clinician encouraged the patient to continue to take the melatonin. Clinician encouraged the patient to continue to work on establishing a routine.  Standardized Assessments completed: PHQ 9  ASSESSMENT: Patient currently experiencing see above.   Patient may benefit from see above.  PLAN 1. Follow up with behavioral health clinician on : two weeks or earlier if needed.  2. Behavioral recommendations: see above. 3. Referral(s): [place] (In Clinic) 4. "From scale of 1-10, how likely are you to follow plan?":   Bayard Hugger, LCSW

## 2019-06-05 ENCOUNTER — Ambulatory Visit: Payer: Self-pay | Admitting: Gerontology

## 2019-06-12 ENCOUNTER — Other Ambulatory Visit: Payer: Self-pay | Admitting: Gerontology

## 2019-06-12 DIAGNOSIS — M25561 Pain in right knee: Secondary | ICD-10-CM

## 2019-06-12 DIAGNOSIS — E785 Hyperlipidemia, unspecified: Secondary | ICD-10-CM

## 2019-06-13 ENCOUNTER — Ambulatory Visit: Payer: Self-pay | Admitting: Surgery

## 2019-06-20 ENCOUNTER — Other Ambulatory Visit: Payer: Self-pay

## 2019-06-20 ENCOUNTER — Ambulatory Visit: Payer: Self-pay | Admitting: Licensed Clinical Social Worker

## 2019-06-20 DIAGNOSIS — F331 Major depressive disorder, recurrent, moderate: Secondary | ICD-10-CM

## 2019-06-20 NOTE — BH Specialist Note (Signed)
Integrated Behavioral Health Follow Up Visit Via Phone  MRN: [id number] Name: JALEA GASPAR  Type of Service: [place] Interpretor:No. Interpretor Name and Language: Not applicable.   SUBJECTIVE: Karla Carter is a 64 y.o. female accompanied by herself. Patient was referred by self for mental health. Patient reports the following symptoms/concerns: She explains that she felt a little down and depressed over the holidays due to missing her dad that passed away many years ago. She notes that it did feel good to make 12 scarfs prior to Christmas to give to her loved ones as Christmas presents. She notes that she is still crocheting to keep her hands and her mind busy. She reports that things at home have been okay and peaceful. She explains that her mood depends on the weather and situational stressors at home. She notes that she has been taking Melatonin and it helps her to sleep at night. She denies suicidal and homicidal thoughts.  Duration of problem: ; Severity of problem: mild  OBJECTIVE: Mood: Euthymic and Affect: Appropriate Risk of harm to self or others: No plan to harm self or others  LIFE CONTEXT: Family and Social: see above. School/Work: see above. Self-Care: see above. Life Changes: see above.  GOALS ADDRESSED: Patient will: 1.  Reduce symptoms of: depression  2.  Increase knowledge and/or ability of: coping skills and self-management skills  3.  Demonstrate ability to: Increase healthy adjustment to current life circumstances  INTERVENTIONS: Interventions utilized:  Supportive Counseling was utilized by the clinician during today's follow up session focusing on the patient's depression. Clinician processed with the patient regarding how she has been doing since the last follow up session. Clinician encouraged the patient to continue to take the Melatonin along with her psychotropic medications as prescribed. Clinician explained to  the patient that it sounds like she when she has something to do and focus on that it improves her overall mood. Clinician encouraged the patient to crochet and utilize her coping skills.  Standardized Assessments completed: PHQ 9  ASSESSMENT: Patient currently experiencing see above.   Patient may benefit from see above.  PLAN: 1. Follow up with behavioral health clinician on : two to three weeks or earlier if needed. 2. Behavioral recommendations: see above. 3. Referral(s): [place] (In Clinic) 4. "From scale of 1-10, how likely are you to follow plan?":   Bayard Hugger, LCSW

## 2019-06-21 ENCOUNTER — Telehealth: Payer: Self-pay | Admitting: Pharmacist

## 2019-06-21 ENCOUNTER — Other Ambulatory Visit: Payer: Self-pay

## 2019-06-21 ENCOUNTER — Ambulatory Visit: Payer: Self-pay | Admitting: Gerontology

## 2019-06-21 ENCOUNTER — Encounter: Payer: Self-pay | Admitting: Gerontology

## 2019-06-21 VITALS — BP 148/78 | HR 75 | Ht 64.0 in | Wt 169.0 lb

## 2019-06-21 DIAGNOSIS — E785 Hyperlipidemia, unspecified: Secondary | ICD-10-CM

## 2019-06-21 DIAGNOSIS — F17200 Nicotine dependence, unspecified, uncomplicated: Secondary | ICD-10-CM

## 2019-06-21 DIAGNOSIS — M25561 Pain in right knee: Secondary | ICD-10-CM

## 2019-06-21 DIAGNOSIS — R7303 Prediabetes: Secondary | ICD-10-CM

## 2019-06-21 DIAGNOSIS — L989 Disorder of the skin and subcutaneous tissue, unspecified: Secondary | ICD-10-CM

## 2019-06-21 DIAGNOSIS — I10 Essential (primary) hypertension: Secondary | ICD-10-CM

## 2019-06-21 MED ORDER — MELOXICAM 7.5 MG PO TABS: 7.5000 mg | ORAL_TABLET | Freq: Every day | ORAL | 2 refills | Status: DC

## 2019-06-21 MED ORDER — METFORMIN HCL 1000 MG PO TABS: 500.0000 mg | ORAL_TABLET | Freq: Every day | ORAL | 0 refills | Status: DC

## 2019-06-21 MED ORDER — AMLODIPINE BESYLATE 10 MG PO TABS: 10.0000 mg | ORAL_TABLET | Freq: Every day | ORAL | 4 refills | Status: DC

## 2019-06-21 MED ORDER — HYDROCORTISONE 1 % EX CREA: 1.0000 "application " | TOPICAL_CREAM | Freq: Two times a day (BID) | CUTANEOUS | 0 refills | Status: DC

## 2019-06-21 MED ORDER — ROSUVASTATIN CALCIUM 10 MG PO TABS: 10.0000 mg | ORAL_TABLET | Freq: Every day | ORAL | 3 refills | Status: DC

## 2019-06-21 NOTE — Patient Instructions (Signed)
Carbohydrate Counting for Diabetes Mellitus, Adult  Carbohydrate counting is a method of keeping track of how many carbohydrates you eat. Eating carbohydrates naturally increases the amount of sugar (glucose) in the blood. Counting how many carbohydrates you eat helps keep your blood glucose within normal limits, which helps you manage your diabetes (diabetes mellitus). It is important to know how many carbohydrates you can safely have in each meal. This is different for every person. A diet and nutrition specialist (registered dietitian) can help you make a meal plan and calculate how many carbohydrates you should have at each meal and snack. Carbohydrates are found in the following foods:  Grains, such as breads and cereals.  Dried beans and soy products.  Starchy vegetables, such as potatoes, peas, and corn.  Fruit and fruit juices.  Milk and yogurt.  Sweets and snack foods, such as cake, cookies, candy, chips, and soft drinks. How do I count carbohydrates? There are two ways to count carbohydrates in food. You can use either of the methods or a combination of both. Reading "Nutrition Facts" on packaged food The "Nutrition Facts" list is included on the labels of almost all packaged foods and beverages in the [place] It includes:  The serving size.  Information about nutrients in each serving, including the grams (g) of carbohydrate per serving. To use the "Nutrition Facts":  Decide how many servings you will have.  Multiply the number of servings by the number of carbohydrates per serving.  The resulting number is the total amount of carbohydrates that you will be having. Learning standard serving sizes of other foods When you eat carbohydrate foods that are not packaged or do not include "Nutrition Facts" on the label, you need to measure the servings in order to count the amount of carbohydrates:  Measure the foods that you will eat with a food scale or measuring cup, if  needed.  Decide how many standard-size servings you will eat.  Multiply the number of servings by 15. Most carbohydrate-rich foods have about 15 g of carbohydrates per serving. ? For example, if you eat 8 oz (170 g) of strawberries, you will have eaten 2 servings and 30 g of carbohydrates (2 servings x 15 g = 30 g).  For foods that have more than one food mixed, such as soups and casseroles, you must count the carbohydrates in each food that is included. The following list contains standard serving sizes of common carbohydrate-rich foods. Each of these servings has about 15 g of carbohydrates:   hamburger bun or  English muffin.   oz (15 mL) syrup.   oz (14 g) jelly.  1 slice of bread.  1 six-inch tortilla.  3 oz (85 g) cooked rice or pasta.  4 oz (113 g) cooked dried beans.  4 oz (113 g) starchy vegetable, such as peas, corn, or potatoes.  4 oz (113 g) hot cereal.  4 oz (113 g) mashed potatoes or  of a large baked potato.  4 oz (113 g) canned or frozen fruit.  4 oz (120 mL) fruit juice.  4-6 crackers.  6 chicken nuggets.  6 oz (170 g) unsweetened dry cereal.  6 oz (170 g) plain fat-free yogurt or yogurt sweetened with artificial sweeteners.  8 oz (240 mL) milk.  8 oz (170 g) fresh fruit or one small piece of fruit.  24 oz (680 g) popped popcorn. Example of carbohydrate counting Sample meal  3 oz (85 g) chicken breast.  6 oz (170 g)   brown rice.  4 oz (113 g) corn.  8 oz (240 mL) milk.  8 oz (170 g) strawberries with sugar-free whipped topping. Carbohydrate calculation 1. Identify the foods that contain carbohydrates: ? Rice. ? Corn. ? Milk. ? Strawberries. 2. Calculate how many servings you have of each food: ? 2 servings rice. ? 1 serving corn. ? 1 serving milk. ? 1 serving strawberries. 3. Multiply each number of servings by 15 g: ? 2 servings rice x 15 g = 30 g. ? 1 serving corn x 15 g = 15 g. ? 1 serving milk x 15 g = 15 g. ? 1  serving strawberries x 15 g = 15 g. 4. Add together all of the amounts to find the total grams of carbohydrates eaten: ? 30 g + 15 g + 15 g + 15 g = 75 g of carbohydrates total. Summary  Carbohydrate counting is a method of keeping track of how many carbohydrates you eat.  Eating carbohydrates naturally increases the amount of sugar (glucose) in the blood.  Counting how many carbohydrates you eat helps keep your blood glucose within normal limits, which helps you manage your diabetes.  A diet and nutrition specialist (registered dietitian) can help you make a meal plan and calculate how many carbohydrates you should have at each meal and snack. This information is not intended to replace advice given to you by your health care provider. Make sure you discuss any questions you have with your health care provider. Document Revised: 12/24/2016 Document Reviewed: 11/13/2015 Elsevier Patient Education  [place] and Cholesterol Restricted Eating Plan Getting too much fat and cholesterol in your diet may cause health problems. Choosing the right foods helps keep your fat and cholesterol at normal levels. This can keep you from getting certain diseases. Your doctor may recommend an eating plan that includes:  Total fat: ______% or less of total calories a day.  Saturated fat: ______% or less of total calories a day.  Cholesterol: less than _________mg a day.  Fiber: ______g a day. What are tips for following this plan? Meal planning  At meals, divide your plate into four equal parts: ? Fill one-half of your plate with vegetables and green salads. ? Fill one-fourth of your plate with whole grains. ? Fill one-fourth of your plate with low-fat (lean) protein foods.  Eat fish that is high in omega-3 fats at least two times a week. This includes mackerel, tuna, sardines, and salmon.  Eat foods that are high in fiber, such as whole grains, beans, apples, broccoli, carrots, peas,  and barley. General tips   Work with your doctor to lose weight if you need to.  Avoid: ? Foods with added sugar. ? Fried foods. ? Foods with partially hydrogenated oils.  Limit alcohol intake to no more than 1 drink a day for nonpregnant women and 2 drinks a day for men. One drink equals 12 oz of beer, 5 oz of wine, or 1 oz of hard liquor. Reading food labels  Check food labels for: ? Trans fats. ? Partially hydrogenated oils. ? Saturated fat (g) in each serving. ? Cholesterol (mg) in each serving. ? Fiber (g) in each serving.  Choose foods with healthy fats, such as: ? Monounsaturated fats. ? Polyunsaturated fats. ? Omega-3 fats.  Choose grain products that have whole grains. Look for the word "whole" as the first word in the ingredient list. Cooking  Cook foods using low-fat methods. These include baking, boiling, grilling, and  broiling.  Eat more home-cooked foods. Eat at restaurants and buffets less often.  Avoid cooking using saturated fats, such as butter, cream, palm oil, palm kernel oil, and coconut oil. Recommended foods  Fruits  All fresh, canned (in natural juice), or frozen fruits. Vegetables  Fresh or frozen vegetables (raw, steamed, roasted, or grilled). Green salads. Grains  Whole grains, such as whole wheat or whole grain breads, crackers, cereals, and pasta. Unsweetened oatmeal, bulgur, barley, quinoa, or brown rice. Corn or whole wheat flour tortillas. Meats and other protein foods  Ground beef (85% or leaner), grass-fed beef, or beef trimmed of fat. Skinless chicken or [place]. Ground chicken or [place]. Pork trimmed of fat. All fish and seafood. Egg whites. Dried beans, peas, or lentils. Unsalted nuts or seeds. Unsalted canned beans. Nut butters without added sugar or oil. Dairy  Low-fat or nonfat dairy products, such as skim or 1% milk, 2% or reduced-fat cheeses, low-fat and fat-free ricotta or cottage cheese, or plain low-fat and nonfat  yogurt. Fats and oils  Tub margarine without trans fats. Light or reduced-fat mayonnaise and salad dressings. Avocado. Olive, canola, sesame, or safflower oils. The items listed above may not be a complete list of foods and beverages you can eat. Contact a dietitian for more information. Foods to avoid Fruits  Canned fruit in heavy syrup. Fruit in cream or butter sauce. Fried fruit. Vegetables  Vegetables cooked in cheese, cream, or butter sauce. Fried vegetables. Grains  White bread. White pasta. White rice. Cornbread. Bagels, pastries, and croissants. Crackers and snack foods that contain trans fat and hydrogenated oils. Meats and other protein foods  Fatty cuts of meat. Ribs, chicken wings, bacon, sausage, bologna, salami, chitterlings, fatback, hot dogs, bratwurst, and packaged lunch meats. Liver and organ meats. Whole eggs and egg yolks. Chicken and [place] with skin. Fried meat. Dairy  Whole or 2% milk, cream, half-and-half, and cream cheese. Whole milk cheeses. Whole-fat or sweetened yogurt. Full-fat cheeses. Nondairy creamers and whipped toppings. Processed cheese, cheese spreads, and cheese curds. Beverages  Alcohol. Sugar-sweetened drinks such as sodas, lemonade, and fruit drinks. Fats and oils  Butter, stick margarine, lard, shortening, ghee, or bacon fat. Coconut, palm kernel, and palm oils. Sweets and desserts  Corn syrup, sugars, honey, and molasses. Candy. Jam and jelly. Syrup. Sweetened cereals. Cookies, pies, cakes, donuts, muffins, and ice cream. The items listed above may not be a complete list of foods and beverages you should avoid. Contact a dietitian for more information. Summary  Choosing the right foods helps keep your fat and cholesterol at normal levels. This can keep you from getting certain diseases.  At meals, fill one-half of your plate with vegetables and green salads.  Eat high-fiber foods, like whole grains, beans, apples, carrots, peas, and  barley.  Limit added sugar, saturated fats, alcohol, and fried foods. This information is not intended to replace advice given to you by your health care provider. Make sure you discuss any questions you have with your health care provider. Document Revised: 02/02/2018 Document Reviewed: 02/16/2017 Elsevier Patient Education  [place] DASH stands for "Dietary Approaches to Stop Hypertension." The DASH eating plan is a healthy eating plan that has been shown to reduce high blood pressure (hypertension). It may also reduce your risk for type 2 diabetes, heart disease, and stroke. The DASH eating plan may also help with weight loss. What are tips for following this plan?  General guidelines  Avoid eating more than 2,300 mg (  milligrams) of salt (sodium) a day. If you have hypertension, you may need to reduce your sodium intake to 1,500 mg a day.  Limit alcohol intake to no more than 1 drink a day for nonpregnant women and 2 drinks a day for men. One drink equals 12 oz of beer, 5 oz of wine, or 1 oz of hard liquor.  Work with your health care provider to maintain a healthy body weight or to lose weight. Ask what an ideal weight is for you.  Get at least 30 minutes of exercise that causes your heart to beat faster (aerobic exercise) most days of the week. Activities may include walking, swimming, or biking.  Work with your health care provider or diet and nutrition specialist (dietitian) to adjust your eating plan to your individual calorie needs. Reading food labels   Check food labels for the amount of sodium per serving. Choose foods with less than 5 percent of the Daily Value of sodium. Generally, foods with less than 300 mg of sodium per serving fit into this eating plan.  To find whole grains, look for the word "whole" as the first word in the ingredient list. Shopping  Buy products labeled as "low-sodium" or "no salt added."  Buy fresh foods. Avoid canned  foods and premade or frozen meals. Cooking  Avoid adding salt when cooking. Use salt-free seasonings or herbs instead of table salt or sea salt. Check with your health care provider or pharmacist before using salt substitutes.  Do not fry foods. Cook foods using healthy methods such as baking, boiling, grilling, and broiling instead.  Cook with heart-healthy oils, such as olive, canola, soybean, or sunflower oil. Meal planning  Eat a balanced diet that includes: ? 5 or more servings of fruits and vegetables each day. At each meal, try to fill half of your plate with fruits and vegetables. ? Up to 6-8 servings of whole grains each day. ? Less than 6 oz of lean meat, poultry, or fish each day. A 3-oz serving of meat is about the same size as a deck of cards. One egg equals 1 oz. ? 2 servings of low-fat dairy each day. ? A serving of nuts, seeds, or beans 5 times each week. ? Heart-healthy fats. Healthy fats called Omega-3 fatty acids are found in foods such as flaxseeds and coldwater fish, like sardines, salmon, and mackerel.  Limit how much you eat of the following: ? Canned or prepackaged foods. ? Food that is high in trans fat, such as fried foods. ? Food that is high in saturated fat, such as fatty meat. ? Sweets, desserts, sugary drinks, and other foods with added sugar. ? Full-fat dairy products.  Do not salt foods before eating.  Try to eat at least 2 vegetarian meals each week.  Eat more home-cooked food and less restaurant, buffet, and fast food.  When eating at a restaurant, ask that your food be prepared with less salt or no salt, if possible. What foods are recommended? The items listed may not be a complete list. Talk with your dietitian about what dietary choices are best for you. Grains Whole-grain or whole-wheat bread. Whole-grain or whole-wheat pasta. Brown rice. [place]. Bulgur. Whole-grain and low-sodium cereals. Pita bread. Low-fat, low-sodium crackers.  Whole-wheat flour tortillas. Vegetables Fresh or frozen vegetables (raw, steamed, roasted, or grilled). Low-sodium or reduced-sodium tomato and vegetable juice. Low-sodium or reduced-sodium tomato sauce and tomato paste. Low-sodium or reduced-sodium canned vegetables. Fruits All fresh, dried, or frozen fruit.  Canned fruit in natural juice (without added sugar). Meat and other protein foods Skinless chicken or [place]. Ground chicken or [place]. Pork with fat trimmed off. Fish and seafood. Egg whites. Dried beans, peas, or lentils. Unsalted nuts, nut butters, and seeds. Unsalted canned beans. Lean cuts of beef with fat trimmed off. Low-sodium, lean deli meat. Dairy Low-fat (1%) or fat-free (skim) milk. Fat-free, low-fat, or reduced-fat cheeses. Nonfat, low-sodium ricotta or cottage cheese. Low-fat or nonfat yogurt. Low-fat, low-sodium cheese. Fats and oils Soft margarine without trans fats. Vegetable oil. Low-fat, reduced-fat, or light mayonnaise and salad dressings (reduced-sodium). Canola, safflower, olive, soybean, and sunflower oils. Avocado. Seasoning and other foods Herbs. Spices. Seasoning mixes without salt. Unsalted popcorn and pretzels. Fat-free sweets. What foods are not recommended? The items listed may not be a complete list. Talk with your dietitian about what dietary choices are best for you. Grains Baked goods made with fat, such as croissants, muffins, or some breads. Dry pasta or rice meal packs. Vegetables Creamed or fried vegetables. Vegetables in a cheese sauce. Regular canned vegetables (not low-sodium or reduced-sodium). Regular canned tomato sauce and paste (not low-sodium or reduced-sodium). Regular tomato and vegetable juice (not low-sodium or reduced-sodium). Angie Fava. Olives. Fruits Canned fruit in a light or heavy syrup. Fried fruit. Fruit in cream or butter sauce. Meat and other protein foods Fatty cuts of meat. Ribs. Fried meat. [place] Salines. Sausage. Bologna and other  processed lunch meats. Salami. Fatback. Hotdogs. Bratwurst. Salted nuts and seeds. Canned beans with added salt. Canned or smoked fish. Whole eggs or egg yolks. Chicken or [place] with skin. Dairy Whole or 2% milk, cream, and half-and-half. Whole or full-fat cream cheese. Whole-fat or sweetened yogurt. Full-fat cheese. Nondairy creamers. Whipped toppings. Processed cheese and cheese spreads. Fats and oils Butter. Stick margarine. Lard. Shortening. Ghee. Bacon fat. Tropical oils, such as coconut, palm kernel, or palm oil. Seasoning and other foods Salted popcorn and pretzels. Onion salt, garlic salt, seasoned salt, table salt, and sea salt. Worcestershire sauce. Tartar sauce. Barbecue sauce. Teriyaki sauce. Soy sauce, including reduced-sodium. Steak sauce. Canned and packaged gravies. Fish sauce. Oyster sauce. Cocktail sauce. Horseradish that you find on the shelf. Ketchup. Mustard. Meat flavorings and tenderizers. Bouillon cubes. Hot sauce and Tabasco sauce. Premade or packaged marinades. Premade or packaged taco seasonings. Relishes. Regular salad dressings. Where to find more information:  National Heart, Lung, and [place]: https://wilson-eaton.com/  American Heart Association: www.heart.org Summary  The DASH eating plan is a healthy eating plan that has been shown to reduce high blood pressure (hypertension). It may also reduce your risk for type 2 diabetes, heart disease, and stroke.  With the DASH eating plan, you should limit salt (sodium) intake to 2,300 mg a day. If you have hypertension, you may need to reduce your sodium intake to 1,500 mg a day.  When on the DASH eating plan, aim to eat more fresh fruits and vegetables, whole grains, lean proteins, low-fat dairy, and heart-healthy fats.  Work with your health care provider or diet and nutrition specialist (dietitian) to adjust your eating plan to your individual calorie needs. This information is not intended to replace advice given to  you by your health care provider. Make sure you discuss any questions you have with your health care provider. Document Revised: 05/14/2017 Document Reviewed: 05/25/2016 Elsevier Patient Education  2020 [employer].
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
## 2019-06-21 NOTE — Telephone Encounter (Signed)
06/21/2019 9:34:51 AM - Ventolin renewal to patient to sign  06/21/2019 Patient's enrollment with [facility] expires 08/31/2019-Mailing patient her portion of [place] application to sign & return, will then fax to [place] for renewal.AJ  06/21/2019 9:33:46 AM - Ventolin script faxed for refill to [facility]  06/21/2019 I faxed script for Ventolin HFA to [facility] for refill.Delos Haring

## 2019-06-26 ENCOUNTER — Telehealth: Payer: Self-pay | Admitting: Pharmacist

## 2019-06-26 NOTE — Telephone Encounter (Signed)
06/26/2019 11:19:36 AM - Ruthe Mannan refill called into to [facility]  06/26/2019 called [facility] for refill on Dulera 100/5, allow 10-14 business days, according to invoice this is patient's last refill.Delos Haring

## 2019-07-04 ENCOUNTER — Other Ambulatory Visit: Payer: Self-pay

## 2019-07-04 ENCOUNTER — Ambulatory Visit: Payer: Self-pay | Admitting: Licensed Clinical Social Worker

## 2019-07-04 DIAGNOSIS — F331 Major depressive disorder, recurrent, moderate: Secondary | ICD-10-CM

## 2019-07-04 NOTE — BH Specialist Note (Signed)
Integrated Behavioral Health Follow Up Visit Via Phone  MRN: [id number] Name: Karla Carter  Type of Service: [place] Interpretor:No. Interpretor Name and Language: not applicable.   SUBJECTIVE: Karla Carter is a 64 y.o. female accompanied by herself.  Patient was referred by self for mental health.  Patient reports the following symptoms/concerns: She notes that she has not been feeling well for the past two to three days and thinks she has a cold. She explains that she has not had a lot of energy to do much in terms of cooking, laundry, and cleaning. She notes that due to not feeling well that she has been feeling moody and ill. She notes that her charity care for [facility] was approved so she can finally take care of her the rash on her legs. She denies suicidal and homicidal thoughts.  Duration of problem: ; Severity of problem: mild  OBJECTIVE: Mood: Euthymic and Affect: Appropriate Risk of harm to self or others: No plan to harm self or others  LIFE CONTEXT: Family and Social: see above.  School/Work: see above. Self-Care: see above. Life Changes: see above.  GOALS ADDRESSED: Patient will: 1.  Reduce symptoms of: agitation  2.  Increase knowledge and/or ability of: healthy habits and self-management skills  3.  Demonstrate ability to: Increase healthy adjustment to current life circumstances  INTERVENTIONS: Interventions utilized:  Supportive Counseling was utilized by the clinician during today's follow up session. Clinician processed with the patient regarding how she has been doing since the last follow up session. Clinician explained to the patient that if she is not feeling well and what she purchased over the counter is not helping that she may need to schedule a phone visit in clinic. Clinician explained to the patient that its important to practice self care and get adequate rest.  Standardized Assessments completed: PHQ  9  ASSESSMENT: Patient currently experiencing see above.   Patient may benefit from see above.  PLAN: 1. Follow up with behavioral health clinician on : two weeks or earlier if needed. 2. Behavioral recommendations: see above. 3. Referral(s): [facility] (In Clinic) 4. "From scale of 1-10, how likely are you to follow plan?":   Bayard Hugger, LCSW

## 2019-07-14 ENCOUNTER — Ambulatory Visit
Admission: RE | Admit: 2019-07-14 | Discharge: 2019-07-14 | Disposition: A | Discharge status: Home / Self Care | Payer: Self-pay | Source: Ambulatory Visit | Attending: Surgery | Admitting: Surgery

## 2019-07-14 ENCOUNTER — Other Ambulatory Visit: Payer: Self-pay

## 2019-07-14 ENCOUNTER — Encounter: Payer: Self-pay | Admitting: Surgery

## 2019-07-14 ENCOUNTER — Ambulatory Visit: Payer: Self-pay

## 2019-07-14 DIAGNOSIS — D1722 Benign lipomatous neoplasm of skin and subcutaneous tissue of left arm: Secondary | ICD-10-CM | POA: Insufficient documentation

## 2019-07-14 LAB — POCT I-STAT CREATININE: Creatinine, Ser: 0.8 mg/dL (ref 0.44–1.00)

## 2019-07-14 MED ORDER — IOHEXOL 300 MG/ML  SOLN
75.0000 mL | Freq: Once | INTRAMUSCULAR | Status: AC | PRN
  Administered 2019-07-14: 16:00:00 75 mL via INTRAVENOUS

## 2019-07-18 ENCOUNTER — Ambulatory Visit: Payer: Self-pay | Admitting: Licensed Clinical Social Worker

## 2019-07-18 ENCOUNTER — Other Ambulatory Visit: Payer: Self-pay

## 2019-07-18 DIAGNOSIS — F331 Major depressive disorder, recurrent, moderate: Secondary | ICD-10-CM

## 2019-07-18 DIAGNOSIS — Z8659 Personal history of other mental and behavioral disorders: Secondary | ICD-10-CM

## 2019-07-18 MED ORDER — SERTRALINE HCL 50 MG PO TABS: 150.0000 mg | ORAL_TABLET | Freq: Every day | ORAL | 2 refills | Status: DC

## 2019-07-18 MED ORDER — MIRTAZAPINE 15 MG PO TABS: 15.0000 mg | ORAL_TABLET | Freq: Every day | ORAL | 2 refills | Status: DC

## 2019-07-18 NOTE — BH Specialist Note (Signed)
Integrated Behavioral Health Follow Up Visit Via Phone  MRN: [id number] Name: Karla Carter  Type of Service: [place] Interpretor:No. Interpretor Name and Language: not applicable.   SUBJECTIVE: Karla Carter is a 64 y.o. female accompanied by herself. Patient was referred by self for mental health. Patient reports the following symptoms/concerns: She notes that she had a bad three days in terms of depression due to the anniversary of her father's death. She notes that she had her CT scan on the 29th and will see the doctor again on February 8th to discuss upcoming plans. She notes that she has had an improvement in her sleep and has been practicing sleep hygiene until she ran out of her medication. She reports that she finished all her scarves and is continuing to work on her [place]. She denies suicidal and homicidal thoughts.  Duration of problem: ; Severity of problem: mild  OBJECTIVE: Mood: Euthymic and Affect: Appropriate Risk of harm to self or others: No plan to harm self or others  LIFE CONTEXT: Family and Social: see above. School/Work: see above. Self-Care: see above. Life Changes: see above.  GOALS ADDRESSED: Patient will: 1.  Reduce symptoms of: depression  2.  Increase knowledge and/or ability of: coping skills and self-management skills  3.  Demonstrate ability to: Increase healthy adjustment to current life circumstances  INTERVENTIONS: Interventions utilized:  Supportive Counseling was utilized by the clinician during today's follow up session. Clinician processed with the patient regarding how she has been doing since the last follow up session. Clinician discussed with the patient regarding the cause of her most recent episode of depression. Clinician explained to the patient that it sounds like she is relieved to be another step closer to getting the lipoma of her left arm removed. Clinician explained to the patient that she  would have one month with two refills sent in of her Mirtazapine and Zoloft to medication management.  Standardized Assessments completed: GAD-7 and PHQ 9  ASSESSMENT: Patient currently experiencing see above.   Patient may benefit from see above.  PLAN: 1. Follow up with behavioral health clinician on : two weeks or earlier if needed. 2. Behavioral recommendations: see above.  3. Referral(s): [facility] (In Clinic) 4. "From scale of 1-10, how likely are you to follow plan?":   Bayard Hugger, LCSW

## 2019-07-19 ENCOUNTER — Ambulatory Visit: Payer: Self-pay | Admitting: Gerontology

## 2019-07-20 ENCOUNTER — Other Ambulatory Visit: Payer: Self-pay

## 2019-07-20 ENCOUNTER — Ambulatory Visit: Payer: Self-pay | Admitting: Gerontology

## 2019-07-20 VITALS — BP 144/90 | HR 74 | Temp 97.7°F | Wt 167.6 lb

## 2019-07-20 DIAGNOSIS — R7303 Prediabetes: Secondary | ICD-10-CM

## 2019-07-20 DIAGNOSIS — E785 Hyperlipidemia, unspecified: Secondary | ICD-10-CM

## 2019-07-20 DIAGNOSIS — L989 Disorder of the skin and subcutaneous tissue, unspecified: Secondary | ICD-10-CM

## 2019-07-20 DIAGNOSIS — R748 Abnormal levels of other serum enzymes: Secondary | ICD-10-CM

## 2019-07-20 DIAGNOSIS — F17200 Nicotine dependence, unspecified, uncomplicated: Secondary | ICD-10-CM

## 2019-07-20 DIAGNOSIS — I10 Essential (primary) hypertension: Secondary | ICD-10-CM

## 2019-07-20 DIAGNOSIS — D1722 Benign lipomatous neoplasm of skin and subcutaneous tissue of left arm: Secondary | ICD-10-CM

## 2019-07-20 NOTE — Patient Instructions (Signed)
Smoking Tobacco Information, Adult Smoking tobacco can be harmful to your health. Tobacco contains a poisonous (toxic), colorless chemical called nicotine. Nicotine is addictive. It changes the brain and can make it hard to stop smoking. Tobacco also has other toxic chemicals that can hurt your body and raise your risk of many cancers. How can smoking tobacco affect me? Smoking tobacco puts you at risk for:  Cancer. Smoking is most commonly associated with lung cancer, but can also lead to cancer in other parts of the body.  Chronic obstructive pulmonary disease (COPD). This is a long-term lung condition that makes it hard to breathe. It also gets worse over time.  High blood pressure (hypertension), heart disease, stroke, or heart attack.  Lung infections, such as pneumonia.  Cataracts. This is when the lenses in the eyes become clouded.  Digestive problems. This may include peptic ulcers, heartburn, and gastroesophageal reflux disease (GERD).  Oral health problems, such as gum disease and tooth loss.  Loss of taste and smell. Smoking can affect your appearance by causing:  Wrinkles.  Yellow or stained teeth, fingers, and fingernails. Smoking tobacco can also affect your social life, because:  It may be challenging to find places to smoke when away from home. Many workplaces, restaurants, hotels, and public places are tobacco-free.  Smoking is expensive. This is due to the cost of tobacco and the long-term costs of treating health problems from smoking.  Secondhand smoke may affect those around you. Secondhand smoke can cause lung cancer, breathing problems, and heart disease. Children of smokers have a higher risk for: ? Sudden infant death syndrome (SIDS). ? Ear infections. ? Lung infections. If you currently smoke tobacco, quitting now can help you:  Lead a longer and healthier life.  Look, smell, breathe, and feel better over time.  Save money.  Protect others from the  harms of secondhand smoke. What actions can I take to prevent health problems? Quit smoking   Do not start smoking. Quit if you already do.  Make a plan to quit smoking and commit to it. Look for programs to help you and ask your health care provider for recommendations and ideas.  Set a date and write down all the reasons you want to quit.  Let your friends and family know you are quitting so they can help and support you. Consider finding friends who also want to quit. It can be easier to quit with someone else, so that you can support each other.  Talk with your health care provider about using nicotine replacement medicines to help you quit, such as gum, lozenges, patches, sprays, or pills.  Do not replace cigarette smoking with electronic cigarettes, which are commonly called e-cigarettes. The safety of e-cigarettes is not known, and some may contain harmful chemicals.  If you try to quit but return to smoking, stay positive. It is common to slip up when you first quit, so take it one day at a time.  Be prepared for cravings. When you feel the urge to smoke, chew gum or suck on hard candy. Lifestyle  Stay busy and take care of your body.  Drink enough fluid to keep your urine pale yellow.  Get plenty of exercise and eat a healthy diet. This can help prevent weight gain after quitting.  Monitor your eating habits. Quitting smoking can cause you to have a larger appetite than when you smoke.  Find ways to relax. Go out with friends or family to a movie or a restaurant   where people do not smoke.  Ask your health care provider about having regular tests (screenings) to check for cancer. This may include blood tests, imaging tests, and other tests.  Find ways to manage your stress, such as meditation, yoga, or exercise. Where to find support To get support to quit smoking, consider:  Asking your health care provider for more information and resources.  Taking classes to learn  more about quitting smoking.  Looking for local organizations that offer resources about quitting smoking.  Joining a support group for people who want to quit smoking in your local community.  Calling the smokefree.gov counselor helpline: 1-800-Quit-Now (630)552-2623) Where to find more information You may find more information about quitting smoking from:  HelpGuide.org: www.helpguide.org  https://hall.com/: smokefree.gov  American Lung Association: www.lung.org Contact a health care provider if you:  Have problems breathing.  Notice that your lips, nose, or fingers turn blue.  Have chest pain.  Are coughing up blood.  Feel faint or you pass out.  Have other health changes that cause you to worry. Summary  Smoking tobacco can negatively affect your health, the health of those around you, your finances, and your social life.  Do not start smoking. Quit if you already do. If you need help quitting, ask your health care provider.  Think about joining a support group for people who want to quit smoking in your local community. There are many effective programs that will help you to quit this behavior. This information is not intended to replace advice given to you by your health care provider. Make sure you discuss any questions you have with your health care provider. Document Revised: 02/24/2019 Document Reviewed: 06/16/2016 Elsevier Patient Education  [place] and Cholesterol Restricted Eating Plan Getting too much fat and cholesterol in your diet may cause health problems. Choosing the right foods helps keep your fat and cholesterol at normal levels. This can keep you from getting certain diseases. Your doctor may recommend an eating plan that includes:  Total fat: ______% or less of total calories a day.  Saturated fat: ______% or less of total calories a day.  Cholesterol: less than _________mg a day.  Fiber: ______g a day. What are tips for following  this plan? Meal planning  At meals, divide your plate into four equal parts: ? Fill one-half of your plate with vegetables and green salads. ? Fill one-fourth of your plate with whole grains. ? Fill one-fourth of your plate with low-fat (lean) protein foods.  Eat fish that is high in omega-3 fats at least two times a week. This includes mackerel, tuna, sardines, and salmon.  Eat foods that are high in fiber, such as whole grains, beans, apples, broccoli, carrots, peas, and barley. General tips   Work with your doctor to lose weight if you need to.  Avoid: ? Foods with added sugar. ? Fried foods. ? Foods with partially hydrogenated oils.  Limit alcohol intake to no more than 1 drink a day for nonpregnant women and 2 drinks a day for men. One drink equals 12 oz of beer, 5 oz of wine, or 1 oz of hard liquor. Reading food labels  Check food labels for: ? Trans fats. ? Partially hydrogenated oils. ? Saturated fat (g) in each serving. ? Cholesterol (mg) in each serving. ? Fiber (g) in each serving.  Choose foods with healthy fats, such as: ? Monounsaturated fats. ? Polyunsaturated fats. ? Omega-3 fats.  Choose grain products that have whole grains.  Look for the word "whole" as the first word in the ingredient list. Cooking  Cook foods using low-fat methods. These include baking, boiling, grilling, and broiling.  Eat more home-cooked foods. Eat at restaurants and buffets less often.  Avoid cooking using saturated fats, such as butter, cream, palm oil, palm kernel oil, and coconut oil. Recommended foods  Fruits  All fresh, canned (in natural juice), or frozen fruits. Vegetables  Fresh or frozen vegetables (raw, steamed, roasted, or grilled). Green salads. Grains  Whole grains, such as whole wheat or whole grain breads, crackers, cereals, and pasta. Unsweetened oatmeal, bulgur, barley, quinoa, or brown rice. Corn or whole wheat flour tortillas. Meats and other protein  foods  Ground beef (85% or leaner), grass-fed beef, or beef trimmed of fat. Skinless chicken or [place]. Ground chicken or [place]. Pork trimmed of fat. All fish and seafood. Egg whites. Dried beans, peas, or lentils. Unsalted nuts or seeds. Unsalted canned beans. Nut butters without added sugar or oil. Dairy  Low-fat or nonfat dairy products, such as skim or 1% milk, 2% or reduced-fat cheeses, low-fat and fat-free ricotta or cottage cheese, or plain low-fat and nonfat yogurt. Fats and oils  Tub margarine without trans fats. Light or reduced-fat mayonnaise and salad dressings. Avocado. Olive, canola, sesame, or safflower oils. The items listed above may not be a complete list of foods and beverages you can eat. Contact a dietitian for more information. Foods to avoid Fruits  Canned fruit in heavy syrup. Fruit in cream or butter sauce. Fried fruit. Vegetables  Vegetables cooked in cheese, cream, or butter sauce. Fried vegetables. Grains  White bread. White pasta. White rice. Cornbread. Bagels, pastries, and croissants. Crackers and snack foods that contain trans fat and hydrogenated oils. Meats and other protein foods  Fatty cuts of meat. Ribs, chicken wings, bacon, sausage, bologna, salami, chitterlings, fatback, hot dogs, bratwurst, and packaged lunch meats. Liver and organ meats. Whole eggs and egg yolks. Chicken and [place] with skin. Fried meat. Dairy  Whole or 2% milk, cream, half-and-half, and cream cheese. Whole milk cheeses. Whole-fat or sweetened yogurt. Full-fat cheeses. Nondairy creamers and whipped toppings. Processed cheese, cheese spreads, and cheese curds. Beverages  Alcohol. Sugar-sweetened drinks such as sodas, lemonade, and fruit drinks. Fats and oils  Butter, stick margarine, lard, shortening, ghee, or bacon fat. Coconut, palm kernel, and palm oils. Sweets and desserts  Corn syrup, sugars, honey, and molasses. Candy. Jam and jelly. Syrup. Sweetened cereals. Cookies,  pies, cakes, donuts, muffins, and ice cream. The items listed above may not be a complete list of foods and beverages you should avoid. Contact a dietitian for more information. Summary  Choosing the right foods helps keep your fat and cholesterol at normal levels. This can keep you from getting certain diseases.  At meals, fill one-half of your plate with vegetables and green salads.  Eat high-fiber foods, like whole grains, beans, apples, carrots, peas, and barley.  Limit added sugar, saturated fats, alcohol, and fried foods. This information is not intended to replace advice given to you by your health care provider. Make sure you discuss any questions you have with your health care provider. Document Revised: 02/02/2018 Document Reviewed: 02/16/2017 Elsevier Patient Education  2020 [place] for Diabetes Mellitus, Adult  Carbohydrate counting is a method of keeping track of how many carbohydrates you eat. Eating carbohydrates naturally increases the amount of sugar (glucose) in the blood. Counting how many carbohydrates you eat helps keep your blood glucose within  normal limits, which helps you manage your diabetes (diabetes mellitus). It is important to know how many carbohydrates you can safely have in each meal. This is different for every person. A diet and nutrition specialist (registered dietitian) can help you make a meal plan and calculate how many carbohydrates you should have at each meal and snack. Carbohydrates are found in the following foods:  Grains, such as breads and cereals.  Dried beans and soy products.  Starchy vegetables, such as potatoes, peas, and corn.  Fruit and fruit juices.  Milk and yogurt.  Sweets and snack foods, such as cake, cookies, candy, chips, and soft drinks. How do I count carbohydrates? There are two ways to count carbohydrates in food. You can use either of the methods or a combination of both. Reading "Nutrition  Facts" on packaged food The "Nutrition Facts" list is included on the labels of almost all packaged foods and beverages in the [place] It includes:  The serving size.  Information about nutrients in each serving, including the grams (g) of carbohydrate per serving. To use the "Nutrition Facts":  Decide how many servings you will have.  Multiply the number of servings by the number of carbohydrates per serving.  The resulting number is the total amount of carbohydrates that you will be having. Learning standard serving sizes of other foods When you eat carbohydrate foods that are not packaged or do not include "Nutrition Facts" on the label, you need to measure the servings in order to count the amount of carbohydrates:  Measure the foods that you will eat with a food scale or measuring cup, if needed.  Decide how many standard-size servings you will eat.  Multiply the number of servings by 15. Most carbohydrate-rich foods have about 15 g of carbohydrates per serving. ? For example, if you eat 8 oz (170 g) of strawberries, you will have eaten 2 servings and 30 g of carbohydrates (2 servings x 15 g = 30 g).  For foods that have more than one food mixed, such as soups and casseroles, you must count the carbohydrates in each food that is included. The following list contains standard serving sizes of common carbohydrate-rich foods. Each of these servings has about 15 g of carbohydrates:   hamburger bun or  English muffin.   oz (15 mL) syrup.   oz (14 g) jelly.  1 slice of bread.  1 six-inch tortilla.  3 oz (85 g) cooked rice or pasta.  4 oz (113 g) cooked dried beans.  4 oz (113 g) starchy vegetable, such as peas, corn, or potatoes.  4 oz (113 g) hot cereal.  4 oz (113 g) mashed potatoes or  of a large baked potato.  4 oz (113 g) canned or frozen fruit.  4 oz (120 mL) fruit juice.  4-6 crackers.  6 chicken nuggets.  6 oz (170 g) unsweetened dry cereal.  6 oz (170  g) plain fat-free yogurt or yogurt sweetened with artificial sweeteners.  8 oz (240 mL) milk.  8 oz (170 g) fresh fruit or one small piece of fruit.  24 oz (680 g) popped popcorn. Example of carbohydrate counting Sample meal  3 oz (85 g) chicken breast.  6 oz (170 g) brown rice.  4 oz (113 g) corn.  8 oz (240 mL) milk.  8 oz (170 g) strawberries with sugar-free whipped topping. Carbohydrate calculation 1. Identify the foods that contain carbohydrates: ? Rice. ? Corn. ? Milk. ? Strawberries. 2. Calculate how  many servings you have of each food: ? 2 servings rice. ? 1 serving corn. ? 1 serving milk. ? 1 serving strawberries. 3. Multiply each number of servings by 15 g: ? 2 servings rice x 15 g = 30 g. ? 1 serving corn x 15 g = 15 g. ? 1 serving milk x 15 g = 15 g. ? 1 serving strawberries x 15 g = 15 g. 4. Add together all of the amounts to find the total grams of carbohydrates eaten: ? 30 g + 15 g + 15 g + 15 g = 75 g of carbohydrates total. Summary  Carbohydrate counting is a method of keeping track of how many carbohydrates you eat.  Eating carbohydrates naturally increases the amount of sugar (glucose) in the blood.  Counting how many carbohydrates you eat helps keep your blood glucose within normal limits, which helps you manage your diabetes.  A diet and nutrition specialist (registered dietitian) can help you make a meal plan and calculate how many carbohydrates you should have at each meal and snack. This information is not intended to replace advice given to you by your health care provider. Make sure you discuss any questions you have with your health care provider. Document Revised: 12/24/2016 Document Reviewed: 11/13/2015 Elsevier Patient Education  [place] DASH stands for "Dietary Approaches to Stop Hypertension." The DASH eating plan is a healthy eating plan that has been shown to reduce high blood pressure (hypertension). It  may also reduce your risk for type 2 diabetes, heart disease, and stroke. The DASH eating plan may also help with weight loss. What are tips for following this plan?  General guidelines  Avoid eating more than 2,300 mg (milligrams) of salt (sodium) a day. If you have hypertension, you may need to reduce your sodium intake to 1,500 mg a day.  Limit alcohol intake to no more than 1 drink a day for nonpregnant women and 2 drinks a day for men. One drink equals 12 oz of beer, 5 oz of wine, or 1 oz of hard liquor.  Work with your health care provider to maintain a healthy body weight or to lose weight. Ask what an ideal weight is for you.  Get at least 30 minutes of exercise that causes your heart to beat faster (aerobic exercise) most days of the week. Activities may include walking, swimming, or biking.  Work with your health care provider or diet and nutrition specialist (dietitian) to adjust your eating plan to your individual calorie needs. Reading food labels   Check food labels for the amount of sodium per serving. Choose foods with less than 5 percent of the Daily Value of sodium. Generally, foods with less than 300 mg of sodium per serving fit into this eating plan.  To find whole grains, look for the word "whole" as the first word in the ingredient list. Shopping  Buy products labeled as "low-sodium" or "no salt added."  Buy fresh foods. Avoid canned foods and premade or frozen meals. Cooking  Avoid adding salt when cooking. Use salt-free seasonings or herbs instead of table salt or sea salt. Check with your health care provider or pharmacist before using salt substitutes.  Do not fry foods. Cook foods using healthy methods such as baking, boiling, grilling, and broiling instead.  Cook with heart-healthy oils, such as olive, canola, soybean, or sunflower oil. Meal planning  Eat a balanced diet that includes: ? 5 or more servings of fruits and  vegetables each day. At each  meal, try to fill half of your plate with fruits and vegetables. ? Up to 6-8 servings of whole grains each day. ? Less than 6 oz of lean meat, poultry, or fish each day. A 3-oz serving of meat is about the same size as a deck of cards. One egg equals 1 oz. ? 2 servings of low-fat dairy each day. ? A serving of nuts, seeds, or beans 5 times each week. ? Heart-healthy fats. Healthy fats called Omega-3 fatty acids are found in foods such as flaxseeds and coldwater fish, like sardines, salmon, and mackerel.  Limit how much you eat of the following: ? Canned or prepackaged foods. ? Food that is high in trans fat, such as fried foods. ? Food that is high in saturated fat, such as fatty meat. ? Sweets, desserts, sugary drinks, and other foods with added sugar. ? Full-fat dairy products.  Do not salt foods before eating.  Try to eat at least 2 vegetarian meals each week.  Eat more home-cooked food and less restaurant, buffet, and fast food.  When eating at a restaurant, ask that your food be prepared with less salt or no salt, if possible. What foods are recommended? The items listed may not be a complete list. Talk with your dietitian about what dietary choices are best for you. Grains Whole-grain or whole-wheat bread. Whole-grain or whole-wheat pasta. Brown rice. [place]. Bulgur. Whole-grain and low-sodium cereals. Pita bread. Low-fat, low-sodium crackers. Whole-wheat flour tortillas. Vegetables Fresh or frozen vegetables (raw, steamed, roasted, or grilled). Low-sodium or reduced-sodium tomato and vegetable juice. Low-sodium or reduced-sodium tomato sauce and tomato paste. Low-sodium or reduced-sodium canned vegetables. Fruits All fresh, dried, or frozen fruit. Canned fruit in natural juice (without added sugar). Meat and other protein foods Skinless chicken or [place]. Ground chicken or [place]. Pork with fat trimmed off. Fish and seafood. Egg whites. Dried beans, peas, or lentils.  Unsalted nuts, nut butters, and seeds. Unsalted canned beans. Lean cuts of beef with fat trimmed off. Low-sodium, lean deli meat. Dairy Low-fat (1%) or fat-free (skim) milk. Fat-free, low-fat, or reduced-fat cheeses. Nonfat, low-sodium ricotta or cottage cheese. Low-fat or nonfat yogurt. Low-fat, low-sodium cheese. Fats and oils Soft margarine without trans fats. Vegetable oil. Low-fat, reduced-fat, or light mayonnaise and salad dressings (reduced-sodium). Canola, safflower, olive, soybean, and sunflower oils. Avocado. Seasoning and other foods Herbs. Spices. Seasoning mixes without salt. Unsalted popcorn and pretzels. Fat-free sweets. What foods are not recommended? The items listed may not be a complete list. Talk with your dietitian about what dietary choices are best for you. Grains Baked goods made with fat, such as croissants, muffins, or some breads. Dry pasta or rice meal packs. Vegetables Creamed or fried vegetables. Vegetables in a cheese sauce. Regular canned vegetables (not low-sodium or reduced-sodium). Regular canned tomato sauce and paste (not low-sodium or reduced-sodium). Regular tomato and vegetable juice (not low-sodium or reduced-sodium). Angie Fava. Olives. Fruits Canned fruit in a light or heavy syrup. Fried fruit. Fruit in cream or butter sauce. Meat and other protein foods Fatty cuts of meat. Ribs. Fried meat. Berniece Salines. Sausage. Bologna and other processed lunch meats. Salami. Fatback. Hotdogs. Bratwurst. Salted nuts and seeds. Canned beans with added salt. Canned or smoked fish. Whole eggs or egg yolks. Chicken or [place] with skin. Dairy Whole or 2% milk, cream, and half-and-half. Whole or full-fat cream cheese. Whole-fat or sweetened yogurt. Full-fat cheese. Nondairy creamers. Whipped toppings. Processed cheese and cheese spreads. Fats and oils  Butter. Stick margarine. Lard. Shortening. Ghee. Bacon fat. Tropical oils, such as coconut, palm kernel, or palm oil. Seasoning and  other foods Salted popcorn and pretzels. Onion salt, garlic salt, seasoned salt, table salt, and sea salt. Worcestershire sauce. Tartar sauce. Barbecue sauce. Teriyaki sauce. Soy sauce, including reduced-sodium. Steak sauce. Canned and packaged gravies. Fish sauce. Oyster sauce. Cocktail sauce. Horseradish that you find on the shelf. Ketchup. Mustard. Meat flavorings and tenderizers. Bouillon cubes. Hot sauce and Tabasco sauce. Premade or packaged marinades. Premade or packaged taco seasonings. Relishes. Regular salad dressings. Where to find more information:  National Heart, Lung, and [place]: https://wilson-eaton.com/  American Heart Association: www.heart.org Summary  The DASH eating plan is a healthy eating plan that has been shown to reduce high blood pressure (hypertension). It may also reduce your risk for type 2 diabetes, heart disease, and stroke.  With the DASH eating plan, you should limit salt (sodium) intake to 2,300 mg a day. If you have hypertension, you may need to reduce your sodium intake to 1,500 mg a day.  When on the DASH eating plan, aim to eat more fresh fruits and vegetables, whole grains, lean proteins, low-fat dairy, and heart-healthy fats.  Work with your health care provider or diet and nutrition specialist (dietitian) to adjust your eating plan to your individual calorie needs. This information is not intended to replace advice given to you by your health care provider. Make sure you discuss any questions you have with your health care provider. Document Revised: 05/14/2017 Document Reviewed: 05/25/2016 Elsevier Patient Education  2020 [employer].

## 2019-07-20 NOTE — Progress Notes (Signed)
 Established Patient Office Visit  Subjective:  Patient ID: Karla Carter, female    DOB: 10/26/1955  Age: 63 y.o. MRN: [id number]  CC:  Chief Complaint  Patient presents with  . 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 Her blood pressure during visit was 144/90 and Karla Carter states that Karla Carter forgot to take her blood pressure medication prior to visit. Karla Carter continues to smoke 1/2 pack of cigarette daily and admits the desire to quit. Karla Carter states that Karla Carter checks her blood pressure as needed and adheres to DASH diet. Her HgbA1c done 3 months ago was 6.1%, and Karla Carter states that Karla Carter was always nauseous after taking 500 mg Metformin and Karla Carter stopped. Karla Carter states that Karla Carter tolerates 10 mg Rosuvastatin and denies myalgia. Karla Carter will follow up with Dermatology on 08/15/2019 for the dry erythematous lesion to her left lower leg and General Surgery Dr Piscoya on 08/01/2019 for her left upper arm Lipoma. Karla Carter denies chest pain, palpitation, light headedness, fever and chills. Overall, Karla Carter states that Karla Carter's doing well and offers no further complaint.  Past Medical History:  Diagnosis Date  . Anxiety   . COPD (chronic obstructive pulmonary disease) (HCC)   . Depression   . GERD (gastroesophageal reflux disease)   . Hypertension     Past Surgical History:  Procedure Laterality Date  . ABDOMINAL HYSTERECTOMY    . APPENDECTOMY    . COLON SURGERY    . TONSILLECTOMY      Family History  Problem Relation Age of Onset  . Cancer Father   . Cancer Maternal Aunt     Social History   Socioeconomic History  . Marital status: Married    Spouse name: Not on file  . Number of children: Not on file  . Years of education: Not on file  . Highest education level: Not on file  Occupational History  . Not on file  Tobacco Use  . Smoking status: Current Every Day Smoker    Packs/day: 0.75    Years: 48.00    Pack years:  36.00    Types: Cigarettes  . Smokeless tobacco: Never Used  Substance and Sexual Activity  . Alcohol use: No  . Drug use: Never  . Sexual activity: Not on file  Other Topics Concern  . Not on file  Social History Narrative  . Not on file   Social Determinants of Health   Financial Resource Strain:   . Difficulty of Paying Living Expenses: Not on file  Food Insecurity:   . Worried About Running Out of Food in the Last Year: Not on file  . Ran Out of Food in the Last Year: Not on file  Transportation Needs:   . Lack of Transportation (Medical): Not on file  . Lack of Transportation (Non-Medical): Not on file  Physical Activity: Insufficiently Active  . Days of Exercise per Week: 7 days  . Minutes of Exercise per Session: 20 min  Stress:   . Feeling of Stress : Not on file  Social Connections:   . Frequency of Communication with Friends and Family: Not on file  . Frequency of Social Gatherings with Friends and Family: Not on file  . Attends Religious Services: Not on file  . Active Member of Clubs or Organizations: Not on file  . Attends Club or Organization Meetings: Not on file  . Marital Status: Not on file    Intimate Partner Violence:   . Fear of Current or Ex-Partner: Not on file  . Emotionally Abused: Not on file  . Physically Abused: Not on file  . Sexually Abused: Not on file    Outpatient Medications Prior to Visit  Medication Sig Dispense Refill  . albuterol (PROVENTIL HFA;VENTOLIN HFA) 108 (90 Base) MCG/ACT inhaler INHALE 2 PUFFS INTO THE LUNGS EVERY 4 HOURS AS NEEDED FOR WHEEZING ORSHORTNESS OF BREATH 18 g 0  . amLODipine (NORVASC) 10 MG tablet Take 1 tablet (10 mg total) by mouth daily. 30 tablet 4  . Blood Pressure Monitor KIT 1 kit by Does not apply route daily. 1 kit 0  . hydrocortisone cream 1 % Apply 1 application topically 2 (two) times daily. 30 g 0  . MELATONIN PO Take 30 mg by mouth.    . meloxicam (MOBIC) 7.5 MG tablet Take 1 tablet (7.5 mg total)  by mouth daily. 30 tablet 2  . mirtazapine (REMERON) 15 MG tablet Take 1 tablet (15 mg total) by mouth at bedtime. 30 tablet 2  . mometasone-formoterol (DULERA) 100-5 MCG/ACT AERO Inhale 2 puffs into the lungs 2 (two) times daily. (Patient taking differently: Inhale 2 puffs into the lungs daily. ) 1 Inhaler 3  . rosuvastatin (CRESTOR) 10 MG tablet Take 1 tablet (10 mg total) by mouth daily. 30 tablet 3  . sertraline (ZOLOFT) 50 MG tablet Take 3 tablets (150 mg total) by mouth daily. 90 tablet 2  . metFORMIN (GLUCOPHAGE) 1000 MG tablet Take 0.5 tablets (500 mg total) by mouth daily with breakfast. 30 tablet 0   No facility-administered medications prior to visit.    Allergies  Allergen Reactions  . Codeine Rash    ROS Review of Systems  Constitutional: Negative.   Respiratory: Negative.   Cardiovascular: Negative.   Skin: Negative.        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  Neurological: Negative.   Psychiatric/Behavioral: Negative.       Objective:    Physical Exam  Constitutional: Karla Carter is oriented to person, place, and time. Karla Carter appears well-developed.  HENT:  Head: Normocephalic and atraumatic.  Eyes: Pupils are equal, round, and reactive to light.  Cardiovascular: Normal rate and regular rhythm.  Pulmonary/Chest: Effort normal and breath sounds normal.  Neurological: Karla Carter is alert and oriented to person, place, and time.  Skin:  Dry erythematous lesion to left lower leg. 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  Psychiatric: Karla Carter has a normal mood and affect. Her behavior is normal. Judgment and thought content normal.    BP (!) 144/90 (BP Location: Right Arm, Patient Position: Sitting)   Pulse 74   Temp 97.7 F (36.5 C)   Wt 167 lb 9.6 oz (76 kg)   SpO2 96%   BMI 28.77 kg/m  Wt Readings from Last 3 Encounters:  07/20/19 167 lb 9.6 oz (76 kg)  06/21/19 169 lb (76.7 kg)  05/24/19 165 lb 9.6 oz (75.1 kg)     Health Maintenance Due   Topic Date Due  . HIV Screening  01/08/1971  . TETANUS/TDAP  01/08/1975  . PAP SMEAR-Modifier  01/07/1977  . MAMMOGRAM  01/07/2006  . COLONOSCOPY  01/07/2006    There are no preventive care reminders to display for this patient.  Lab Results  Component Value Date   TSH 1.640 04/06/2019   Lab Results  Component Value Date   WBC 12.9 (H) 04/06/2019   HGB 13.8 04/06/2019  HCT 39.8 04/06/2019   MCV 85 04/06/2019   PLT 237 04/06/2019   Lab Results  Component Value Date   NA 138 04/06/2019   K 4.5 04/06/2019   CO2 20 04/06/2019   GLUCOSE 153 (H) 04/06/2019   BUN 10 04/06/2019   CREATININE 0.80 07/14/2019   BILITOT 0.3 04/06/2019   ALKPHOS 116 04/06/2019   AST 61 (H) 04/06/2019   ALT 80 (H) 04/06/2019   PROT 7.1 04/06/2019   ALBUMIN 4.4 04/06/2019   CALCIUM 9.5 04/06/2019   ANIONGAP 10 08/03/2016   Lab Results  Component Value Date   CHOL 270 (H) 04/06/2019   Lab Results  Component Value Date   HDL 48 04/06/2019   Lab Results  Component Value Date   LDLCALC 164 (H) 04/06/2019   Lab Results  Component Value Date   TRIG 306 (H) 04/06/2019   Lab Results  Component Value Date   CHOLHDL 5.6 (H) 04/06/2019   Lab Results  Component Value Date   HGBA1C 6.1 (H) 04/06/2019      Assessment & Plan:   1. Essential hypertension - Her blood pressure is under control and Karla Carter will continue on current treatment regimen. Her goal blood pressure is 150/90 and normal is less than 120/80. Karla Carter was advised to continue on DASH diet and exercise as tolerated.  2. Skin lesion of left lower extremity - Karla Carter was advised to follow up with Dermatology  3. Lipoma of left upper extremity - Karla Carter was advised to follow up with General Surgery on 08/01/2019 with Dr Piscoya.  4. Prediabetes - Her HgbA1c was 6.1%, Metformin was discontinued and was advised to continue on low carb/non concentrated sweet diet. - Will recheck HgB A1c; Future  5. Smoking - He was encouraged on smoking  cessation and [place] Quitline information was provided. 6. Elevated liver enzymes  - Will recheck Comp Met (CMET); Future  7. Elevated lipids - Karla Carter will continue on 10 mg Rosuvastatin, was encouraged to continue on -Low fat Diet, like low fat dairy products eg skimmed milk -Avoid any fried food -Regular exercise/walk -Goal for Total Cholesterol is less than 200 -Goal for bad cholesterol LDL is less than 100 -Goal for Good cholesterol HDL is more than 45 -Goal for Triglyceride is less than 150 - Will recheck Lipid Profile; Future     Follow-up: Return in about 9 weeks (around 09/21/2019), or if symptoms worsen or fail to improve.    Chioma E Iloabachie, NP 

## 2019-07-21 ENCOUNTER — Ambulatory Visit: Payer: Self-pay | Admitting: Surgery

## 2019-07-24 ENCOUNTER — Encounter: Payer: Self-pay | Admitting: Surgery

## 2019-07-24 ENCOUNTER — Telehealth: Payer: Self-pay

## 2019-07-24 ENCOUNTER — Other Ambulatory Visit: Payer: Self-pay

## 2019-07-24 ENCOUNTER — Ambulatory Visit (INDEPENDENT_AMBULATORY_CARE_PROVIDER_SITE_OTHER): Payer: Self-pay | Admitting: Surgery

## 2019-07-24 VITALS — BP 174/82 | HR 86 | Temp 97.5°F | Ht 64.0 in | Wt 165.8 lb

## 2019-07-24 DIAGNOSIS — D1722 Benign lipomatous neoplasm of skin and subcutaneous tissue of left arm: Secondary | ICD-10-CM

## 2019-07-24 NOTE — Telephone Encounter (Signed)
Medical Clearance was faxed over to Karla Carter at the [facility].   Fax Number : [phone]

## 2019-07-24 NOTE — Patient Instructions (Addendum)
Our surgery scheduler Marzetta Board will contact you within the next 24-48 hours. Marzetta Board will discuss the surgery dates, times and discuss the preparation for surgery. Please have the BLUE sheet available when she contacts you. Please give our office a call if you have any questions or concerns.   Lipoma  A lipoma is a noncancerous (benign) tumor that is made up of fat cells. This is a very common type of soft-tissue growth. Lipomas are usually found under the skin (subcutaneous). They may occur in any tissue of the body that contains fat. Common areas for lipomas to appear include the back, arms, shoulders, buttocks, and thighs. Lipomas grow slowly, and they are usually painless. Most lipomas do not cause problems and do not require treatment. What are the causes? The cause of this condition is not known. What increases the risk? You are more likely to develop this condition if:  You are 48-23 years old.  You have a family history of lipomas. What are the signs or symptoms? A lipoma usually appears as a small, round bump under the skin. In most cases, the lump will:  Feel soft or rubbery.  Not cause pain or other symptoms. However, if a lipoma is located in an area where it pushes on nerves, it can become painful or cause other symptoms. How is this diagnosed? A lipoma can usually be diagnosed with a physical exam. You may also have tests to confirm the diagnosis and to rule out other conditions. Tests may include:  Imaging tests, such as a CT scan or an MRI.  Removal of a tissue sample to be looked at under a microscope (biopsy). How is this treated? Treatment for this condition depends on the size of the lipoma and whether it is causing any symptoms.  For small lipomas that are not causing problems, no treatment is needed.  If a lipoma is bigger or it causes problems, surgery may be done to remove the lipoma. Lipomas can also be removed to improve appearance. Most often, the procedure is  done after applying a medicine that numbs the area (local anesthetic).  Liposuction may be done to reduce the size of the lipoma before it is removed through surgery, or it may be done to remove the lipoma. Lipomas are removed with this method in order to limit incision size and scarring. A liposuction tube is inserted through a small incision into the lipoma, and the contents of the lipoma are removed through the tube with suction. Follow these instructions at home:  Watch your lipoma for any changes.  Keep all follow-up visits as told by your health care provider. This is important. Contact a health care provider if:  Your lipoma becomes larger or hard.  Your lipoma becomes painful, red, or increasingly swollen. These could be signs of infection or a more serious condition. Get help right away if:  You develop tingling or numbness in an area near the lipoma. This could indicate that the lipoma is causing nerve damage. Summary  A lipoma is a noncancerous tumor that is made up of fat cells.  Most lipomas do not cause problems and do not require treatment.  If a lipoma is bigger or it causes problems, surgery may be done to remove the lipoma.  Contact a health care provider if your lipoma becomes larger or hard, or if it becomes painful, red, or increasingly swollen. Pain, redness, and swelling could be signs of infection or a more serious condition. This information is not intended to  replace advice given to you by your health care provider. Make sure you discuss any questions you have with your health care provider. Document Revised: 01/16/2019 Document Reviewed: 01/16/2019 Elsevier Patient Education  [place].

## 2019-07-24 NOTE — H&P (View-Only) (Signed)
07/24/2019  History of Present Illness: Karla Carter is a 64 y.o. female presenting for follow-up of a left upper extremity lipoma.  We will obtain a CT scan of the left upper extremity to evaluate the mass and to make sure that it was not encroaching into any essential structures.  CT scan was done on 07/14/2019 and shows this as a large simple lipoma extending peripherally from the left deltoid muscle.  It measures approximately 7.5 x 6.3 x 13.1 cm.  I have independently viewed the images and agree with the findings.  There does not appear to be any essential blood vessels involved with the lipoma.  Patient denies any new findings or symptoms.  Past Medical History: Past Medical History:  Diagnosis Date  . Anxiety   . COPD (chronic obstructive pulmonary disease) ([place])   . Depression   . GERD (gastroesophageal reflux disease)   . Hypertension      Past Surgical History: Past Surgical History:  Procedure Laterality Date  . ABDOMINAL HYSTERECTOMY    . APPENDECTOMY    . COLON SURGERY    . TONSILLECTOMY      Home Medications: Prior to Admission medications   Medication Sig Start Date End Date Taking? Authorizing Provider  albuterol (PROVENTIL HFA;VENTOLIN HFA) 108 (90 Base) MCG/ACT inhaler INHALE 2 PUFFS INTO THE LUNGS EVERY 4 HOURS AS NEEDED FOR WHEEZING ORSHORTNESS OF BREATH 08/23/18  Yes Iloabachie, Chioma E, NP  amLODipine (NORVASC) 10 MG tablet Take 1 tablet (10 mg total) by mouth daily. 06/21/19  Yes Iloabachie, Chioma E, NP  Blood Pressure Monitor KIT 1 kit by Does not apply route daily. 05/10/19  Yes Iloabachie, Chioma E, NP  hydrocortisone cream 1 % Apply 1 application topically 2 (two) times daily. 06/21/19  Yes Iloabachie, Chioma E, NP  MELATONIN PO Take 30 mg by mouth.   Yes [provider]  meloxicam (MOBIC) 7.5 MG tablet Take 1 tablet (7.5 mg total) by mouth daily. 06/21/19  Yes Iloabachie, Chioma E, NP  mirtazapine (REMERON) 15 MG tablet Take 1 tablet (15 mg total) by  mouth at bedtime. 07/18/19 08/17/19 Yes Iloabachie, Chioma E, NP  mometasone-formoterol (DULERA) 100-5 MCG/ACT AERO Inhale 2 puffs into the lungs 2 (two) times daily. Patient taking differently: Inhale 2 puffs into the lungs daily.  07/31/15  Yes Henreitta Leber, MD  rosuvastatin (CRESTOR) 10 MG tablet Take 1 tablet (10 mg total) by mouth daily. 06/21/19  Yes Iloabachie, Chioma E, NP  sertraline (ZOLOFT) 50 MG tablet Take 3 tablets (150 mg total) by mouth daily. 07/18/19 08/17/19 Yes Iloabachie, Chioma E, NP    Allergies: Allergies  Allergen Reactions  . Codeine Rash    Review of Systems: Review of Systems  Constitutional: Negative for chills and fever.  Respiratory: Negative for shortness of breath.   Cardiovascular: Negative for chest pain.  Gastrointestinal: Negative for nausea and vomiting.  Neurological: Negative for sensory change and focal weakness.    Physical Exam BP (!) 174/82   Pulse 86   Temp (!) 97.5 F (36.4 C) (Temporal)   Ht 5' 4"  (1.626 m)   Wt 165 lb 12.8 oz (75.2 kg)   SpO2 94%   BMI 28.46 kg/m  CONSTITUTIONAL: No acute distress HEENT:  Normocephalic, atraumatic, extraocular motion intact. RESPIRATORY:  Lungs are clear, and breath sounds are equal bilaterally. Normal respiratory effort without pathologic use of accessory muscles. CARDIOVASCULAR: Heart is regular without murmurs, gallops, or rubs. GI: The abdomen is soft, nondistended, nontender. SKIN: Patient  has a large left upper extremity soft tissue mass consistent with lipoma at the level of the deltoid muscle.  It is mobile and nontender.   EXTREMITY: There is no sensory change or motor change the left upper extremity.  Patient has very palpable pulses throughout the left upper extremity PSYCH:  Alert and oriented to person, place and time. Affect is normal.  Labs/Imaging: CT scan left upper extremity on 07/14/2019 IMPRESSION: 1. Large simple lipoma extending peripherally from the left deltoid muscle. 2.  Left glenohumeral degenerative changes.  Assessment and Plan: This is a 64 y.o. female with a large left upper extremity lipoma.  -Discussed with the patient the CT scan findings.  At this point it is feasible to remove the lipoma.  Discussed with the patient my surgical plan and that we will be doing this in the operating room given the size of the mass.  Discussed with her the surgery at length and that most likely we would leave a small drain underneath the skin in order to help collapse the cavity created.  Discussed with her the risks of bleeding, infection, and injury to surrounding structures.  She is willing to proceed. -Patient will be scheduled for excision on 08/08/2019.  We will obtain medical clearance as a precaution.  Patient understands that she will require Covid testing prior to surgery.  Face-to-face time spent with the patient and care providers was 25 minutes, with more than 50% of the time spent counseling, educating, and coordinating care of the patient.     Melvyn Neth, [facility]

## 2019-07-24 NOTE — Progress Notes (Signed)
07/24/2019  History of Present Illness: Karla Carter is a 64 y.o. female presenting for follow-up of a left upper extremity lipoma.  We will obtain a CT scan of the left upper extremity to evaluate the mass and to make sure that it was not encroaching into any essential structures.  CT scan was done on 07/14/2019 and shows this as a large simple lipoma extending peripherally from the left deltoid muscle.  It measures approximately 7.5 x 6.3 x 13.1 cm.  I have independently viewed the images and agree with the findings.  There does not appear to be any essential blood vessels involved with the lipoma.  Patient denies any new findings or symptoms.  Past Medical History: Past Medical History:  Diagnosis Date  . Anxiety   . COPD (chronic obstructive pulmonary disease) ([place])   . Depression   . GERD (gastroesophageal reflux disease)   . Hypertension      Past Surgical History: Past Surgical History:  Procedure Laterality Date  . ABDOMINAL HYSTERECTOMY    . APPENDECTOMY    . COLON SURGERY    . TONSILLECTOMY      Home Medications: Prior to Admission medications   Medication Sig Start Date End Date Taking? Authorizing Provider  albuterol (PROVENTIL HFA;VENTOLIN HFA) 108 (90 Base) MCG/ACT inhaler INHALE 2 PUFFS INTO THE LUNGS EVERY 4 HOURS AS NEEDED FOR WHEEZING ORSHORTNESS OF BREATH 08/23/18  Yes Iloabachie, Chioma E, NP  amLODipine (NORVASC) 10 MG tablet Take 1 tablet (10 mg total) by mouth daily. 06/21/19  Yes Iloabachie, Chioma E, NP  Blood Pressure Monitor KIT 1 kit by Does not apply route daily. 05/10/19  Yes Iloabachie, Chioma E, NP  hydrocortisone cream 1 % Apply 1 application topically 2 (two) times daily. 06/21/19  Yes Iloabachie, Chioma E, NP  MELATONIN PO Take 30 mg by mouth.   Yes [provider]  meloxicam (MOBIC) 7.5 MG tablet Take 1 tablet (7.5 mg total) by mouth daily. 06/21/19  Yes Iloabachie, Chioma E, NP  mirtazapine (REMERON) 15 MG tablet Take 1 tablet (15 mg total) by  mouth at bedtime. 07/18/19 08/17/19 Yes Iloabachie, Chioma E, NP  mometasone-formoterol (DULERA) 100-5 MCG/ACT AERO Inhale 2 puffs into the lungs 2 (two) times daily. Patient taking differently: Inhale 2 puffs into the lungs daily.  07/31/15  Yes Henreitta Leber, MD  rosuvastatin (CRESTOR) 10 MG tablet Take 1 tablet (10 mg total) by mouth daily. 06/21/19  Yes Iloabachie, Chioma E, NP  sertraline (ZOLOFT) 50 MG tablet Take 3 tablets (150 mg total) by mouth daily. 07/18/19 08/17/19 Yes Iloabachie, Chioma E, NP    Allergies: Allergies  Allergen Reactions  . Codeine Rash    Review of Systems: Review of Systems  Constitutional: Negative for chills and fever.  Respiratory: Negative for shortness of breath.   Cardiovascular: Negative for chest pain.  Gastrointestinal: Negative for nausea and vomiting.  Neurological: Negative for sensory change and focal weakness.    Physical Exam BP (!) 174/82   Pulse 86   Temp (!) 97.5 F (36.4 C) (Temporal)   Ht 5' 4"  (1.626 m)   Wt 165 lb 12.8 oz (75.2 kg)   SpO2 94%   BMI 28.46 kg/m  CONSTITUTIONAL: No acute distress HEENT:  Normocephalic, atraumatic, extraocular motion intact. RESPIRATORY:  Lungs are clear, and breath sounds are equal bilaterally. Normal respiratory effort without pathologic use of accessory muscles. CARDIOVASCULAR: Heart is regular without murmurs, gallops, or rubs. GI: The abdomen is soft, nondistended, nontender. SKIN: Patient  has a large left upper extremity soft tissue mass consistent with lipoma at the level of the deltoid muscle.  It is mobile and nontender.   EXTREMITY: There is no sensory change or motor change the left upper extremity.  Patient has very palpable pulses throughout the left upper extremity PSYCH:  Alert and oriented to person, place and time. Affect is normal.  Labs/Imaging: CT scan left upper extremity on 07/14/2019 IMPRESSION: 1. Large simple lipoma extending peripherally from the left deltoid muscle. 2.  Left glenohumeral degenerative changes.  Assessment and Plan: This is a 64 y.o. female with a large left upper extremity lipoma.  -Discussed with the patient the CT scan findings.  At this point it is feasible to remove the lipoma.  Discussed with the patient my surgical plan and that we will be doing this in the operating room given the size of the mass.  Discussed with her the surgery at length and that most likely we would leave a small drain underneath the skin in order to help collapse the cavity created.  Discussed with her the risks of bleeding, infection, and injury to surrounding structures.  She is willing to proceed. -Patient will be scheduled for excision on 08/08/2019.  We will obtain medical clearance as a precaution.  Patient understands that she will require Covid testing prior to surgery.  Face-to-face time spent with the patient and care providers was 25 minutes, with more than 50% of the time spent counseling, educating, and coordinating care of the patient.     Melvyn Neth, [facility]

## 2019-07-26 ENCOUNTER — Telehealth: Payer: Self-pay | Admitting: Surgery

## 2019-07-26 NOTE — Telephone Encounter (Signed)
Pt has been advised of pre admission date/time, Covid Testing date and Surgery date.  Surgery Date: 08/08/19 Preadmission Testing Date: 08/02/19 (8a-1p) Covid Testing Date: 08/04/19 - patient advised to go to the [facility] ([place])  Patient has been made aware to call [phone], between 1-3:00pm the day before surgery, to find out what time to arrive.

## 2019-08-01 ENCOUNTER — Other Ambulatory Visit: Payer: Self-pay

## 2019-08-01 ENCOUNTER — Ambulatory Visit: Payer: Self-pay | Admitting: Licensed Clinical Social Worker

## 2019-08-01 ENCOUNTER — Ambulatory Visit: Payer: Self-pay | Admitting: Gerontology

## 2019-08-01 DIAGNOSIS — F331 Major depressive disorder, recurrent, moderate: Secondary | ICD-10-CM

## 2019-08-01 NOTE — Progress Notes (Signed)
Medical clearance received from Dr Meredith Leeds office. Patient cleared for surgery at low risk.

## 2019-08-01 NOTE — BH Specialist Note (Signed)
Integrated Behavioral Health Follow Up Visit Via Phone  MRN: [id number] Name: Karla Carter  Type of Service: [place] Interpretor:No. Interpretor Name and Language: not applicable.   SUBJECTIVE: Karla Carter is a 63 y.o. female accompanied by herself. Patient was referred by self for mental health. Patient reports the following symptoms/concerns: She discussed an argument she had with her husband this past weekend over something trivial. She notes that her husband did end up buying her a ROKU device for her TV on valentine's day. She notes that other than the one issue with her husband that things have been good. She notes that she has been doing better with her sleep and is not having taking melatonin every night. She notes that she is supposed to have her pre admission for her arm tomorrow, 19th COVID test, and February 23rd gets the lump off of her arm removed. She notes that she has been crocheting so much that her left wrist hurts. She denies suicidal and homicidal thoughts.  Duration of problem: ; Severity of problem: mild  OBJECTIVE: Mood: Euthymic and Affect: Appropriate Risk of harm to self or others: No plan to harm self or others  LIFE CONTEXT: Family and Social: see above. School/Work: see above. Self-Care: see above. Life Changes: see above.   GOALS ADDRESSED: Patient will: 1.  Reduce symptoms of: stress  2.  Increase knowledge and/or ability of: self-management skills and stress reduction  3.  Demonstrate ability to: Increase healthy adjustment to current life circumstances  INTERVENTIONS: Interventions utilized:  Supportive Counseling was utilized by the clinician during today's follow up session. Clinician processed with the patient regarding how she has been doing since the last follow up session. Clinician utilized reflective listening encouraging the patient to ventilate her feelings towards her current situation. Clinician  discussed with the patient how things are in terms of her depression and measured her symptoms on a numerical scale. Clinician explained to the patient that it sounds like she is relieved to be getting the lump removed from her arm and be one less stressor that she has to deal with.  Standardized Assessments completed: PHQ 9  ASSESSMENT: Patient currently experiencing see above.   Patient may benefit from see above.  PLAN: 1. Follow up with behavioral health clinician on : two to three weeks or earlier if needed.  2. Behavioral recommendations: see above.  3. Referral(s): [place] (In Clinic) 4. "From scale of 1-10, how likely are you to follow plan?":   Bayard Hugger, LCSW

## 2019-08-02 ENCOUNTER — Encounter
Admission: RE | Admit: 2019-08-02 | Discharge: 2019-08-02 | Disposition: A | Discharge status: Home / Self Care | Payer: Self-pay | Source: Ambulatory Visit | Attending: Surgery | Admitting: Surgery

## 2019-08-02 DIAGNOSIS — I10 Essential (primary) hypertension: Secondary | ICD-10-CM | POA: Insufficient documentation

## 2019-08-02 DIAGNOSIS — Z01818 Encounter for other preprocedural examination: Secondary | ICD-10-CM | POA: Insufficient documentation

## 2019-08-02 NOTE — Patient Instructions (Signed)
Your procedure is scheduled on: Tuesday 08/08/19 To find out your arrival time please call [phone] between 1PM - 3PM on Monday 08/07/19   Remember: Instructions that are not followed completely may result in serious medical risk,  up to and including death, or upon the discretion of your surgeon and anesthesiologist your  surgery may need to be rescheduled.     _X__ 1. Do not eat food after midnight the night before your procedure.                 No gum chewing or hard candies. You may drink clear liquids up to 2 hours                 before you are scheduled to arrive for your surgery- DO not drink clear                 liquids within 2 hours of the start of your surgery.                 Clear Liquids include:  water, apple juice without pulp, clear Gatorade, G2 or                  Gatorade Zero (avoid Red/Purple/Blue), Black Coffee or Tea (Do not add                 anything to coffee or tea).   __X__2.  On the morning of surgery brush your teeth with toothpaste and water, you                may rinse your mouth with mouthwash if you wish.  Do not swallow any toothpaste of mouthwash.     _X__ 3.  No Alcohol for 24 hours before or after surgery.    _X__ 4.  Do Not Smoke or use e-cigarettes For 24 Hours Prior to Your Surgery.                 Do not use any chewable tobacco products for at least 6 hours prior to                 surgery.   __x__  5.  Notify your doctor if there is any change in your medical condition      (cold, fever, infections).      Do not wear jewelry, make-up, hairpins, clips or nail polish. Do not wear lotions, powders, or perfumes. You may wear deodorant. Do not shave 48 hours prior to surgery. Men may shave face and neck. Do not bring valuables to the hospital.     [facility] is not responsible for any belongings or valuables.  Contacts, dentures or bridgework may not be worn into surgery. Leave your suitcase in the car.  After surgery it may be brought to your room. For patients admitted to the hospital, discharge time is determined by your treatment team.   Patients discharged the day of surgery will not be allowed to drive home.   Make arrangements for someone to be with you for the first 24 hours of your Same Day Discharge.     __x__ Take these medicines the morning of surgery with A SIP OF WATER:    1. amLODipine (NORVASC)   __x__ Use CHG Soap as directed shower the night before and the Day of Surgery    __x__ Use inhalers on the day of surgery albuterol (PROVENTIL HFA;VENTOLIN HFA)    __x__ Stop Anti-inflammatories on meloxicam (  MOBIC), aspirin, ibuprofen, aleve and or BC powders.    __x__ Stop supplements until after surgery.   __x__ Do not start any herbal supplements before your surgery.

## 2019-08-03 ENCOUNTER — Ambulatory Visit: Payer: Self-pay | Admitting: Gerontology

## 2019-08-04 ENCOUNTER — Encounter
Admission: RE | Admit: 2019-08-04 | Discharge: 2019-08-04 | Disposition: A | Discharge status: Home / Self Care | Payer: Self-pay | Source: Ambulatory Visit | Attending: Surgery | Admitting: Surgery

## 2019-08-04 ENCOUNTER — Other Ambulatory Visit: Payer: Self-pay

## 2019-08-04 DIAGNOSIS — Z20822 Contact with and (suspected) exposure to covid-19: Secondary | ICD-10-CM | POA: Insufficient documentation

## 2019-08-04 DIAGNOSIS — Z01818 Encounter for other preprocedural examination: Secondary | ICD-10-CM | POA: Insufficient documentation

## 2019-08-04 DIAGNOSIS — I10 Essential (primary) hypertension: Secondary | ICD-10-CM | POA: Insufficient documentation

## 2019-08-04 LAB — SARS CORONAVIRUS 2 (TAT 6-24 HRS): SARS Coronavirus 2: NEGATIVE

## 2019-08-04 NOTE — Pre-Procedure Instructions (Signed)
EKG reviewed by DR [email or web address] secure chat. "I think the lateral leads look pretty consistent with her previous EKG. If she is not having cardiac symptoms (CP, SOB) I think we are ok to proceed, thanks"  Patient denies symptoms/cn

## 2019-08-07 MED ORDER — CEFAZOLIN SODIUM-DEXTROSE 2-4 GM/100ML-% IV SOLN
2.0000 g | INTRAVENOUS | Status: AC
  Administered 2019-08-08: 2 g via INTRAVENOUS

## 2019-08-08 ENCOUNTER — Encounter: Payer: Self-pay | Admitting: Surgery

## 2019-08-08 ENCOUNTER — Ambulatory Visit: Payer: Self-pay | Admitting: Anesthesiology

## 2019-08-08 ENCOUNTER — Ambulatory Visit
Admission: RE | Admit: 2019-08-08 | Discharge: 2019-08-08 | Disposition: A | Discharge status: Home / Self Care | Payer: Self-pay | Source: Ambulatory Visit | Attending: Surgery | Admitting: Surgery

## 2019-08-08 ENCOUNTER — Encounter
Admission: RE | Disposition: A | Discharge status: Home / Self Care | Payer: Self-pay | Source: Ambulatory Visit | Attending: Surgery

## 2019-08-08 DIAGNOSIS — Z885 Allergy status to narcotic agent status: Secondary | ICD-10-CM | POA: Insufficient documentation

## 2019-08-08 DIAGNOSIS — F419 Anxiety disorder, unspecified: Secondary | ICD-10-CM | POA: Insufficient documentation

## 2019-08-08 DIAGNOSIS — Z791 Long term (current) use of non-steroidal anti-inflammatories (NSAID): Secondary | ICD-10-CM | POA: Insufficient documentation

## 2019-08-08 DIAGNOSIS — F17200 Nicotine dependence, unspecified, uncomplicated: Secondary | ICD-10-CM | POA: Insufficient documentation

## 2019-08-08 DIAGNOSIS — I10 Essential (primary) hypertension: Secondary | ICD-10-CM | POA: Insufficient documentation

## 2019-08-08 DIAGNOSIS — Z7951 Long term (current) use of inhaled steroids: Secondary | ICD-10-CM | POA: Insufficient documentation

## 2019-08-08 DIAGNOSIS — F329 Major depressive disorder, single episode, unspecified: Secondary | ICD-10-CM | POA: Insufficient documentation

## 2019-08-08 DIAGNOSIS — J449 Chronic obstructive pulmonary disease, unspecified: Secondary | ICD-10-CM | POA: Insufficient documentation

## 2019-08-08 DIAGNOSIS — D1722 Benign lipomatous neoplasm of skin and subcutaneous tissue of left arm: Secondary | ICD-10-CM | POA: Insufficient documentation

## 2019-08-08 DIAGNOSIS — Z79899 Other long term (current) drug therapy: Secondary | ICD-10-CM | POA: Insufficient documentation

## 2019-08-08 HISTORY — PX: LIPOMA EXCISION: SHX5283

## 2019-08-08 SURGERY — EXCISION LIPOMA
Anesthesia: General | Site: Arm Upper | Laterality: Left

## 2019-08-08 MED ORDER — SEVOFLURANE IN SOLN
RESPIRATORY_TRACT | Status: AC
  Filled 2019-08-08: qty 250

## 2019-08-08 MED ORDER — ACETAMINOPHEN 500 MG PO TABS: 1000.0000 mg | ORAL_TABLET | ORAL | Status: AC

## 2019-08-08 MED ORDER — SUCCINYLCHOLINE CHLORIDE 20 MG/ML IJ SOLN
INTRAMUSCULAR | Status: AC
  Filled 2019-08-08: qty 1

## 2019-08-08 MED ORDER — FAMOTIDINE 20 MG PO TABS
ORAL_TABLET | ORAL | Status: AC
  Administered 2019-08-08: 20 mg via ORAL
  Filled 2019-08-08: qty 1

## 2019-08-08 MED ORDER — ACETAMINOPHEN 500 MG PO TABS
ORAL_TABLET | ORAL | Status: AC
  Administered 2019-08-08: 1000 mg via ORAL
  Filled 2019-08-08: qty 2

## 2019-08-08 MED ORDER — OXYCODONE HCL 5 MG PO TABS: 5.0000 mg | ORAL_TABLET | ORAL | 0 refills | Status: DC | PRN

## 2019-08-08 MED ORDER — LIDOCAINE HCL (CARDIAC) PF 100 MG/5ML IV SOSY
PREFILLED_SYRINGE | INTRAVENOUS | Status: DC | PRN
  Administered 2019-08-08: 60 mg via INTRAVENOUS

## 2019-08-08 MED ORDER — PHENYLEPHRINE HCL (PRESSORS) 10 MG/ML IV SOLN
INTRAVENOUS | Status: AC
  Filled 2019-08-08: qty 1

## 2019-08-08 MED ORDER — IPRATROPIUM-ALBUTEROL 0.5-2.5 (3) MG/3ML IN SOLN
RESPIRATORY_TRACT | Status: AC
  Administered 2019-08-08: 3 mL via RESPIRATORY_TRACT
  Filled 2019-08-08: qty 3

## 2019-08-08 MED ORDER — DEXAMETHASONE SODIUM PHOSPHATE 10 MG/ML IJ SOLN
INTRAMUSCULAR | Status: DC | PRN
  Administered 2019-08-08: 10 mg via INTRAVENOUS

## 2019-08-08 MED ORDER — PHENYLEPHRINE HCL (PRESSORS) 10 MG/ML IV SOLN
INTRAVENOUS | Status: DC | PRN
  Administered 2019-08-08 (×6): 100 ug via INTRAVENOUS

## 2019-08-08 MED ORDER — IPRATROPIUM-ALBUTEROL 0.5-2.5 (3) MG/3ML IN SOLN: 3.0000 mL | Freq: Once | RESPIRATORY_TRACT | Status: AC

## 2019-08-08 MED ORDER — LIDOCAINE HCL (PF) 2 % IJ SOLN
INTRAMUSCULAR | Status: AC
  Filled 2019-08-08: qty 5

## 2019-08-08 MED ORDER — SODIUM CHLORIDE FLUSH 0.9 % IV SOLN
INTRAVENOUS | Status: AC
  Filled 2019-08-08: qty 10

## 2019-08-08 MED ORDER — BUPIVACAINE LIPOSOME 1.3 % IJ SUSP
INTRAMUSCULAR | Status: DC | PRN
  Administered 2019-08-08: 20 mL

## 2019-08-08 MED ORDER — DEXMEDETOMIDINE HCL IN NACL 80 MCG/20ML IV SOLN
INTRAVENOUS | Status: AC
  Filled 2019-08-08: qty 20

## 2019-08-08 MED ORDER — KETOROLAC TROMETHAMINE 30 MG/ML IJ SOLN
INTRAMUSCULAR | Status: DC | PRN
  Administered 2019-08-08: 30 mg via INTRAVENOUS

## 2019-08-08 MED ORDER — ONDANSETRON HCL 4 MG/2ML IJ SOLN
INTRAMUSCULAR | Status: AC
  Filled 2019-08-08: qty 2

## 2019-08-08 MED ORDER — PROPOFOL 500 MG/50ML IV EMUL
INTRAVENOUS | Status: AC
  Filled 2019-08-08: qty 50

## 2019-08-08 MED ORDER — MIDAZOLAM HCL 2 MG/2ML IJ SOLN
INTRAMUSCULAR | Status: AC
  Filled 2019-08-08: qty 2

## 2019-08-08 MED ORDER — CHLORHEXIDINE GLUCONATE CLOTH 2 % EX PADS: 6.0000 | MEDICATED_PAD | Freq: Once | CUTANEOUS | Status: DC

## 2019-08-08 MED ORDER — DEXMEDETOMIDINE HCL 200 MCG/2ML IV SOLN
INTRAVENOUS | Status: DC | PRN
  Administered 2019-08-08: 16 ug via INTRAVENOUS

## 2019-08-08 MED ORDER — BUPIVACAINE-EPINEPHRINE 0.5% -1:200000 IJ SOLN
INTRAMUSCULAR | Status: DC | PRN
  Administered 2019-08-08: 30 mL

## 2019-08-08 MED ORDER — CEFAZOLIN SODIUM-DEXTROSE 2-4 GM/100ML-% IV SOLN
INTRAVENOUS | Status: AC
  Filled 2019-08-08: qty 100

## 2019-08-08 MED ORDER — GABAPENTIN 300 MG PO CAPS: 300.0000 mg | ORAL_CAPSULE | ORAL | Status: AC

## 2019-08-08 MED ORDER — MIDAZOLAM HCL 2 MG/2ML IJ SOLN
INTRAMUSCULAR | Status: DC | PRN
  Administered 2019-08-08: 2 mg via INTRAVENOUS

## 2019-08-08 MED ORDER — PROPOFOL 10 MG/ML IV BOLUS: INTRAVENOUS | Status: DC | PRN

## 2019-08-08 MED ORDER — FENTANYL CITRATE (PF) 100 MCG/2ML IJ SOLN
INTRAMUSCULAR | Status: DC | PRN
  Administered 2019-08-08 (×2): 50 ug via INTRAVENOUS

## 2019-08-08 MED ORDER — FENTANYL CITRATE (PF) 100 MCG/2ML IJ SOLN: 25.0000 ug | INTRAMUSCULAR | Status: DC | PRN

## 2019-08-08 MED ORDER — LACTATED RINGERS IV SOLN: INTRAVENOUS | Status: DC

## 2019-08-08 MED ORDER — BUPIVACAINE LIPOSOME 1.3 % IJ SUSP: 20.0000 mL | Freq: Once | INTRAMUSCULAR | Status: DC

## 2019-08-08 MED ORDER — PROPOFOL 10 MG/ML IV BOLUS
INTRAVENOUS | Status: DC | PRN
  Administered 2019-08-08: 120 mg via INTRAVENOUS

## 2019-08-08 MED ORDER — ROCURONIUM BROMIDE 100 MG/10ML IV SOLN
INTRAVENOUS | Status: DC | PRN
  Administered 2019-08-08: 40 mg via INTRAVENOUS

## 2019-08-08 MED ORDER — FENTANYL CITRATE (PF) 100 MCG/2ML IJ SOLN
INTRAMUSCULAR | Status: AC
  Filled 2019-08-08: qty 2

## 2019-08-08 MED ORDER — ONDANSETRON HCL 4 MG/2ML IJ SOLN
INTRAMUSCULAR | Status: DC | PRN
  Administered 2019-08-08: 4 mg via INTRAVENOUS

## 2019-08-08 MED ORDER — IBUPROFEN 600 MG PO TABS: 600.0000 mg | ORAL_TABLET | Freq: Three times a day (TID) | ORAL | 1 refills | Status: DC | PRN

## 2019-08-08 MED ORDER — ONDANSETRON HCL 4 MG/2ML IJ SOLN
4.0000 mg | Freq: Once | INTRAMUSCULAR | Status: AC | PRN
  Administered 2019-08-08: 12:00:00 4 mg via INTRAVENOUS

## 2019-08-08 MED ORDER — CHLORHEXIDINE GLUCONATE CLOTH 2 % EX PADS
6.0000 | MEDICATED_PAD | Freq: Once | CUTANEOUS | Status: AC
  Administered 2019-08-08: 6 via TOPICAL

## 2019-08-08 MED ORDER — EPHEDRINE SULFATE 50 MG/ML IJ SOLN
INTRAMUSCULAR | Status: AC
  Filled 2019-08-08: qty 1

## 2019-08-08 MED ORDER — ROCURONIUM BROMIDE 50 MG/5ML IV SOLN
INTRAVENOUS | Status: AC
  Filled 2019-08-08: qty 1

## 2019-08-08 MED ORDER — FAMOTIDINE 20 MG PO TABS: 20.0000 mg | ORAL_TABLET | Freq: Once | ORAL | Status: AC

## 2019-08-08 MED ORDER — GABAPENTIN 300 MG PO CAPS
ORAL_CAPSULE | ORAL | Status: AC
  Administered 2019-08-08: 300 mg via ORAL
  Filled 2019-08-08: qty 1

## 2019-08-08 MED ORDER — LACTATED RINGERS IV SOLN: INTRAVENOUS | Status: DC | PRN

## 2019-08-08 MED ORDER — DEXAMETHASONE SODIUM PHOSPHATE 10 MG/ML IJ SOLN
INTRAMUSCULAR | Status: AC
  Filled 2019-08-08: qty 1

## 2019-08-08 MED ORDER — SUGAMMADEX SODIUM 200 MG/2ML IV SOLN
INTRAVENOUS | Status: DC | PRN
  Administered 2019-08-08: 200 mg via INTRAVENOUS

## 2019-08-08 SURGICAL SUPPLY — 40 items
BULB RESERV EVAC DRAIN JP 100C (MISCELLANEOUS) ×2 IMPLANT
CHLORAPREP W/TINT 26 (MISCELLANEOUS) ×3 IMPLANT
COVER WAND RF STERILE (DRAPES) ×3 IMPLANT
DERMABOND ADVANCED (GAUZE/BANDAGES/DRESSINGS) ×2
DERMABOND ADVANCED .7 DNX12 (GAUZE/BANDAGES/DRESSINGS) ×1 IMPLANT
DRAIN CHANNEL JP 15F RND 16 (MISCELLANEOUS) ×2 IMPLANT
DRAIN JP 10F RND SILICONE (MISCELLANEOUS) IMPLANT
DRAPE 3/4 80X56 (DRAPES) ×2 IMPLANT
DRAPE LAPAROTOMY 100X77 ABD (DRAPES) ×1 IMPLANT
DRAPE LAPAROTOMY 77X122 PED (DRAPES) ×2 IMPLANT
DRSG TEGADERM 2-3/8X2-3/4 SM (GAUZE/BANDAGES/DRESSINGS) ×2 IMPLANT
ELECT CAUTERY BLADE 6.4 (BLADE) ×3 IMPLANT
ELECT REM PT RETURN 9FT ADLT (ELECTROSURGICAL) ×3
ELECTRODE REM PT RTRN 9FT ADLT (ELECTROSURGICAL) ×1 IMPLANT
GLOVE SURG SYN 7.0 (GLOVE) ×3 IMPLANT
GLOVE SURG SYN 7.0 PF PI (GLOVE) ×1 IMPLANT
GLOVE SURG SYN 7.5  E (GLOVE) ×2
GLOVE SURG SYN 7.5 E (GLOVE) ×1 IMPLANT
GLOVE SURG SYN 7.5 PF PI (GLOVE) ×1 IMPLANT
GOWN STRL REUS W/ TWL LRG LVL3 (GOWN DISPOSABLE) ×2 IMPLANT
GOWN STRL REUS W/TWL LRG LVL3 (GOWN DISPOSABLE) ×4
KIT TURNOVER KIT A (KITS) ×3 IMPLANT
LABEL OR SOLS (LABEL) ×3 IMPLANT
MARKER SKIN DUAL TIP RULER LAB (MISCELLANEOUS) ×2 IMPLANT
NEEDLE HYPO 22GX1.5 SAFETY (NEEDLE) ×3 IMPLANT
NS IRRIG 1000ML POUR BTL (IV SOLUTION) ×3 IMPLANT
PACK BASIN MINOR ARMC (MISCELLANEOUS) ×3 IMPLANT
SUT ETHILON 3-0 FS-10 30 BLK (SUTURE) ×3
SUT MNCRL 4-0 (SUTURE) ×2
SUT MNCRL 4-0 27XMFL (SUTURE) ×1
SUT SILK 3 0 (SUTURE) ×2
SUT SILK 3 0 SH 30 (SUTURE) ×2 IMPLANT
SUT SILK 3-0 18XBRD TIE 12 (SUTURE) IMPLANT
SUT VIC AB 0 SH 27 (SUTURE) ×6 IMPLANT
SUT VIC AB 3-0 SH 27 (SUTURE) ×4
SUT VIC AB 3-0 SH 27X BRD (SUTURE) ×2 IMPLANT
SUTURE EHLN 3-0 FS-10 30 BLK (SUTURE) IMPLANT
SUTURE MNCRL 4-0 27XMF (SUTURE) ×1 IMPLANT
SYR 30ML LL (SYRINGE) ×3 IMPLANT
SYR BULB IRRIG 60ML STRL (SYRINGE) ×2 IMPLANT

## 2019-08-08 NOTE — Progress Notes (Signed)
08/08/19  Left upper arm mass

## 2019-08-08 NOTE — Anesthesia Procedure Notes (Signed)
Procedure Name: Intubation Date/Time: 08/08/2019 9:33 AM Performed by: Justus Memory, CRNA Pre-anesthesia Checklist: Patient identified, Patient being monitored, Timeout performed, Emergency Drugs available and Suction available Patient Re-evaluated:Patient Re-evaluated prior to induction Oxygen Delivery Method: Circle system utilized Preoxygenation: Pre-oxygenation with 100% oxygen Induction Type: IV induction Ventilation: Mask ventilation with difficulty Laryngoscope Size: 3 and McGraph Grade View: Grade I Tube type: Oral Tube size: 7.0 mm Number of attempts: 1 Airway Equipment and Method: Stylet and Video-laryngoscopy Placement Confirmation: ETT inserted through vocal cords under direct vision,  positive ETCO2 and breath sounds checked- equal and bilateral Secured at: 21 cm Tube secured with: Tape Dental Injury: Teeth and Oropharynx as per pre-operative assessment

## 2019-08-08 NOTE — Discharge Instructions (Addendum)
AMBULATORY SURGERY  °DISCHARGE INSTRUCTIONS ° ° °1) The drugs that you were given will stay in your system until tomorrow so for the next 24 hours you should not: ° °A) Drive an automobile °B) Make any legal decisions °C) Drink any alcoholic beverage ° ° °2) You may resume regular meals tomorrow.  Today it is better to start with liquids and gradually work up to solid foods. ° °You may eat anything you prefer, but it is better to start with liquids, then soup and crackers, and gradually work up to solid foods. ° ° °3) Please notify your doctor immediately if you have any unusual bleeding, trouble breathing, redness and pain at the surgery site, drainage, fever, or pain not relieved by medication. ° ° ° °4) Additional Instructions: ° ° ° ° ° ° ° °Please contact your physician with any problems or Same Day Surgery at [phone], Monday through Friday 6 am to 4 pm, or [place] [facility] [place] Main number at [phone].AMBULATORY SURGERY  °DISCHARGE INSTRUCTIONS ° ° °5) The drugs that you were given will stay in your system until tomorrow so for the next 24 hours you should not: ° °D) Drive an automobile °E) Make any legal decisions °F) Drink any alcoholic beverage ° ° °6) You may resume regular meals tomorrow.  Today it is better to start with liquids and gradually work up to solid foods. ° °You may eat anything you prefer, but it is better to start with liquids, then soup and crackers, and gradually work up to solid foods. ° ° °7) Please notify your doctor immediately if you have any unusual bleeding, trouble breathing, redness and pain at the surgery site, drainage, fever, or pain not relieved by medication. ° ° ° °8) Additional Instructions: ° ° ° ° ° ° ° °Please contact your physician with any problems or Same Day Surgery at [phone], Monday through Friday 6 am to 4 pm, or [place] [facility] [place] Main number at [phone]. °

## 2019-08-08 NOTE — Anesthesia Preprocedure Evaluation (Signed)
Anesthesia Evaluation  Patient identified by MRN, date of birth, ID band Patient awake    Reviewed: Allergy & Precautions, H&P , NPO status , Patient's Chart, lab work & pertinent test results, reviewed documented beta blocker date and time   Airway Mallampati: II  TM Distance: >3 FB Neck ROM: full    Dental  (+) Teeth Intact   Pulmonary COPD,  COPD inhaler, Current Smoker,    Pulmonary exam normal        Cardiovascular Exercise Tolerance: Good hypertension, On Medications negative cardio ROS Normal cardiovascular exam Rhythm:regular Rate:Normal     Neuro/Psych PSYCHIATRIC DISORDERS Anxiety Depression negative neurological ROS     GI/Hepatic Neg liver ROS, GERD  Medicated,  Endo/Other  negative endocrine ROS  Renal/GU negative Renal ROS  negative genitourinary   Musculoskeletal   Abdominal   Peds  Hematology negative hematology ROS (+)   Anesthesia Other Findings Past Medical History: No date: Anxiety No date: COPD (chronic obstructive pulmonary disease) (HCC) No date: Depression No date: GERD (gastroesophageal reflux disease) No date: Hypertension Past Surgical History: No date: ABDOMINAL HYSTERECTOMY No date: APPENDECTOMY No date: COLON SURGERY No date: TONSILLECTOMY   Reproductive/Obstetrics negative OB ROS                             Anesthesia Physical Anesthesia Plan  ASA: III  Anesthesia Plan: General ETT   Post-op Pain Management:    Induction:   PONV Risk Score and Plan: 3  Airway Management Planned:   Additional Equipment:   Intra-op Plan:   Post-operative Plan:   Informed Consent: I have reviewed the patients History and Physical, chart, labs and discussed the procedure including the risks, benefits and alternatives for the proposed anesthesia with the patient or authorized representative who has indicated his/her understanding and acceptance.      Dental Advisory Given  Plan Discussed with: CRNA  Anesthesia Plan Comments:         Anesthesia Quick Evaluation

## 2019-08-08 NOTE — Op Note (Addendum)
  Procedure Date:  08/08/2019  Pre-operative Diagnosis:  Left arm mass  Post-operative Diagnosis:  Left arm mass  Procedure:  Excision of left arm mass  Surgeon:  Melvyn Neth, MD  Assistant:  Gaynelle Arabian, PA-S  Anesthesia:  General endotracheal  Estimated Blood Loss:  5 ml  Specimens:  Left arm mass  Complications:  None  Findings:  13 x 8 x 5 cm mass.  Indications for Procedure:  This is a 64 y.o. female with diagnosis of a symptomatic left arm mass, likely a lipoma.  The patient wishes to have this excised. The risks of bleeding, abscess or infection, injury to surrounding structures, and need for further procedures were all discussed with the patient and she was willing to proceed.  Description of Procedure: The patient was correctly identified in the preoperative area and brought into the operating room.  The patient was placed supine with VTE prophylaxis in place.  Appropriate time-outs were performed.  Anesthesia was induced and the patient was intubated.  Appropriate antibiotics were infused.  The patient was placed on right lateral position to better expose her left upper arm / shoulder area.  The patient's left shoulder and upper arm were prepped and draped in usual sterile fashion.  A 10 cm incision was made over the lipoma, and cautery was used to dissect down the subcutaneous tissue to the lipoma itself.  Skin flaps were created using cautery as well, and then the lipoma was excised using cautery, intact.  Superficial veins were ligated and cut in order to get down to the mass.  It was sent off to pathology.  The cavity was then irrigated and hemostasis was assured with cautery.  Local anesthetic was infused intradermally.  A 15 Fr. Blake drain was placed into the cavity.  The wound edges were then closed in three layers using 2-0 Vicryl, 3-0 Vicryl and 4-0 Monocryl.  The drain was secured to skin using 3-0 Nylon.  The incision was cleaned and sealed with  DermaBond.  The patient was then emerged from anesthesia, extubated, and brought to the recovery room for further management.    The patient tolerated the procedure well and all counts were correct at the end of the case.   Melvyn Neth, MD

## 2019-08-08 NOTE — Interval H&P Note (Signed)
History and Physical Interval Note:  08/08/2019 8:32 AM  Karla Carter  has presented today for surgery, with the diagnosis of Left arm lipoma.  The various methods of treatment have been discussed with the patient and family. After consideration of risks, benefits and other options for treatment, the patient has consented to  Procedure(s): EXCISION LIPOMA Left Arm (Left) as a surgical intervention.  The patient's history has been reviewed, patient examined, no change in status, stable for surgery.  I have reviewed the patient's chart and labs.  Questions were answered to the patient's satisfaction.     Savaya Hakes

## 2019-08-08 NOTE — Transfer of Care (Signed)
Immediate Anesthesia Transfer of Care Note  Patient: Karla Carter  Procedure(s) Performed: EXCISION LIPOMA Left Arm (Left Arm Upper)  Patient Location: PACU  Anesthesia Type:General  Level of Consciousness: sedated  Airway & Oxygen Therapy: Patient Spontanous Breathing and Patient connected to face mask oxygen  Post-op Assessment: Report given to RN and Post -op Vital signs reviewed and stable  Post vital signs: Reviewed and stable  Last Vitals:  Vitals Value Taken Time  BP 143/59 08/08/19 1142  Temp 36.3 C 08/08/19 1142  Pulse 80 08/08/19 1147  Resp 15 08/08/19 1147  SpO2 97 % 08/08/19 1147  Vitals shown include unvalidated device data.  Last Pain:  Vitals:   08/08/19 0830  TempSrc: Tympanic  PainSc: 0-No pain         Complications: No apparent anesthesia complications

## 2019-08-09 LAB — SURGICAL PATHOLOGY

## 2019-08-09 NOTE — Anesthesia Postprocedure Evaluation (Signed)
Anesthesia Post Note  Patient: Karla Carter  Procedure(s) Performed: EXCISION LIPOMA Left Arm (Left Arm Upper)  Patient location during evaluation: PACU Anesthesia Type: General Level of consciousness: awake and alert Pain management: pain level controlled Vital Signs Assessment: post-procedure vital signs reviewed and stable Respiratory status: spontaneous breathing, nonlabored ventilation, respiratory function stable and patient connected to nasal cannula oxygen Cardiovascular status: blood pressure returned to baseline and stable Postop Assessment: no apparent nausea or vomiting Anesthetic complications: no     Last Vitals:  Vitals:   08/08/19 1342 08/08/19 1355  BP: (!) 133/91   Pulse: 70 (P) 74  Resp: 14 (P) 18  Temp: (!) 36.3 C (!) (P) 36.1 C  SpO2: 94% (P) 93%    Last Pain:  Vitals:   08/09/19 0822  TempSrc:   PainSc: 0-No pain                 Martha Clan

## 2019-08-16 ENCOUNTER — Other Ambulatory Visit: Payer: Self-pay

## 2019-08-16 ENCOUNTER — Encounter: Payer: Self-pay | Admitting: Surgery

## 2019-08-16 ENCOUNTER — Ambulatory Visit (INDEPENDENT_AMBULATORY_CARE_PROVIDER_SITE_OTHER): Payer: Self-pay | Admitting: Surgery

## 2019-08-16 VITALS — BP 147/81 | HR 92 | Temp 97.9°F | Resp 12 | Ht 64.0 in | Wt 167.6 lb

## 2019-08-16 DIAGNOSIS — D1722 Benign lipomatous neoplasm of skin and subcutaneous tissue of left arm: Secondary | ICD-10-CM

## 2019-08-16 NOTE — Patient Instructions (Addendum)
Dr Hampton Abbot removed your drain today. He placed a dry gauze over the site. Please change the site with a dry gauze once a day until completely healed. You may take the dressing off when you shower. Try to take it easy with your Left arm for one more week.   We will see you in 2 weeks for a follow up with Thedore Mins our PA. Please see your appointment below.   Please call the office if you have any questions or concerns.

## 2019-08-16 NOTE — Progress Notes (Signed)
08/16/2019  HPI: Karla Carter is a 64 y.o. female s/p excision of left upper arm lipoma on 08/08/19.  Blake drain was left in place due to the size of the mass removed (13.8 x 8 x 5.5 cm).  She presents today for follow up.  She has been doing well, without any worsening pain or issues with the incision.  Sometimes she will have discomfort if she rolls onto her left arm when she sleeps, but otherwise doing well.  Her drain has been having < 30 ml output per day.  Vital signs: BP (!) 147/81   Pulse 92   Temp 97.9 F (36.6 C)   Resp 12   Ht 5\' 4"  (1.626 m)   Wt 167 lb 9.6 oz (76 kg)   SpO2 95%   BMI 28.77 kg/m    Physical Exam: Constitutional: No acute distress Extremity:  Left upper arm incision clean, dry, intact with DermaBond in place.  Some mild surrounding ecchymosis but no erythema or induration.  Blake drain with serosanguinous output.  Assessment/Plan: This is a 64 y.o. female s/p excision of large left upper arm lipoma.  --Reviewed pathology with patient.  Benign lipoma, no atypia or malignancy. --Drain removed today without issues.  Instructed patient to do dry gauze dressing change daily until drain site fully healed. --Follow up in 2 weeks for wound check.   Melvyn Neth, [facility]

## 2019-08-18 ENCOUNTER — Telehealth: Payer: Self-pay | Admitting: Pharmacist

## 2019-08-18 NOTE — Telephone Encounter (Signed)
08/18/2019 12:40:30 PM - Ruthe Mannan script faxed for refill  -- Elmer Picker - Friday, August 18, 2019 12:39 PM -- I faxed script for refill on Dulera 100/67mcg  to [facility].

## 2019-08-22 ENCOUNTER — Other Ambulatory Visit: Payer: Self-pay

## 2019-08-22 ENCOUNTER — Ambulatory Visit: Payer: Self-pay | Admitting: Licensed Clinical Social Worker

## 2019-08-22 DIAGNOSIS — Z8659 Personal history of other mental and behavioral disorders: Secondary | ICD-10-CM

## 2019-08-22 DIAGNOSIS — F331 Major depressive disorder, recurrent, moderate: Secondary | ICD-10-CM

## 2019-08-22 NOTE — BH Specialist Note (Signed)
Integrated Behavioral Health Follow Up Visit Via Phone  MRN: [id number] Name: [name]  Type of Service: [place] Interpretor:No. Interpretor Name and Language: not applicable.   SUBJECTIVE: Karla Carter is a 64 y.o. female accompanied by herself. Patient was referred by  Patient reports the following symptoms/concerns: She notes that she was able to get the lipoma growth removed from her arm and its still sore. She explains that she goes back on March 18th to see if her arm is healing. She notes that not much else has been happening. She notes that she has been crocheting, playing games on her phone, and watching her Roku. She notes that her insomnia has been off and on. She notes that she has been getting along a little bit better with her husband. She notes that mostly she has been at home because their income is limited at this point in time. She discussed frustration with situational stressors with the family. She notes that her allergies are bothering her as well. She denies suicidal and homicidal thoughts.  Duration of problem: ; Severity of problem: mild  OBJECTIVE: Mood: Euthymic and Affect: Appropriate Risk of harm to self or others: No plan to harm self or others  LIFE CONTEXT: Family and Social: see above. School/Work: see above. Self-Care: see above. Life Changes: see above.   GOALS ADDRESSED: Patient will: 1.  Reduce symptoms of: insomnia  2.  Increase knowledge and/or ability of: coping skills and self-management skills  3.  Demonstrate ability to: Increase healthy adjustment to current life circumstances  INTERVENTIONS: Interventions utilized:  Supportive Counseling was utilized by the clinician during today's follow up session. Clinician processed with the patient regarding how she has been doing since the last follow up session. Clinician discussed with the patient regarding how things went with her procedure and how her  arm is feeling. Clinician measured the patient's depression on a numerical scale. Clinician explained to the patient that she thinks her recent episodes of insomnia are due to allergies and healing from her recent surgery on her arm.  Standardized Assessments completed: PHQ 9  ASSESSMENT: Patient currently experiencing see above.   Patient may benefit from see above.  PLAN: 1. Follow up with behavioral health clinician on : three weeks or earlier if needed. 2. Behavioral recommendations: see above. 3. Referral(s): [place] (In Clinic) 4. "From scale of 1-10, how likely are you to follow plan?":   Bayard Hugger, LCSW

## 2019-08-31 ENCOUNTER — Encounter: Payer: Self-pay | Admitting: Physician Assistant

## 2019-09-06 ENCOUNTER — Encounter: Payer: Self-pay | Admitting: Physician Assistant

## 2019-09-06 ENCOUNTER — Telehealth: Payer: Self-pay | Admitting: Pharmacy Technician

## 2019-09-06 ENCOUNTER — Other Ambulatory Visit: Payer: Self-pay | Admitting: Gerontology

## 2019-09-06 ENCOUNTER — Other Ambulatory Visit: Payer: Self-pay

## 2019-09-06 ENCOUNTER — Ambulatory Visit (INDEPENDENT_AMBULATORY_CARE_PROVIDER_SITE_OTHER): Payer: Self-pay | Admitting: Physician Assistant

## 2019-09-06 VITALS — BP 145/81 | HR 83 | Temp 97.7°F | Resp 15 | Ht 64.0 in | Wt 165.8 lb

## 2019-09-06 DIAGNOSIS — D1722 Benign lipomatous neoplasm of skin and subcutaneous tissue of left arm: Secondary | ICD-10-CM

## 2019-09-06 DIAGNOSIS — E785 Hyperlipidemia, unspecified: Secondary | ICD-10-CM

## 2019-09-06 DIAGNOSIS — Z09 Encounter for follow-up examination after completed treatment for conditions other than malignant neoplasm: Secondary | ICD-10-CM

## 2019-09-06 NOTE — Telephone Encounter (Signed)
Received updated proof of income.  Patient eligible to receive medication assistance at [facility] until time for re-certification in 9359, and as long as eligibility requirements continue to be met.  [facility]

## 2019-09-06 NOTE — Progress Notes (Signed)
[facility] POST-OP OFFICE VISIT  09/06/2019  HPI: Karla Carter is a 64 y.o. female is ~1 month s/p excision of large left upper arm lipoma with Dr Hampton Abbot  Doing well Minimal soreness, controlled with ibuprofen No fever, chills, drainage No new complaints  Vital signs: BP (!) 145/81   Pulse 83   Temp 97.7 F (36.5 C)   Resp 15   Ht 5\' 4"  (1.626 m)   Wt 165 lb 12.8 oz (75.2 kg)   BMI 28.46 kg/m    Physical Exam: Constitutional: Well appearing female, NAD Skin: Well healing incision to the lateral upper left arm, no erythema, no drainage, mildly indurated, no swelling  Assessment/Plan: This is a 64 y.o. female ~1 month s/p excision of large left upper arm lipoma   - Reviewed wound care  - reviewed return precautions/sins of wound infection  - rtc prn  -- Edison Simon, PA-C [facility] 09/06/2019, 11:27 AM [phone] M-F: 7am - 4pm

## 2019-09-06 NOTE — Patient Instructions (Addendum)
May continue to use Ibuprofen as needed for discomfort.   Follow-up with our office as needed.  Please call and ask to speak with a nurse if you develop questions or concerns.

## 2019-09-12 ENCOUNTER — Encounter: Payer: Self-pay | Admitting: Licensed Clinical Social Worker

## 2019-09-12 ENCOUNTER — Other Ambulatory Visit: Payer: Self-pay

## 2019-09-12 ENCOUNTER — Ambulatory Visit: Payer: Self-pay | Admitting: Licensed Clinical Social Worker

## 2019-09-12 DIAGNOSIS — F331 Major depressive disorder, recurrent, moderate: Secondary | ICD-10-CM

## 2019-09-12 NOTE — BH Specialist Note (Signed)
Integrated Behavioral Health Follow Up Visit Via Phone  MRN: [id number] Name: KEANDRIA GUISTI  Type of Service: [place] Interpretor:No. Interpretor Name and Language: see above.  SUBJECTIVE: Karla Carter is a 64 y.o. female accompanied by herself. Patient was referred by self for mental health. Patient reports the following symptoms/concerns: She notes that she has been doing well. She notes that she is still having to take it easy with her arm but the doctor said it would just take some time to fully heal. She notes that she has been crocheting on her afghan off and on. She notes that she a few days where she felt like she could not do anything right. She notes that her sleep has been both good and bad. She notes that her son bought a new bed so she got his other bed and its a lot more comfortable. She explains that her appetite is hit or miss depending on what is prepared and who is cooking the meal. She discussed situational stressors with her family. She denies suicidal and homicidal thoughts.  Duration of problem: ; Severity of problem: mild  OBJECTIVE: Mood: Euthymic and Affect: Appropriate Risk of harm to self or others: No plan to harm self or others  LIFE CONTEXT: Family and Social: see above. School/Work: see above. Self-Care: see above. Life Changes: see above.  GOALS ADDRESSED: Patient will: 1.  Reduce symptoms of: insomnia  2.  Increase knowledge and/or ability of: self-management skills and stress reduction  3.  Demonstrate ability to: Increase healthy adjustment to current life circumstances  INTERVENTIONS: Interventions utilized:  Supportive Counseling was utilized by the clinician during today's session. Clinician processed with the patient regarding how she has been doing since the last follow up session. Clinician measured the patient's depression on a numerical scale. Clinician discussed with the patient regarding how things  have been going in terms of her sleep in the last few weeks since the knot was removed from her arm. Clinician encouraged the patient to practice self care and focus on things for enjoyment. Standardized Assessments completed: PHQ 9 & social determinant screening completed.   ASSESSMENT: Patient currently experiencing see above.   Patient may benefit from see above.  PLAN: 1. Follow up with behavioral health clinician on :  2. Behavioral recommendations: see above.  3. Referral(s): [place] (In Clinic) 4. "From scale of 1-10, how likely are you to follow plan?":   Bayard Hugger, LCSW

## 2019-09-14 ENCOUNTER — Other Ambulatory Visit: Payer: Self-pay

## 2019-09-14 DIAGNOSIS — R748 Abnormal levels of other serum enzymes: Secondary | ICD-10-CM

## 2019-09-14 DIAGNOSIS — E785 Hyperlipidemia, unspecified: Secondary | ICD-10-CM

## 2019-09-14 DIAGNOSIS — R7303 Prediabetes: Secondary | ICD-10-CM

## 2019-09-15 LAB — LIPID PANEL
Chol/HDL Ratio: 3.4 ratio (ref 0.0–4.4)
Cholesterol, Total: 148 mg/dL (ref 100–199)
HDL: 43 mg/dL (ref >39)
LDL Chol Calc (NIH): 78 mg/dL (ref 0–99)
Triglycerides: 157 mg/dL — ABNORMAL HIGH (ref 0–149)
VLDL Cholesterol Cal: 27 mg/dL (ref 5–40)

## 2019-09-15 LAB — COMPREHENSIVE METABOLIC PANEL
ALT: 73 IU/L — ABNORMAL HIGH (ref 0–32)
AST: 61 IU/L — ABNORMAL HIGH (ref 0–40)
Albumin/Globulin Ratio: 1.9 (ref 1.2–2.2)
Albumin: 4.6 g/dL (ref 3.8–4.8)
Alkaline Phosphatase: 123 IU/L — ABNORMAL HIGH (ref 39–117)
BUN/Creatinine Ratio: 17 (ref 12–28)
BUN: 15 mg/dL (ref 8–27)
Bilirubin Total: 0.4 mg/dL (ref 0.0–1.2)
CO2: 22 mmol/L (ref 20–29)
Calcium: 9.2 mg/dL (ref 8.7–10.3)
Chloride: 104 mmol/L (ref 96–106)
Creatinine, Ser: 0.88 mg/dL (ref 0.57–1.00)
GFR calc Af Amer: 81 mL/min/{1.73_m2} (ref >59)
GFR calc non Af Amer: 70 mL/min/{1.73_m2} (ref >59)
Globulin, Total: 2.4 g/dL (ref 1.5–4.5)
Glucose: 135 mg/dL — ABNORMAL HIGH (ref 65–99)
Potassium: 3.9 mmol/L (ref 3.5–5.2)
Sodium: 140 mmol/L (ref 134–144)
Total Protein: 7 g/dL (ref 6.0–8.5)

## 2019-09-15 LAB — HEMOGLOBIN A1C
Est. average glucose Bld gHb Est-mCnc: 146 mg/dL
Hgb A1c MFr Bld: 6.7 % — ABNORMAL HIGH (ref 4.8–5.6)

## 2019-09-21 ENCOUNTER — Other Ambulatory Visit: Payer: Self-pay

## 2019-09-21 ENCOUNTER — Encounter: Payer: Self-pay | Admitting: Gerontology

## 2019-09-21 ENCOUNTER — Ambulatory Visit: Payer: Self-pay | Admitting: Gerontology

## 2019-09-21 VITALS — BP 151/80 | HR 85 | Temp 98.0°F | Ht 64.0 in | Wt 167.0 lb

## 2019-09-21 DIAGNOSIS — E119 Type 2 diabetes mellitus without complications: Secondary | ICD-10-CM

## 2019-09-21 DIAGNOSIS — I10 Essential (primary) hypertension: Secondary | ICD-10-CM

## 2019-09-21 DIAGNOSIS — L989 Disorder of the skin and subcutaneous tissue, unspecified: Secondary | ICD-10-CM

## 2019-09-21 DIAGNOSIS — F17200 Nicotine dependence, unspecified, uncomplicated: Secondary | ICD-10-CM

## 2019-09-21 DIAGNOSIS — Z0000 Encounter for general adult medical examination without abnormal findings: Secondary | ICD-10-CM

## 2019-09-21 DIAGNOSIS — R748 Abnormal levels of other serum enzymes: Secondary | ICD-10-CM

## 2019-09-21 MED ORDER — GLIPIZIDE 5 MG PO TABS: 2.5000 mg | ORAL_TABLET | Freq: Every day | ORAL | 0 refills | Status: DC

## 2019-09-21 NOTE — Progress Notes (Signed)
Established Patient Office Visit  Subjective:  Patient ID: Karla Carter, female    DOB: 07/22/1955  Age: 64 y.o. MRN: [id number]  CC: No chief complaint on file.   HPI [name] presents for follow up of hypertension,and skin lesion to  lower leg. Her blood pressure during visit was 151/80 and she states that she forgot to take her blood pressure medication prior to visit. She continues to smoke 1/2 pack of cigarette daily and admits the desire to quit. Her HgbA1c done on 09/14/2019 and it increased from 6.1% to 6.7%. She states that she drinks 4 cans or 16 oz soda daily and does not drink water. She states that she checks her blood glucose 3 times weekly, and it was 161 mg/dl during visit. She denies hypo/hyperglycemic symptoms, and states that she performs daily foot check. Her AST is still the same at 61 and ALT decreased from 80 to 73. Her Alk Phos was normal previously but increased to 123. She is s/p excision of large left upper arm lipoma on 08/08/2019 by Dr Hampton Abbot, and her incision site has completely healed. She states that she will reschedule her Dermatology appointment with regards to the skin lesion to her left lower leg. Overall, she states that she's doing well and offers no further complaint.  Past Medical History:  Diagnosis Date  . Anxiety   . COPD (chronic obstructive pulmonary disease) ([place])   . Depression   . GERD (gastroesophageal reflux disease)   . Hypertension     Past Surgical History:  Procedure Laterality Date  . ABDOMINAL HYSTERECTOMY    . APPENDECTOMY    . COLON SURGERY    . LIPOMA EXCISION Left 08/08/2019   Procedure: EXCISION LIPOMA Left Arm;  Surgeon: Olean Ree, MD;  Location: [facility] ORS;  Service: General;  Laterality: Left;  . TONSILLECTOMY      Family History  Problem Relation Age of Onset  . Cancer Father   . Cancer Maternal Aunt     Social History   Socioeconomic History  . Marital status: Married    Spouse name: Not on file  .  Number of children: 2  . Years of education: Not on file  . Highest education level: Not on file  Occupational History  . Not on file  Tobacco Use  . Smoking status: Current Every Day Smoker    Packs/day: 0.75    Years: 48.00    Pack years: 36.00    Types: Cigarettes  . Smokeless tobacco: Never Used  Substance and Sexual Activity  . Alcohol use: No  . Drug use: Never  . Sexual activity: Not on file  Other Topics Concern  . Not on file  Social History Narrative   Social determinant screening completed. Patient is in stable housing, is on food stamps, social security, and retirement. Patient does not require a referral to [facility] at this point in time. Follows therapist at [facility] for mental health. HS   Social Determinants of Health   Financial Resource Strain: Low Risk   . Difficulty of Paying Living Expenses: Not very hard  Food Insecurity: No Food Insecurity  . Worried About Charity fundraiser in the Last Year: Never true  . Ran Out of Food in the Last Year: Never true  Transportation Needs: No Transportation Needs  . Lack of Transportation (Medical): No  . Lack of Transportation (Non-Medical): No  Physical Activity: Inactive  . Days of Exercise per Week: 0 days  .  Minutes of Exercise per Session: 0 min  Stress: Stress Concern Present  . Feeling of Stress : To some extent  Social Connections: Not Isolated  . Frequency of Communication with Friends and Family: More than three times a week  . Frequency of Social Gatherings with Friends and Family: More than three times a week  . Attends Religious Services: 1 to 4 times per year  . Active Member of Clubs or Organizations: Yes  . Attends Archivist Meetings: 1 to 4 times per year  . Marital Status: Married  Human resources officer Violence: Not At Risk  . Fear of Current or Ex-Partner: No  . Emotionally Abused: No  . Physically Abused: No  . Sexually Abused: No    Outpatient Medications Prior to Visit  Medication  Sig Dispense Refill  . albuterol (PROVENTIL HFA;VENTOLIN HFA) 108 (90 Base) MCG/ACT inhaler INHALE 2 PUFFS INTO THE LUNGS EVERY 4 HOURS AS NEEDED FOR WHEEZING ORSHORTNESS OF BREATH (Patient taking differently: Inhale 2 puffs into the lungs every 4 (four) hours as needed. ) 18 g 0  . amLODipine (NORVASC) 10 MG tablet Take 1 tablet (10 mg total) by mouth daily. 30 tablet 4  . Blood Pressure Monitor KIT 1 kit by Does not apply route daily. 1 kit 0  . diphenhydrAMINE (NIGHT TIME SLEEP AID) 25 MG tablet Take 25 mg by mouth at bedtime as needed for sleep.    . famotidine (PEPCID) 20 MG tablet Take 20 mg by mouth 2 (two) times daily.    Marland Kitchen MELATONIN PO Take 30 mg by mouth at bedtime as needed (Sleep).     . meloxicam (MOBIC) 7.5 MG tablet Take 1 tablet (7.5 mg total) by mouth daily. (Patient taking differently: Take 7.5 mg by mouth at bedtime. ) 30 tablet 2  . mometasone-formoterol (DULERA) 100-5 MCG/ACT AERO Inhale 2 puffs into the lungs 2 (two) times daily. (Patient taking differently: Inhale 2 puffs into the lungs daily as needed for shortness of breath. ) 1 Inhaler 3  . rosuvastatin (CRESTOR) 10 MG tablet TAKE ONE TABLET BY MOUTH EVERY DAY 60 tablet 0  . mirtazapine (REMERON) 15 MG tablet Take 1 tablet (15 mg total) by mouth at bedtime. 30 tablet 2  . sertraline (ZOLOFT) 50 MG tablet Take 3 tablets (150 mg total) by mouth daily. (Patient taking differently: Take 150 mg by mouth at bedtime. ) 90 tablet 2  . hydrocortisone cream 1 % Apply 1 application topically 2 (two) times daily. (Patient not taking: Reported on 09/21/2019) 30 g 0  . ibuprofen (ADVIL) 600 MG tablet Take 1 tablet (600 mg total) by mouth every 8 (eight) hours as needed for mild pain or moderate pain. (Patient not taking: Reported on 09/21/2019) 30 tablet 1   No facility-administered medications prior to visit.    Allergies  Allergen Reactions  . Metformin And Related   . Codeine Rash    ROS Review of Systems  Constitutional:  Negative.   Eyes: Negative.   Respiratory: Negative.   Cardiovascular: Negative.   Endocrine: Negative.   Skin:       Lesion lateral left lower leg  Neurological: Negative.   Psychiatric/Behavioral: Negative.       Objective:    Physical Exam  Constitutional: She is oriented to person, place, and time. She appears well-developed.  HENT:  Head: Normocephalic and atraumatic.  Eyes: Pupils are equal, round, and reactive to light. EOM are normal.  Cardiovascular: Normal rate and regular rhythm.  Pulmonary/Chest: Effort  normal and breath sounds normal.  Neurological: She is alert and oriented to person, place, and time.  Skin:  Lesion to left lower leg  Psychiatric: She has a normal mood and affect. Her behavior is normal. Judgment and thought content normal.    BP (!) 151/80   Pulse 85   Temp 98 F (36.7 C)   Ht 5' 4" (1.626 m)   Wt 167 lb (75.8 kg)   SpO2 98%   BMI 28.67 kg/m  Wt Readings from Last 3 Encounters:  09/21/19 167 lb (75.8 kg)  09/06/19 165 lb 12.8 oz (75.2 kg)  08/16/19 167 lb 9.6 oz (76 kg)     Health Maintenance Due  Topic Date Due  . OPHTHALMOLOGY EXAM  Never done  . HIV Screening  Never done  . TETANUS/TDAP  Never done  . PAP SMEAR-Modifier  Never done  . MAMMOGRAM  Never done  . COLONOSCOPY  Never done  . URINE MICROALBUMIN  07/06/2019    There are no preventive care reminders to display for this patient.  Lab Results  Component Value Date   TSH 1.640 04/06/2019   Lab Results  Component Value Date   WBC 12.9 (H) 04/06/2019   HGB 13.8 04/06/2019   HCT 39.8 04/06/2019   MCV 85 04/06/2019   PLT 237 04/06/2019   Lab Results  Component Value Date   NA 140 09/14/2019   K 3.9 09/14/2019   CO2 22 09/14/2019   GLUCOSE 135 (H) 09/14/2019   BUN 15 09/14/2019   CREATININE 0.88 09/14/2019   BILITOT 0.4 09/14/2019   ALKPHOS 123 (H) 09/14/2019   AST 61 (H) 09/14/2019   ALT 73 (H) 09/14/2019   PROT 7.0 09/14/2019   ALBUMIN 4.6 09/14/2019    CALCIUM 9.2 09/14/2019   ANIONGAP 10 08/03/2016   Lab Results  Component Value Date   CHOL 148 09/14/2019   Lab Results  Component Value Date   HDL 43 09/14/2019   Lab Results  Component Value Date   LDLCALC 78 09/14/2019   Lab Results  Component Value Date   TRIG 157 (H) 09/14/2019   Lab Results  Component Value Date   CHOLHDL 3.4 09/14/2019   Lab Results  Component Value Date   HGBA1C 6.7 (H) 09/14/2019      Assessment & Plan:    1. Essential hypertension - She will continue on current treatment regimen. - She was advised to continue on Low salt DASH diet -Take medications regularly on time -Exercise regularly as tolerated -Check blood pressure at least once a week at home or a nearby pharmacy and record -Goal is less than 150/90 and normal blood pressure is 120/80    2. Skin lesion of left lower extremity - She was advised to call and reschedule Dermatology appointment.  3. Elevated liver enzymes - Will recheck lab and possibly titrate Statin and GI referral.  4. Smoking - She was strongly encouraged on smoking cessation.  5. Type 2 diabetes mellitus without complication, without long-term current use of insulin (HCC) -  Her HgbA1c was 6.7%, and she was advised to decrease soda consumption and increase water intake. She didn't tolerate Metformin, will start glipizide. She was educated on medication side effect and was advised to notify clinic. She was advised to check fasting blood glucose daily, record and bring log to follow up appointment, her fasting reading should be between 80-130 mg/dl. She was advised to continue on low carb/non concentrated diet. - glipiZIDE (GLUCOTROL) 5 MG  tablet; Take 0.5 tablets (2.5 mg total) by mouth daily before breakfast.  Dispense: 30 tablet; Refill: 0  6. Health care maintenance  - Ambulatory referral to Gastroenterology for Colonoscopy screening - Ambulatory referral to Hematology / Oncology for  Mammogram.    Follow-up: Return in about 27 days (around 10/18/2019), or if symptoms worsen or fail to improve.    Nazire Fruth Jerold Coombe, NP

## 2019-09-21 NOTE — Patient Instructions (Signed)
Carbohydrate Counting for Diabetes Mellitus, Adult  Carbohydrate counting is a method of keeping track of how many carbohydrates you eat. Eating carbohydrates naturally increases the amount of sugar (glucose) in the blood. Counting how many carbohydrates you eat helps keep your blood glucose within normal limits, which helps you manage your diabetes (diabetes mellitus). It is important to know how many carbohydrates you can safely have in each meal. This is different for every person. A diet and nutrition specialist (registered dietitian) can help you make a meal plan and calculate how many carbohydrates you should have at each meal and snack. Carbohydrates are found in the following foods:  Grains, such as breads and cereals.  Dried beans and soy products.  Starchy vegetables, such as potatoes, peas, and corn.  Fruit and fruit juices.  Milk and yogurt.  Sweets and snack foods, such as cake, cookies, candy, chips, and soft drinks. How do I count carbohydrates? There are two ways to count carbohydrates in food. You can use either of the methods or a combination of both. Reading "Nutrition Facts" on packaged food The "Nutrition Facts" list is included on the labels of almost all packaged foods and beverages in the [place] It includes:  The serving size.  Information about nutrients in each serving, including the grams (g) of carbohydrate per serving. To use the "Nutrition Facts":  Decide how many servings you will have.  Multiply the number of servings by the number of carbohydrates per serving.  The resulting number is the total amount of carbohydrates that you will be having. Learning standard serving sizes of other foods When you eat carbohydrate foods that are not packaged or do not include "Nutrition Facts" on the label, you need to measure the servings in order to count the amount of carbohydrates:  Measure the foods that you will eat with a food scale or measuring cup, if needed.   Decide how many standard-size servings you will eat.  Multiply the number of servings by 15. Most carbohydrate-rich foods have about 15 g of carbohydrates per serving. ? For example, if you eat 8 oz (170 g) of strawberries, you will have eaten 2 servings and 30 g of carbohydrates (2 servings x 15 g = 30 g).  For foods that have more than one food mixed, such as soups and casseroles, you must count the carbohydrates in each food that is included. The following list contains standard serving sizes of common carbohydrate-rich foods. Each of these servings has about 15 g of carbohydrates:   hamburger bun or  English muffin.   oz (15 mL) syrup.   oz (14 g) jelly.  1 slice of bread.  1 six-inch tortilla.  3 oz (85 g) cooked rice or pasta.  4 oz (113 g) cooked dried beans.  4 oz (113 g) starchy vegetable, such as peas, corn, or potatoes.  4 oz (113 g) hot cereal.  4 oz (113 g) mashed potatoes or  of a large baked potato.  4 oz (113 g) canned or frozen fruit.  4 oz (120 mL) fruit juice.  4-6 crackers.  6 chicken nuggets.  6 oz (170 g) unsweetened dry cereal.  6 oz (170 g) plain fat-free yogurt or yogurt sweetened with artificial sweeteners.  8 oz (240 mL) milk.  8 oz (170 g) fresh fruit or one small piece of fruit.  24 oz (680 g) popped popcorn. Example of carbohydrate counting Sample meal  3 oz (85 g) chicken breast.  6 oz (170 g)   brown rice.  4 oz (113 g) corn.  8 oz (240 mL) milk.  8 oz (170 g) strawberries with sugar-free whipped topping. Carbohydrate calculation 1. Identify the foods that contain carbohydrates: ? Rice. ? Corn. ? Milk. ? Strawberries. 2. Calculate how many servings you have of each food: ? 2 servings rice. ? 1 serving corn. ? 1 serving milk. ? 1 serving strawberries. 3. Multiply each number of servings by 15 g: ? 2 servings rice x 15 g = 30 g. ? 1 serving corn x 15 g = 15 g. ? 1 serving milk x 15 g = 15 g. ? 1 serving  strawberries x 15 g = 15 g. 4. Add together all of the amounts to find the total grams of carbohydrates eaten: ? 30 g + 15 g + 15 g + 15 g = 75 g of carbohydrates total. Summary  Carbohydrate counting is a method of keeping track of how many carbohydrates you eat.  Eating carbohydrates naturally increases the amount of sugar (glucose) in the blood.  Counting how many carbohydrates you eat helps keep your blood glucose within normal limits, which helps you manage your diabetes.  A diet and nutrition specialist (registered dietitian) can help you make a meal plan and calculate how many carbohydrates you should have at each meal and snack. This information is not intended to replace advice given to you by your health care provider. Make sure you discuss any questions you have with your health care provider. Document Revised: 12/24/2016 Document Reviewed: 11/13/2015 Elsevier Patient Education  2020 Elsevier Inc. DASH Eating Plan DASH stands for "Dietary Approaches to Stop Hypertension." The DASH eating plan is a healthy eating plan that has been shown to reduce high blood pressure (hypertension). It may also reduce your risk for type 2 diabetes, heart disease, and stroke. The DASH eating plan may also help with weight loss. What are tips for following this plan?  General guidelines  Avoid eating more than 2,300 mg (milligrams) of salt (sodium) a day. If you have hypertension, you may need to reduce your sodium intake to 1,500 mg a day.  Limit alcohol intake to no more than 1 drink a day for nonpregnant women and 2 drinks a day for men. One drink equals 12 oz of beer, 5 oz of wine, or 1 oz of hard liquor.  Work with your health care provider to maintain a healthy body weight or to lose weight. Ask what an ideal weight is for you.  Get at least 30 minutes of exercise that causes your heart to beat faster (aerobic exercise) most days of the week. Activities may include walking, swimming, or  biking.  Work with your health care provider or diet and nutrition specialist (dietitian) to adjust your eating plan to your individual calorie needs. Reading food labels   Check food labels for the amount of sodium per serving. Choose foods with less than 5 percent of the Daily Value of sodium. Generally, foods with less than 300 mg of sodium per serving fit into this eating plan.  To find whole grains, look for the word "whole" as the first word in the ingredient list. Shopping  Buy products labeled as "low-sodium" or "no salt added."  Buy fresh foods. Avoid canned foods and premade or frozen meals. Cooking  Avoid adding salt when cooking. Use salt-free seasonings or herbs instead of table salt or sea salt. Check with your health care provider or pharmacist before using salt substitutes.  Do not   fry foods. Cook foods using healthy methods such as baking, boiling, grilling, and broiling instead.  Cook with heart-healthy oils, such as olive, canola, soybean, or sunflower oil. Meal planning  Eat a balanced diet that includes: ? 5 or more servings of fruits and vegetables each day. At each meal, try to fill half of your plate with fruits and vegetables. ? Up to 6-8 servings of whole grains each day. ? Less than 6 oz of lean meat, poultry, or fish each day. A 3-oz serving of meat is about the same size as a deck of cards. One egg equals 1 oz. ? 2 servings of low-fat dairy each day. ? A serving of nuts, seeds, or beans 5 times each week. ? Heart-healthy fats. Healthy fats called Omega-3 fatty acids are found in foods such as flaxseeds and coldwater fish, like sardines, salmon, and mackerel.  Limit how much you eat of the following: ? Canned or prepackaged foods. ? Food that is high in trans fat, such as fried foods. ? Food that is high in saturated fat, such as fatty meat. ? Sweets, desserts, sugary drinks, and other foods with added sugar. ? Full-fat dairy products.  Do not salt  foods before eating.  Try to eat at least 2 vegetarian meals each week.  Eat more home-cooked food and less restaurant, buffet, and fast food.  When eating at a restaurant, ask that your food be prepared with less salt or no salt, if possible. What foods are recommended? The items listed may not be a complete list. Talk with your dietitian about what dietary choices are best for you. Grains Whole-grain or whole-wheat bread. Whole-grain or whole-wheat pasta. Brown rice. Oatmeal. Quinoa. Bulgur. Whole-grain and low-sodium cereals. Pita bread. Low-fat, low-sodium crackers. Whole-wheat flour tortillas. Vegetables Fresh or frozen vegetables (raw, steamed, roasted, or grilled). Low-sodium or reduced-sodium tomato and vegetable juice. Low-sodium or reduced-sodium tomato sauce and tomato paste. Low-sodium or reduced-sodium canned vegetables. Fruits All fresh, dried, or frozen fruit. Canned fruit in natural juice (without added sugar). Meat and other protein foods Skinless chicken or turkey. Ground chicken or turkey. Pork with fat trimmed off. Fish and seafood. Egg whites. Dried beans, peas, or lentils. Unsalted nuts, nut butters, and seeds. Unsalted canned beans. Lean cuts of beef with fat trimmed off. Low-sodium, lean deli meat. Dairy Low-fat (1%) or fat-free (skim) milk. Fat-free, low-fat, or reduced-fat cheeses. Nonfat, low-sodium ricotta or cottage cheese. Low-fat or nonfat yogurt. Low-fat, low-sodium cheese. Fats and oils Soft margarine without trans fats. Vegetable oil. Low-fat, reduced-fat, or light mayonnaise and salad dressings (reduced-sodium). Canola, safflower, olive, soybean, and sunflower oils. Avocado. Seasoning and other foods Herbs. Spices. Seasoning mixes without salt. Unsalted popcorn and pretzels. Fat-free sweets. What foods are not recommended? The items listed may not be a complete list. Talk with your dietitian about what dietary choices are best for you. Grains Baked goods  made with fat, such as croissants, muffins, or some breads. Dry pasta or rice meal packs. Vegetables Creamed or fried vegetables. Vegetables in a cheese sauce. Regular canned vegetables (not low-sodium or reduced-sodium). Regular canned tomato sauce and paste (not low-sodium or reduced-sodium). Regular tomato and vegetable juice (not low-sodium or reduced-sodium). Pickles. Olives. Fruits Canned fruit in a light or heavy syrup. Fried fruit. Fruit in cream or butter sauce. Meat and other protein foods Fatty cuts of meat. Ribs. Fried meat. Bacon. Sausage. Bologna and other processed lunch meats. Salami. Fatback. Hotdogs. Bratwurst. Salted nuts and seeds. Canned beans with added salt.   Canned or smoked fish. Whole eggs or egg yolks. Chicken or turkey with skin. Dairy Whole or 2% milk, cream, and half-and-half. Whole or full-fat cream cheese. Whole-fat or sweetened yogurt. Full-fat cheese. Nondairy creamers. Whipped toppings. Processed cheese and cheese spreads. Fats and oils Butter. Stick margarine. Lard. Shortening. Ghee. Bacon fat. Tropical oils, such as coconut, palm kernel, or palm oil. Seasoning and other foods Salted popcorn and pretzels. Onion salt, garlic salt, seasoned salt, table salt, and sea salt. Worcestershire sauce. Tartar sauce. Barbecue sauce. Teriyaki sauce. Soy sauce, including reduced-sodium. Steak sauce. Canned and packaged gravies. Fish sauce. Oyster sauce. Cocktail sauce. Horseradish that you find on the shelf. Ketchup. Mustard. Meat flavorings and tenderizers. Bouillon cubes. Hot sauce and Tabasco sauce. Premade or packaged marinades. Premade or packaged taco seasonings. Relishes. Regular salad dressings. Where to find more information:  National Heart, Lung, and Blood Institute: www.nhlbi.nih.gov  American Heart Association: www.heart.org Summary  The DASH eating plan is a healthy eating plan that has been shown to reduce high blood pressure (hypertension). It may also reduce  your risk for type 2 diabetes, heart disease, and stroke.  With the DASH eating plan, you should limit salt (sodium) intake to 2,300 mg a day. If you have hypertension, you may need to reduce your sodium intake to 1,500 mg a day.  When on the DASH eating plan, aim to eat more fresh fruits and vegetables, whole grains, lean proteins, low-fat dairy, and heart-healthy fats.  Work with your health care provider or diet and nutrition specialist (dietitian) to adjust your eating plan to your individual calorie needs. This information is not intended to replace advice given to you by your health care provider. Make sure you discuss any questions you have with your health care provider. Document Revised: 05/14/2017 Document Reviewed: 05/25/2016 Elsevier Patient Education  2020 Elsevier Inc.  

## 2019-10-03 ENCOUNTER — Ambulatory Visit: Payer: Self-pay | Admitting: Licensed Clinical Social Worker

## 2019-10-03 ENCOUNTER — Other Ambulatory Visit: Payer: Self-pay

## 2019-10-03 DIAGNOSIS — F331 Major depressive disorder, recurrent, moderate: Secondary | ICD-10-CM

## 2019-10-03 NOTE — BH Specialist Note (Signed)
Integrated Behavioral Health Follow Up Visit Via Phone  MRN: [id number] Name: Karla Carter  Type of Service: [place] Interpretor:No. Interpretor Name and Language: not applicable.   SUBJECTIVE: Karla Carter is a 64 y.o. female accompanied by herself. Patient was referred by self for mental health. Patient reports the following symptoms/concerns: She discussed situational stressors with her family that occurred in the last few weeks that upset her. She explains that she has noticed that she becomes anxious when she has to leave the house to drive somewhere or ride in the car and having to go to the store. She notes that she is claustrophobic and its hard to go in places with a lot of people. She notes that despite burning off energy and taking her medications that there are some nights that she cannot sleep. She notes that other than those stressors that she has been doing okay. She reports that she had a little bit of extra money with her stimulus so she ordered herself some things online to treat herself. She explains that she is still crocheting. She denies suicidal and homicidal thoughts.  Duration of problem: ; Severity of problem: mild  OBJECTIVE: Mood: Euthymic and Affect: Appropriate Risk of harm to self or others: No plan to harm self or others  LIFE CONTEXT: Family and Social: see above. School/Work: see above. Self-Care: see above. Life Changes: see above.  GOALS ADDRESSED: Patient will: 1.  Reduce symptoms of: stress  2.  Increase knowledge and/or ability of: coping skills and self-management skills  3.  Demonstrate ability to: Increase healthy adjustment to current life circumstances  INTERVENTIONS: Interventions utilized:  Supportive Counseling was utilized by the clinician during today's follow up session. Clinician processed with the patient regarding how she has been doing since the last follow up session. Clinician measured  the patient's anxiety and depression on a numerical scale. Clinician asked the patient if she experienced any symptoms of anxiety prior to the pandemic when she had to go to the store, drive, or ride in the car. Clinician explained to the patient that it sounds like the times of the day that she is choosing to shop at a super store are quite crowded and that's what's causing her to feel overwhelmed. Clinician suggested that patient choose a different time to shop at the store. Clinician explained to the patient that regardless of medication that she may have the occasional night that she is unable to sleep. Clinician suggested that the patient practice the sleep hygiene techniques discussed at previous follow up sessions.  Standardized Assessments completed: PHQ 9  ASSESSMENT: Patient currently experiencing see above.   Patient may benefit from see above.  PLAN: 1. Follow up with behavioral health clinician on :  2. Behavioral recommendations: see above. 3. Referral(s): [place] (In Clinic) 4. "From scale of 1-10, how likely are you to follow plan?":   Bayard Hugger, LCSW

## 2019-10-12 ENCOUNTER — Telehealth: Payer: Self-pay

## 2019-10-12 NOTE — Telephone Encounter (Signed)
Received inbasket message from volunteer stating patient called requesting refill on meloxicam.  Per message, patient was instructed to call [facility] for refill.  Informed patient she would need to see provider for refill and that she has an upcoming appointment on 10/24/19 at [facility] to see if earlier appointment available, though patient declined, stated she would wait.

## 2019-10-24 ENCOUNTER — Other Ambulatory Visit: Payer: Self-pay

## 2019-10-24 ENCOUNTER — Other Ambulatory Visit: Payer: Self-pay | Admitting: Gerontology

## 2019-10-24 ENCOUNTER — Ambulatory Visit: Payer: Self-pay | Admitting: Gerontology

## 2019-10-24 DIAGNOSIS — E119 Type 2 diabetes mellitus without complications: Secondary | ICD-10-CM

## 2019-10-24 DIAGNOSIS — Z8709 Personal history of other diseases of the respiratory system: Secondary | ICD-10-CM

## 2019-10-24 DIAGNOSIS — M25561 Pain in right knee: Secondary | ICD-10-CM

## 2019-10-24 MED ORDER — MELOXICAM 7.5 MG PO TABS: 7.5000 mg | ORAL_TABLET | Freq: Every day | ORAL | 0 refills | Status: DC

## 2019-10-24 MED ORDER — BLOOD GLUCOSE MONITOR KIT: PACK | 0 refills | Status: AC

## 2019-10-24 MED ORDER — GLIPIZIDE 5 MG PO TABS: 2.5000 mg | ORAL_TABLET | Freq: Every evening | ORAL | 2 refills | Status: DC

## 2019-10-24 MED ORDER — FLUTICASONE-SALMETEROL 250-50 MCG/DOSE IN AEPB: 1.0000 | INHALATION_SPRAY | Freq: Every day | RESPIRATORY_TRACT | 1 refills | Status: DC

## 2019-10-24 NOTE — Patient Instructions (Signed)
Carbohydrate Counting for Diabetes Mellitus, Adult  Carbohydrate counting is a method of keeping track of how many carbohydrates you eat. Eating carbohydrates naturally increases the amount of sugar (glucose) in the blood. Counting how many carbohydrates you eat helps keep your blood glucose within normal limits, which helps you manage your diabetes (diabetes mellitus). It is important to know how many carbohydrates you can safely have in each meal. This is different for every person. A diet and nutrition specialist (registered dietitian) can help you make a meal plan and calculate how many carbohydrates you should have at each meal and snack. Carbohydrates are found in the following foods:  Grains, such as breads and cereals.  Dried beans and soy products.  Starchy vegetables, such as potatoes, peas, and corn.  Fruit and fruit juices.  Milk and yogurt.  Sweets and snack foods, such as cake, cookies, candy, chips, and soft drinks. How do I count carbohydrates? There are two ways to count carbohydrates in food. You can use either of the methods or a combination of both. Reading "Nutrition Facts" on packaged food The "Nutrition Facts" list is included on the labels of almost all packaged foods and beverages in the [place] It includes:  The serving size.  Information about nutrients in each serving, including the grams (g) of carbohydrate per serving. To use the "Nutrition Facts":  Decide how many servings you will have.  Multiply the number of servings by the number of carbohydrates per serving.  The resulting number is the total amount of carbohydrates that you will be having. Learning standard serving sizes of other foods When you eat carbohydrate foods that are not packaged or do not include "Nutrition Facts" on the label, you need to measure the servings in order to count the amount of carbohydrates:  Measure the foods that you will eat with a food scale or measuring cup, if  needed.  Decide how many standard-size servings you will eat.  Multiply the number of servings by 15. Most carbohydrate-rich foods have about 15 g of carbohydrates per serving. ? For example, if you eat 8 oz (170 g) of strawberries, you will have eaten 2 servings and 30 g of carbohydrates (2 servings x 15 g = 30 g).  For foods that have more than one food mixed, such as soups and casseroles, you must count the carbohydrates in each food that is included. The following list contains standard serving sizes of common carbohydrate-rich foods. Each of these servings has about 15 g of carbohydrates:   hamburger bun or  English muffin.   oz (15 mL) syrup.   oz (14 g) jelly.  1 slice of bread.  1 six-inch tortilla.  3 oz (85 g) cooked rice or pasta.  4 oz (113 g) cooked dried beans.  4 oz (113 g) starchy vegetable, such as peas, corn, or potatoes.  4 oz (113 g) hot cereal.  4 oz (113 g) mashed potatoes or  of a large baked potato.  4 oz (113 g) canned or frozen fruit.  4 oz (120 mL) fruit juice.  4-6 crackers.  6 chicken nuggets.  6 oz (170 g) unsweetened dry cereal.  6 oz (170 g) plain fat-free yogurt or yogurt sweetened with artificial sweeteners.  8 oz (240 mL) milk.  8 oz (170 g) fresh fruit or one small piece of fruit.  24 oz (680 g) popped popcorn. Example of carbohydrate counting Sample meal  3 oz (85 g) chicken breast.  6 oz (170 g)   brown rice.  4 oz (113 g) corn.  8 oz (240 mL) milk.  8 oz (170 g) strawberries with sugar-free whipped topping. Carbohydrate calculation 1. Identify the foods that contain carbohydrates: ? Rice. ? Corn. ? Milk. ? Strawberries. 2. Calculate how many servings you have of each food: ? 2 servings rice. ? 1 serving corn. ? 1 serving milk. ? 1 serving strawberries. 3. Multiply each number of servings by 15 g: ? 2 servings rice x 15 g = 30 g. ? 1 serving corn x 15 g = 15 g. ? 1 serving milk x 15 g = 15 g. ? 1  serving strawberries x 15 g = 15 g. 4. Add together all of the amounts to find the total grams of carbohydrates eaten: ? 30 g + 15 g + 15 g + 15 g = 75 g of carbohydrates total. Summary  Carbohydrate counting is a method of keeping track of how many carbohydrates you eat.  Eating carbohydrates naturally increases the amount of sugar (glucose) in the blood.  Counting how many carbohydrates you eat helps keep your blood glucose within normal limits, which helps you manage your diabetes.  A diet and nutrition specialist (registered dietitian) can help you make a meal plan and calculate how many carbohydrates you should have at each meal and snack. This information is not intended to replace advice given to you by your health care provider. Make sure you discuss any questions you have with your health care provider. Document Revised: 12/24/2016 Document Reviewed: 11/13/2015 Elsevier Patient Education  2020 Elsevier Inc.  

## 2019-10-24 NOTE — Progress Notes (Signed)
Established Patient Office Visit  Subjective:  Patient ID: Karla Carter, female    DOB: 10-Mar-1956  Age: 64 y.o. MRN: [id number]  CC:  Chief Complaint  Patient presents with  . Hypertension   Patient consents to telephone visit and 2 patients identifiers was used to identify patient.  HPI Karla Carter presents for follow up of Type 2 diabetes and was started on 2.5 mg glipizide since she didn't tolerate Metformin. Her  HgbA1c done on 09/14/2019 was 6.7%. She states that she checks her fasting blood glucose once a week because she doesn't have a glucometer, and her readings are usually less than 130 mg/dl. She states that she has increased her water intake and decreased drinking soda to 1-2 sixteen oz bottle daily. She denies hypoglycemic/hyperglycemic symptoms, peripheral neuropathy and performs daily foot checks. Overall, she states that she's doing well and offers no further complaint.  Past Medical History:  Diagnosis Date  . Anxiety   . COPD (chronic obstructive pulmonary disease) ([place])   . Depression   . GERD (gastroesophageal reflux disease)   . Hypertension     Past Surgical History:  Procedure Laterality Date  . ABDOMINAL HYSTERECTOMY    . APPENDECTOMY    . COLON SURGERY    . LIPOMA EXCISION Left 08/08/2019   Procedure: EXCISION LIPOMA Left Arm;  Surgeon: Olean Ree, MD;  Location: [facility] ORS;  Service: General;  Laterality: Left;  . TONSILLECTOMY      Family History  Problem Relation Age of Onset  . Cancer Father   . Cancer Maternal Aunt     Social History   Socioeconomic History  . Marital status: Married    Spouse name: Not on file  . Number of children: 2  . Years of education: Not on file  . Highest education level: Not on file  Occupational History  . Not on file  Tobacco Use  . Smoking status: Current Every Day Smoker    Packs/day: 0.75    Years: 48.00    Pack years: 36.00    Types: Cigarettes  . Smokeless tobacco: Never Used  Substance  and Sexual Activity  . Alcohol use: No  . Drug use: Never  . Sexual activity: Not on file  Other Topics Concern  . Not on file  Social History Narrative   Social determinant screening completed. Patient is in stable housing, is on food stamps, social security, and retirement. Patient does not require a referral to [facility] at this point in time. Follows therapist at [facility] for mental health. HS   Social Determinants of Health   Financial Resource Strain: Low Risk   . Difficulty of Paying Living Expenses: Not very hard  Food Insecurity: No Food Insecurity  . Worried About Charity fundraiser in the Last Year: Never true  . Ran Out of Food in the Last Year: Never true  Transportation Needs: No Transportation Needs  . Lack of Transportation (Medical): No  . Lack of Transportation (Non-Medical): No  Physical Activity: Inactive  . Days of Exercise per Week: 0 days  . Minutes of Exercise per Session: 0 min  Stress: Stress Concern Present  . Feeling of Stress : To some extent  Social Connections: Not Isolated  . Frequency of Communication with Friends and Family: More than three times a week  . Frequency of Social Gatherings with Friends and Family: More than three times a week  . Attends Religious Services: 1 to 4 times per year  .  Active Member of Clubs or Organizations: Yes  . Attends Archivist Meetings: 1 to 4 times per year  . Marital Status: Married  Human resources officer Violence: Not At Risk  . Fear of Current or Ex-Partner: No  . Emotionally Abused: No  . Physically Abused: No  . Sexually Abused: No    Outpatient Medications Prior to Visit  Medication Sig Dispense Refill  . albuterol (PROVENTIL HFA;VENTOLIN HFA) 108 (90 Base) MCG/ACT inhaler INHALE 2 PUFFS INTO THE LUNGS EVERY 4 HOURS AS NEEDED FOR WHEEZING ORSHORTNESS OF BREATH (Patient taking differently: Inhale 2 puffs into the lungs every 4 (four) hours as needed. ) 18 g 0  . amLODipine (NORVASC) 10 MG tablet  Take 1 tablet (10 mg total) by mouth daily. 30 tablet 4  . famotidine (PEPCID) 20 MG tablet Take 20 mg by mouth 2 (two) times daily.    Marland Kitchen MELATONIN PO Take 30 mg by mouth at bedtime as needed (Sleep).     . mirtazapine (REMERON) 15 MG tablet Take 1 tablet (15 mg total) by mouth at bedtime. 30 tablet 2  . mometasone-formoterol (DULERA) 100-5 MCG/ACT AERO Inhale 2 puffs into the lungs 2 (two) times daily. (Patient taking differently: Inhale 2 puffs into the lungs daily as needed for shortness of breath. ) 1 Inhaler 3  . rosuvastatin (CRESTOR) 10 MG tablet TAKE ONE TABLET BY MOUTH EVERY DAY 60 tablet 0  . sertraline (ZOLOFT) 50 MG tablet Take 3 tablets (150 mg total) by mouth daily. (Patient taking differently: Take 150 mg by mouth at bedtime. ) 90 tablet 2  . glipiZIDE (GLUCOTROL) 5 MG tablet Take 0.5 tablets (2.5 mg total) by mouth daily before breakfast. (Patient taking differently: Take 2.5 mg by mouth at bedtime. ) 30 tablet 0  . meloxicam (MOBIC) 7.5 MG tablet Take 1 tablet (7.5 mg total) by mouth daily. (Patient taking differently: Take 7.5 mg by mouth at bedtime. ) 30 tablet 2  . Blood Pressure Monitor KIT 1 kit by Does not apply route daily. 1 kit 0  . diphenhydrAMINE (NIGHT TIME SLEEP AID) 25 MG tablet Take 25 mg by mouth at bedtime as needed for sleep.     No facility-administered medications prior to visit.    Allergies  Allergen Reactions  . Metformin And Related   . Codeine Rash    ROS Review of Systems  Constitutional: Negative.   Eyes: Negative.   Respiratory: Negative.   Cardiovascular: Negative.   Endocrine: Negative.   Neurological: Negative.       Objective:    Physical Exam No physical exam was done There were no vitals taken for this visit. Wt Readings from Last 3 Encounters:  09/21/19 167 lb (75.8 kg)  09/06/19 165 lb 12.8 oz (75.2 kg)  08/16/19 167 lb 9.6 oz (76 kg)     Health Maintenance Due  Topic Date Due  . OPHTHALMOLOGY EXAM  Never done  . HIV  Screening  Never done  . COVID-19 Vaccine (1) Never done  . TETANUS/TDAP  Never done  . PAP SMEAR-Modifier  Never done  . MAMMOGRAM  Never done  . COLONOSCOPY  Never done  . URINE MICROALBUMIN  07/06/2019    There are no preventive care reminders to display for this patient.  Lab Results  Component Value Date   TSH 1.640 04/06/2019   Lab Results  Component Value Date   WBC 12.9 (H) 04/06/2019   HGB 13.8 04/06/2019   HCT 39.8 04/06/2019   MCV  85 04/06/2019   PLT 237 04/06/2019   Lab Results  Component Value Date   NA 140 09/14/2019   K 3.9 09/14/2019   CO2 22 09/14/2019   GLUCOSE 135 (H) 09/14/2019   BUN 15 09/14/2019   CREATININE 0.88 09/14/2019   BILITOT 0.4 09/14/2019   ALKPHOS 123 (H) 09/14/2019   AST 61 (H) 09/14/2019   ALT 73 (H) 09/14/2019   PROT 7.0 09/14/2019   ALBUMIN 4.6 09/14/2019   CALCIUM 9.2 09/14/2019   ANIONGAP 10 08/03/2016   Lab Results  Component Value Date   CHOL 148 09/14/2019   Lab Results  Component Value Date   HDL 43 09/14/2019   Lab Results  Component Value Date   LDLCALC 78 09/14/2019   Lab Results  Component Value Date   TRIG 157 (H) 09/14/2019   Lab Results  Component Value Date   CHOLHDL 3.4 09/14/2019   Lab Results  Component Value Date   HGBA1C 6.7 (H) 09/14/2019      Assessment & Plan:     1. Type 2 diabetes mellitus without complication, without long-term current use of insulin (HCC) - Her HgbA1c was 6.7%, she was advised to pick up glucometer from clinic and check her fasting blood glucose daily and her goal should be between 80-130 mg/dl. She will continue on current medication, low carb/non concentrated sweet diet and exercise as tolerated. - blood glucose meter kit and supplies KIT; Dispense based on patient and insurance preference. Use up to four times daily as directed. (FOR ICD-9 250.00, 250.01).  Dispense: 1 each; Refill: 0 - HgB A1c; Future - Comp Met (CMET); Future   Urine Microalbumin w/creat.  ratio; Future - glipiZIDE (GLUCOTROL) 5 MG tablet; Take 0.5 tablets (2.5 mg total) by mouth at bedtime.  Dispense: 30 tablet; Refill: 2 -2. Acute pain of right knee -She will continue on current treatment regimen. - meloxicam (MOBIC) 7.5 MG tablet; Take 1 tablet (7.5 mg total) by mouth at bedtime.  Dispense: 30 tablet; Refill: 0   Follow-up: Return in about 2 months (around 12/28/2019), or if symptoms worsen or fail to improve.    Leo Fray Jerold Coombe, NP

## 2019-10-26 ENCOUNTER — Other Ambulatory Visit: Payer: Self-pay

## 2019-10-26 ENCOUNTER — Ambulatory Visit: Payer: Self-pay | Admitting: Licensed Clinical Social Worker

## 2019-10-26 DIAGNOSIS — F331 Major depressive disorder, recurrent, moderate: Secondary | ICD-10-CM

## 2019-10-26 DIAGNOSIS — Z8659 Personal history of other mental and behavioral disorders: Secondary | ICD-10-CM

## 2019-10-26 MED ORDER — SERTRALINE HCL 50 MG PO TABS: 150.0000 mg | ORAL_TABLET | Freq: Every day | ORAL | 2 refills | Status: DC

## 2019-10-26 MED ORDER — MIRTAZAPINE 15 MG PO TABS: 15.0000 mg | ORAL_TABLET | Freq: Every day | ORAL | 2 refills | Status: DC

## 2019-10-26 NOTE — BH Specialist Note (Signed)
Integrated Behavioral Health Follow Up Visit Via Phone  MRN: [id number] Name: Karla Carter   Type of Service: [place] Interpretor:No. Interpretor Name and Language: not applicable.   SUBJECTIVE: Karla Carter is a 64 y.o. female accompanied by herself. Patient was referred by self for mental health.  Patient reports the following symptoms/concerns: She notes that sleep is hit or miss. She reports that she will take Melatonin 10 mg at bedtime every night. She notes that her mood has been up and down. She discussed situational stressors with her husband and the household. She notes that she is trying to smoke less than half a pack a day. She explains that she rolls her own. She notes that she has thought about going back to church and getting the vaccine. She denies suicidal and homicidal thoughts.  Duration of problem:  Severity of problem: mild  OBJECTIVE: Mood: Euthymic and Affect: Appropriate Risk of harm to self or others: No plan to harm self or others  LIFE CONTEXT: Family and Social: see above. School/Work: see above.  Self-Care: see above. Life Changes: see above.   GOALS ADDRESSED: Patient will: 1.  Reduce symptoms of: depression  2.  Increase knowledge and/or ability of: healthy habits and self-management skills  3.  Demonstrate ability to: Increase healthy adjustment to current life circumstances  INTERVENTIONS: Interventions utilized:  Supportive Counseling was utilized by the clinician during today's follow up session. Clinician processed with the patient regarding how she has been doing since the last follow up session. Clinician measured the patient's depression on a numerical scale. Clinician explained to the patient that she thinks having the option of being able to go back to church would be a great idea for her to get out of the house other than just going to the store, the pharmacy, or [facility]. Clinician  encouraged the patient to focus on what is in her control versus what is out of her control.  Standardized Assessments completed: GAD-7 and PHQ 9  ASSESSMENT: Patient currently experiencing see above.   Patient may benefit from see above.   PLAN: 1. Follow up with behavioral health clinician on :  2. Behavioral recommendations: see above.  3. Referral(s): [place] (In Clinic) 4. "From scale of 1-10, how likely are you to follow plan?":   Bayard Hugger, LCSW

## 2019-11-01 ENCOUNTER — Telehealth: Payer: Self-pay | Admitting: Pharmacist

## 2019-11-01 NOTE — Telephone Encounter (Signed)
11/01/2019 9:48:06 AM - Advair & Ventolin pending  -- Karla Carter - Wednesday, Nov 01, 2019 9:45 AM --I have received the signed script back from provider for New Med-Advair 250/50--holding due to [place] renewal application has been sent to patient to sign & return with financial for renewal. Must receive back from patient before submitting to [place] for renewal.   Patient has been [place]. till 08/13/2020.

## 2019-11-16 ENCOUNTER — Ambulatory Visit: Payer: Self-pay | Admitting: Licensed Clinical Social Worker

## 2019-11-16 ENCOUNTER — Other Ambulatory Visit: Payer: Self-pay

## 2019-11-16 DIAGNOSIS — F331 Major depressive disorder, recurrent, moderate: Secondary | ICD-10-CM

## 2019-11-16 NOTE — BH Specialist Note (Signed)
Integrated Behavioral Health Follow Up Visit Via Phone  MRN: [id number] Name: Karla Carter  Type of Service: [place] Interpretor:No. Interpretor Name and Language: not applicable.   SUBJECTIVE: Karla Carter is a 64 y.o. female accompanied by herself. Patient was referred by self for mental health Patient reports the following symptoms/concerns: She discussed situational stressors with her family. She discussed how her youngest son made a suicide attempt by heroin a few days ago and found him. She notes that her son is in the hospital for the time being and is hoping that put him in the psych unit. She describes having nightmares about finding her youngest son dead. She notes that she prays for her youngest son constantly. She notes that she is looking forward to her family going out of town to [place] for a week so she can have peace and quiet. She denies suicidal and homicidal thoughts.  Duration of problem: ; Severity of problem: moderate  OBJECTIVE: Mood: Euthymic and Affect: Appropriate Risk of harm to self or others: No plan to harm self or others  LIFE CONTEXT: Family and Social: see above. School/Work: see above. Self-Care: see above. Life Changes: see above.  GOALS ADDRESSED: Patient will: 1.  Reduce symptoms of: depression  2.  Increase knowledge and/or ability of: self-management skills  3.  Demonstrate ability to: Increase healthy adjustment to current life circumstances  INTERVENTIONS: Interventions utilized:  Supportive Counseling was utilized by the clinician during today's follow up session. Clinician processed with the patient regarding how she has been doing since the last follow up session. Clinician measured the patient's depression on a numerical scale. Clinician utilized reflective listening encouraging the patient ventilate her feelings towards her current situation. Clinician encouraged the patient to focus on the  positives rather than the negatives and practice self care.  Standardized Assessments completed: PHQ 9  ASSESSMENT: Patient currently experiencing see above.   Patient may benefit from see above.  PLAN: 1. Follow up with behavioral health clinician on :  2. Behavioral recommendations: see above. 3. Referral(s): [place] (In Clinic) 4. "From scale of 1-10, how likely are you to follow plan?":   Bayard Hugger, LCSW

## 2019-11-28 ENCOUNTER — Other Ambulatory Visit: Payer: Self-pay

## 2019-11-28 DIAGNOSIS — M25561 Pain in right knee: Secondary | ICD-10-CM

## 2019-11-28 MED ORDER — MELOXICAM 7.5 MG PO TABS: 7.5000 mg | ORAL_TABLET | Freq: Every day | ORAL | 0 refills | Status: DC

## 2019-11-29 ENCOUNTER — Other Ambulatory Visit: Payer: Self-pay | Admitting: Gerontology

## 2019-11-29 DIAGNOSIS — M25561 Pain in right knee: Secondary | ICD-10-CM

## 2019-11-30 ENCOUNTER — Ambulatory Visit: Payer: Self-pay | Admitting: Licensed Clinical Social Worker

## 2019-11-30 ENCOUNTER — Other Ambulatory Visit: Payer: Self-pay

## 2019-11-30 DIAGNOSIS — F331 Major depressive disorder, recurrent, moderate: Secondary | ICD-10-CM

## 2019-11-30 NOTE — BH Specialist Note (Signed)
Integrated Behavioral Health Follow Up Visit Via Phone  MRN: [id number] Name: Karla Carter   Type of Service: [place] Interpretor:No. Interpretor Name and Language: not applicable.   SUBJECTIVE: Karla Carter is a 64 y.o. female accompanied by herself. Patient was referred by self for mental health. Patient reports the following symptoms/concerns: She notes that she was told that she can longer have her Meloxicam for pain so they set her up with the orthopedic doctor at [facility]. She notes that she is a great grandma again and made a baby blanket for the baby. She notes that she got a brand new bible from a couple that she knows from church. She notes that things at home have been okay. She notes that she got a break from her grandchildren, son, and daughter in law for a couple of days. She notes that her mood has been good for the most part but can get irritated from time to time. She discussed situational stressors with her son. She notes that some nights that she can relax and fall asleep compared to other nights. She denies suicidal and homicidal thoughts.  Duration of problem: ; Severity of problem: mild  OBJECTIVE: Mood: Euthymic and Affect: Appropriate Risk of harm to self or others: No plan to harm self or others  LIFE CONTEXT: Family and Social: see above. School/Work: see above. Self-Care: see above. Life Changes: see above.   GOALS ADDRESSED: Patient will: 1.  Reduce symptoms of: depression  2.  Increase knowledge and/or ability of: healthy habits and self-management skills  3.  Demonstrate ability to: Increase healthy adjustment to current life circumstances  INTERVENTIONS: Interventions utilized:  Supportive Counseling was utilized by the clinician during today's follow up session. Clinician processed with the patient regarding how she has been doing since the last follow up session. Clinician measured the patient's anxiety and  depression on a numerical scale. Clinician utilized reflective listening encouraging the patient to ventilate her feelings towards her current situation. Clinician reiterated the sleep hygiene strategies that were discussed at previous follow up sessions. Clinician encouraged the patient to practice self care care and utilize her coping skills.  Standardized Assessments completed: PHQ 9  ASSESSMENT: Patient currently experiencing see above.   Patient may benefit from see above.   PLAN: 1. Follow up with behavioral health clinician on :  2. Behavioral recommendations: see above. 3. Referral(s): [place] (In Clinic) 4. "From scale of 1-10, how likely are you to follow plan?":   Bayard Hugger, LCSW

## 2019-12-19 ENCOUNTER — Ambulatory Visit: Payer: Self-pay | Admitting: Specialist

## 2019-12-21 ENCOUNTER — Ambulatory Visit: Payer: Self-pay | Admitting: Licensed Clinical Social Worker

## 2019-12-21 ENCOUNTER — Telehealth: Payer: Self-pay | Admitting: Licensed Clinical Social Worker

## 2019-12-21 NOTE — Telephone Encounter (Signed)
Clinician attempted to contact the patient twice during their scheduled phone visit and left voicemail with contact information.

## 2020-01-03 ENCOUNTER — Telehealth: Payer: Self-pay | Admitting: Licensed Clinical Social Worker

## 2020-01-03 NOTE — Telephone Encounter (Signed)
Clinician attempted to call and reschedule missed appointment; No Answer

## 2020-01-04 NOTE — Telephone Encounter (Signed)
Clinician attempted to call and reschedule missed appointment; No Answer

## 2020-01-12 ENCOUNTER — Other Ambulatory Visit: Payer: Self-pay | Admitting: Gerontology

## 2020-01-12 DIAGNOSIS — I10 Essential (primary) hypertension: Secondary | ICD-10-CM

## 2020-01-16 ENCOUNTER — Telehealth: Payer: Self-pay | Admitting: Licensed Clinical Social Worker

## 2020-01-16 ENCOUNTER — Telehealth: Payer: Self-pay | Admitting: Pharmacist

## 2020-01-16 NOTE — Telephone Encounter (Signed)
Left patient voicemail with contact information to call and reschedule her missed appointment with Julian Hy, LCSW

## 2020-01-16 NOTE — Telephone Encounter (Signed)
01/16/2020 12:24:02 PM - Fraser Din. has not returned paperwork June/closed  -- Elmer Picker - Tuesday, January 16, 2020 12:22 PM --Patient has not returned [place] paperwork mailed to her Jan. 5 & June 9--I have closed today. This is for Advair & Ventolin.

## 2020-01-18 ENCOUNTER — Other Ambulatory Visit: Payer: Self-pay

## 2020-01-18 ENCOUNTER — Ambulatory Visit: Payer: Self-pay | Admitting: Pharmacist

## 2020-01-18 DIAGNOSIS — Z79899 Other long term (current) drug therapy: Secondary | ICD-10-CM
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
## 2020-01-24 ENCOUNTER — Other Ambulatory Visit: Payer: Self-pay

## 2020-01-25 ENCOUNTER — Ambulatory Visit: Payer: Self-pay | Admitting: Gerontology

## 2020-01-30 ENCOUNTER — Other Ambulatory Visit: Payer: Self-pay | Admitting: Gerontology

## 2020-01-30 DIAGNOSIS — Z8659 Personal history of other mental and behavioral disorders: Secondary | ICD-10-CM

## 2020-01-30 MED ORDER — MIRTAZAPINE 15 MG PO TABS: 15.0000 mg | ORAL_TABLET | Freq: Every day | ORAL | 2 refills | Status: DC

## 2020-01-30 MED ORDER — SERTRALINE HCL 50 MG PO TABS: 150.0000 mg | ORAL_TABLET | Freq: Every day | ORAL | 2 refills | Status: DC

## 2020-02-01 ENCOUNTER — Other Ambulatory Visit: Payer: Self-pay

## 2020-02-01 ENCOUNTER — Ambulatory Visit: Payer: Self-pay | Admitting: Licensed Clinical Social Worker

## 2020-02-01 DIAGNOSIS — F331 Major depressive disorder, recurrent, moderate: Secondary | ICD-10-CM
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
## 2020-02-15 ENCOUNTER — Other Ambulatory Visit: Payer: Self-pay

## 2020-02-15 DIAGNOSIS — E119 Type 2 diabetes mellitus without complications: Secondary | ICD-10-CM

## 2020-02-16 LAB — COMPREHENSIVE METABOLIC PANEL
ALT: 62 IU/L — ABNORMAL HIGH (ref 0–32)
AST: 66 IU/L — ABNORMAL HIGH (ref 0–40)
Albumin/Globulin Ratio: 2 (ref 1.2–2.2)
Albumin: 4.9 g/dL — ABNORMAL HIGH (ref 3.8–4.8)
Alkaline Phosphatase: 150 IU/L — ABNORMAL HIGH (ref 48–121)
BUN/Creatinine Ratio: 9 — ABNORMAL LOW (ref 12–28)
BUN: 9 mg/dL (ref 8–27)
Bilirubin Total: 0.4 mg/dL (ref 0.0–1.2)
CO2: 22 mmol/L (ref 20–29)
Calcium: 9.6 mg/dL (ref 8.7–10.3)
Chloride: 102 mmol/L (ref 96–106)
Creatinine, Ser: 1.03 mg/dL — ABNORMAL HIGH (ref 0.57–1.00)
GFR calc Af Amer: 66 mL/min/{1.73_m2} (ref >59)
GFR calc non Af Amer: 58 mL/min/{1.73_m2} — ABNORMAL LOW (ref >59)
Globulin, Total: 2.4 g/dL (ref 1.5–4.5)
Glucose: 134 mg/dL — ABNORMAL HIGH (ref 65–99)
Potassium: 3.9 mmol/L (ref 3.5–5.2)
Sodium: 138 mmol/L (ref 134–144)
Total Protein: 7.3 g/dL (ref 6.0–8.5)

## 2020-02-16 LAB — HEMOGLOBIN A1C
Est. average glucose Bld gHb Est-mCnc: 148 mg/dL
Hgb A1c MFr Bld: 6.8 % — ABNORMAL HIGH (ref 4.8–5.6)

## 2020-02-16 LAB — MICROALBUMIN / CREATININE URINE RATIO
Creatinine, Urine: 319.2 mg/dL
Microalb/Creat Ratio: 52 mg/g creat — ABNORMAL HIGH (ref 0–29)
Microalbumin, Urine: 166.9 ug/mL

## 2020-02-20 ENCOUNTER — Ambulatory Visit: Payer: Self-pay | Admitting: Licensed Clinical Social Worker

## 2020-02-20 ENCOUNTER — Other Ambulatory Visit: Payer: Self-pay

## 2020-02-20 DIAGNOSIS — F331 Major depressive disorder, recurrent, moderate: Secondary | ICD-10-CM
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
## 2020-02-29 ENCOUNTER — Ambulatory Visit: Payer: Self-pay | Admitting: Gerontology

## 2020-02-29 ENCOUNTER — Other Ambulatory Visit: Payer: Self-pay | Admitting: Gerontology

## 2020-02-29 ENCOUNTER — Encounter: Payer: Self-pay | Admitting: Gerontology

## 2020-02-29 ENCOUNTER — Other Ambulatory Visit: Payer: Self-pay

## 2020-02-29 VITALS — BP 138/78 | HR 81 | Ht 64.0 in | Wt 165.7 lb

## 2020-02-29 DIAGNOSIS — F17200 Nicotine dependence, unspecified, uncomplicated: Secondary | ICD-10-CM

## 2020-02-29 DIAGNOSIS — R748 Abnormal levels of other serum enzymes: Secondary | ICD-10-CM

## 2020-02-29 DIAGNOSIS — Z8719 Personal history of other diseases of the digestive system: Secondary | ICD-10-CM | POA: Insufficient documentation

## 2020-02-29 DIAGNOSIS — I10 Essential (primary) hypertension: Secondary | ICD-10-CM

## 2020-02-29 DIAGNOSIS — E119 Type 2 diabetes mellitus without complications: Secondary | ICD-10-CM

## 2020-02-29 DIAGNOSIS — IMO0001 Reserved for inherently not codable concepts without codable children: Secondary | ICD-10-CM

## 2020-02-29 MED ORDER — PANTOPRAZOLE SODIUM 40 MG PO TBEC: 40.0000 mg | DELAYED_RELEASE_TABLET | Freq: Every day | ORAL | 3 refills | Status: DC

## 2020-02-29 MED ORDER — GLIPIZIDE 5 MG PO TABS: 5.0000 mg | ORAL_TABLET | Freq: Every evening | ORAL | 2 refills | Status: DC

## 2020-02-29 MED ORDER — AMLODIPINE BESYLATE 10 MG PO TABS: 10.0000 mg | ORAL_TABLET | Freq: Every day | ORAL | 3 refills | Status: DC

## 2020-02-29 NOTE — Patient Instructions (Signed)
Fat and Cholesterol Restricted Eating Plan Getting too much fat and cholesterol in your diet may cause health problems. Choosing the right foods helps keep your fat and cholesterol at normal levels. This can keep you from getting certain diseases. Your doctor may recommend an eating plan that includes:  Total fat: ______% or less of total calories a day.  Saturated fat: ______% or less of total calories a day.  Cholesterol: less than _________mg a day.  Fiber: ______g a day. What are tips for following this plan? Meal planning  At meals, divide your plate into four equal parts: ? Fill one-half of your plate with vegetables and green salads. ? Fill one-fourth of your plate with whole grains. ? Fill one-fourth of your plate with low-fat (lean) protein foods.  Eat fish that is high in omega-3 fats at least two times a week. This includes mackerel, tuna, sardines, and salmon.  Eat foods that are high in fiber, such as whole grains, beans, apples, broccoli, carrots, peas, and barley. General tips   Work with your doctor to lose weight if you need to.  Avoid: ? Foods with added sugar. ? Fried foods. ? Foods with partially hydrogenated oils.  Limit alcohol intake to no more than 1 drink a day for nonpregnant women and 2 drinks a day for men. One drink equals 12 oz of beer, 5 oz of wine, or 1 oz of hard liquor. Reading food labels  Check food labels for: ? Trans fats. ? Partially hydrogenated oils. ? Saturated fat (g) in each serving. ? Cholesterol (mg) in each serving. ? Fiber (g) in each serving.  Choose foods with healthy fats, such as: ? Monounsaturated fats. ? Polyunsaturated fats. ? Omega-3 fats.  Choose grain products that have whole grains. Look for the word "whole" as the first word in the ingredient list. Cooking  Cook foods using low-fat methods. These include baking, boiling, grilling, and broiling.  Eat more home-cooked foods. Eat at restaurants and buffets  less often.  Avoid cooking using saturated fats, such as butter, cream, palm oil, palm kernel oil, and coconut oil. Recommended foods  Fruits  All fresh, canned (in natural juice), or frozen fruits. Vegetables  Fresh or frozen vegetables (raw, steamed, roasted, or grilled). Green salads. Grains  Whole grains, such as whole wheat or whole grain breads, crackers, cereals, and pasta. Unsweetened oatmeal, bulgur, barley, quinoa, or brown rice. Corn or whole wheat flour tortillas. Meats and other protein foods  Ground beef (85% or leaner), grass-fed beef, or beef trimmed of fat. Skinless chicken or [place]. Ground chicken or [place]. Pork trimmed of fat. All fish and seafood. Egg whites. Dried beans, peas, or lentils. Unsalted nuts or seeds. Unsalted canned beans. Nut butters without added sugar or oil. Dairy  Low-fat or nonfat dairy products, such as skim or 1% milk, 2% or reduced-fat cheeses, low-fat and fat-free ricotta or cottage cheese, or plain low-fat and nonfat yogurt. Fats and oils  Tub margarine without trans fats. Light or reduced-fat mayonnaise and salad dressings. Avocado. Olive, canola, sesame, or safflower oils. The items listed above may not be a complete list of foods and beverages you can eat. Contact a dietitian for more information. Foods to avoid Fruits  Canned fruit in heavy syrup. Fruit in cream or butter sauce. Fried fruit. Vegetables  Vegetables cooked in cheese, cream, or butter sauce. Fried vegetables. Grains  White bread. White pasta. White rice. Cornbread. Bagels, pastries, and croissants. Crackers and snack foods that contain trans fat  and hydrogenated oils. Meats and other protein foods  Fatty cuts of meat. Ribs, chicken wings, bacon, sausage, bologna, salami, chitterlings, fatback, hot dogs, bratwurst, and packaged lunch meats. Liver and organ meats. Whole eggs and egg yolks. Chicken and [place] with skin. Fried meat. Dairy  Whole or 2% milk, cream,  half-and-half, and cream cheese. Whole milk cheeses. Whole-fat or sweetened yogurt. Full-fat cheeses. Nondairy creamers and whipped toppings. Processed cheese, cheese spreads, and cheese curds. Beverages  Alcohol. Sugar-sweetened drinks such as sodas, lemonade, and fruit drinks. Fats and oils  Butter, stick margarine, lard, shortening, ghee, or bacon fat. Coconut, palm kernel, and palm oils. Sweets and desserts  Corn syrup, sugars, honey, and molasses. Candy. Jam and jelly. Syrup. Sweetened cereals. Cookies, pies, cakes, donuts, muffins, and ice cream. The items listed above may not be a complete list of foods and beverages you should avoid. Contact a dietitian for more information. Summary  Choosing the right foods helps keep your fat and cholesterol at normal levels. This can keep you from getting certain diseases.  At meals, fill one-half of your plate with vegetables and green salads.  Eat high-fiber foods, like whole grains, beans, apples, carrots, peas, and barley.  Limit added sugar, saturated fats, alcohol, and fried foods. This information is not intended to replace advice given to you by your health care provider. Make sure you discuss any questions you have with your health care provider. Document Revised: 02/02/2018 Document Reviewed: 02/16/2017 Elsevier Patient Education  2020 [place] for Diabetes Mellitus, Adult  Carbohydrate counting is a method of keeping track of how many carbohydrates you eat. Eating carbohydrates naturally increases the amount of sugar (glucose) in the blood. Counting how many carbohydrates you eat helps keep your blood glucose within normal limits, which helps you manage your diabetes (diabetes mellitus). It is important to know how many carbohydrates you can safely have in each meal. This is different for every person. A diet and nutrition specialist (registered dietitian) can help you make a meal plan and calculate how many  carbohydrates you should have at each meal and snack. Carbohydrates are found in the following foods:  Grains, such as breads and cereals.  Dried beans and soy products.  Starchy vegetables, such as potatoes, peas, and corn.  Fruit and fruit juices.  Milk and yogurt.  Sweets and snack foods, such as cake, cookies, candy, chips, and soft drinks. How do I count carbohydrates? There are two ways to count carbohydrates in food. You can use either of the methods or a combination of both. Reading "Nutrition Facts" on packaged food The "Nutrition Facts" list is included on the labels of almost all packaged foods and beverages in the [place] It includes:  The serving size.  Information about nutrients in each serving, including the grams (g) of carbohydrate per serving. To use the Nutrition Facts":  Decide how many servings you will have.  Multiply the number of servings by the number of carbohydrates per serving.  The resulting number is the total amount of carbohydrates that you will be having. Learning standard serving sizes of other foods When you eat carbohydrate foods that are not packaged or do not include "Nutrition Facts" on the label, you need to measure the servings in order to count the amount of carbohydrates:  Measure the foods that you will eat with a food scale or measuring cup, if needed.  Decide how many standard-size servings you will eat.  Multiply the number of servings by 15.  Most carbohydrate-rich foods have about 15 g of carbohydrates per serving. ? For example, if you eat 8 oz (170 g) of strawberries, you will have eaten 2 servings and 30 g of carbohydrates (2 servings x 15 g = 30 g).  For foods that have more than one food mixed, such as soups and casseroles, you must count the carbohydrates in each food that is included. The following list contains standard serving sizes of common carbohydrate-rich foods. Each of these servings has about 15 g of  carbohydrates:   hamburger bun or  English muffin.   oz (15 mL) syrup.   oz (14 g) jelly.  1 slice of bread.  1 six-inch tortilla.  3 oz (85 g) cooked rice or pasta.  4 oz (113 g) cooked dried beans.  4 oz (113 g) starchy vegetable, such as peas, corn, or potatoes.  4 oz (113 g) hot cereal.  4 oz (113 g) mashed potatoes or  of a large baked potato.  4 oz (113 g) canned or frozen fruit.  4 oz (120 mL) fruit juice.  4-6 crackers.  6 chicken nuggets.  6 oz (170 g) unsweetened dry cereal.  6 oz (170 g) plain fat-free yogurt or yogurt sweetened with artificial sweeteners.  8 oz (240 mL) milk.  8 oz (170 g) fresh fruit or one small piece of fruit.  24 oz (680 g) popped popcorn. Example of carbohydrate counting Sample meal  3 oz (85 g) chicken breast.  6 oz (170 g) brown rice.  4 oz (113 g) corn.  8 oz (240 mL) milk.  8 oz (170 g) strawberries with sugar-free whipped topping. Carbohydrate calculation 1. Identify the foods that contain carbohydrates: ? Rice. ? Corn. ? Milk. ? Strawberries. 2. Calculate how many servings you have of each food: ? 2 servings rice. ? 1 serving corn. ? 1 serving milk. ? 1 serving strawberries. 3. Multiply each number of servings by 15 g: ? 2 servings rice x 15 g = 30 g. ? 1 serving corn x 15 g = 15 g. ? 1 serving milk x 15 g = 15 g. ? 1 serving strawberries x 15 g = 15 g. 4. Add together all of the amounts to find the total grams of carbohydrates eaten: ? 30 g + 15 g + 15 g + 15 g = 75 g of carbohydrates total. Summary  Carbohydrate counting is a method of keeping track of how many carbohydrates you eat.  Eating carbohydrates naturally increases the amount of sugar (glucose) in the blood.  Counting how many carbohydrates you eat helps keep your blood glucose within normal limits, which helps you manage your diabetes.  A diet and nutrition specialist (registered dietitian) can help you make a meal plan and calculate  how many carbohydrates you should have at each meal and snack. This information is not intended to replace advice given to you by your health care provider. Make sure you discuss any questions you have with your health care provider. Document Revised: 12/24/2016 Document Reviewed: 11/13/2015 Elsevier Patient Education  [place] DASH stands for "Dietary Approaches to Stop Hypertension." The DASH eating plan is a healthy eating plan that has been shown to reduce high blood pressure (hypertension). It may also reduce your risk for type 2 diabetes, heart disease, and stroke. The DASH eating plan may also help with weight loss. What are tips for following this plan?  General guidelines  Avoid eating more than 2,300 mg (  milligrams) of salt (sodium) a day. If you have hypertension, you may need to reduce your sodium intake to 1,500 mg a day.  Limit alcohol intake to no more than 1 drink a day for nonpregnant women and 2 drinks a day for men. One drink equals 12 oz of beer, 5 oz of wine, or 1 oz of hard liquor.  Work with your health care provider to maintain a healthy body weight or to lose weight. Ask what an ideal weight is for you.  Get at least 30 minutes of exercise that causes your heart to beat faster (aerobic exercise) most days of the week. Activities may include walking, swimming, or biking.  Work with your health care provider or diet and nutrition specialist (dietitian) to adjust your eating plan to your individual calorie needs. Reading food labels   Check food labels for the amount of sodium per serving. Choose foods with less than 5 percent of the Daily Value of sodium. Generally, foods with less than 300 mg of sodium per serving fit into this eating plan.  To find whole grains, look for the word "whole" as the first word in the ingredient list. Shopping  Buy products labeled as "low-sodium" or "no salt added."  Buy fresh foods. Avoid canned foods and  premade or frozen meals. Cooking  Avoid adding salt when cooking. Use salt-free seasonings or herbs instead of table salt or sea salt. Check with your health care provider or pharmacist before using salt substitutes.  Do not fry foods. Cook foods using healthy methods such as baking, boiling, grilling, and broiling instead.  Cook with heart-healthy oils, such as olive, canola, soybean, or sunflower oil. Meal planning  Eat a balanced diet that includes: ? 5 or more servings of fruits and vegetables each day. At each meal, try to fill half of your plate with fruits and vegetables. ? Up to 6-8 servings of whole grains each day. ? Less than 6 oz of lean meat, poultry, or fish each day. A 3-oz serving of meat is about the same size as a deck of cards. One egg equals 1 oz. ? 2 servings of low-fat dairy each day. ? A serving of nuts, seeds, or beans 5 times each week. ? Heart-healthy fats. Healthy fats called Omega-3 fatty acids are found in foods such as flaxseeds and coldwater fish, like sardines, salmon, and mackerel.  Limit how much you eat of the following: ? Canned or prepackaged foods. ? Food that is high in trans fat, such as fried foods. ? Food that is high in saturated fat, such as fatty meat. ? Sweets, desserts, sugary drinks, and other foods with added sugar. ? Full-fat dairy products.  Do not salt foods before eating.  Try to eat at least 2 vegetarian meals each week.  Eat more home-cooked food and less restaurant, buffet, and fast food.  When eating at a restaurant, ask that your food be prepared with less salt or no salt, if possible. What foods are recommended? The items listed may not be a complete list. Talk with your dietitian about what dietary choices are best for you. Grains Whole-grain or whole-wheat bread. Whole-grain or whole-wheat pasta. Brown rice. [place]. Bulgur. Whole-grain and low-sodium cereals. Pita bread. Low-fat, low-sodium crackers. Whole-wheat  flour tortillas. Vegetables Fresh or frozen vegetables (raw, steamed, roasted, or grilled). Low-sodium or reduced-sodium tomato and vegetable juice. Low-sodium or reduced-sodium tomato sauce and tomato paste. Low-sodium or reduced-sodium canned vegetables. Fruits All fresh, dried, or frozen fruit.  Canned fruit in natural juice (without added sugar). Meat and other protein foods Skinless chicken or [place]. Ground chicken or [place]. Pork with fat trimmed off. Fish and seafood. Egg whites. Dried beans, peas, or lentils. Unsalted nuts, nut butters, and seeds. Unsalted canned beans. Lean cuts of beef with fat trimmed off. Low-sodium, lean deli meat. Dairy Low-fat (1%) or fat-free (skim) milk. Fat-free, low-fat, or reduced-fat cheeses. Nonfat, low-sodium ricotta or cottage cheese. Low-fat or nonfat yogurt. Low-fat, low-sodium cheese. Fats and oils Soft margarine without trans fats. Vegetable oil. Low-fat, reduced-fat, or light mayonnaise and salad dressings (reduced-sodium). Canola, safflower, olive, soybean, and sunflower oils. Avocado. Seasoning and other foods Herbs. Spices. Seasoning mixes without salt. Unsalted popcorn and pretzels. Fat-free sweets. What foods are not recommended? The items listed may not be a complete list. Talk with your dietitian about what dietary choices are best for you. Grains Baked goods made with fat, such as croissants, muffins, or some breads. Dry pasta or rice meal packs. Vegetables Creamed or fried vegetables. Vegetables in a cheese sauce. Regular canned vegetables (not low-sodium or reduced-sodium). Regular canned tomato sauce and paste (not low-sodium or reduced-sodium). Regular tomato and vegetable juice (not low-sodium or reduced-sodium). Angie Fava. Olives. Fruits Canned fruit in a light or heavy syrup. Fried fruit. Fruit in cream or butter sauce. Meat and other protein foods Fatty cuts of meat. Ribs. Fried meat. Berniece Salines. Sausage. Bologna and other processed lunch  meats. Salami. Fatback. Hotdogs. Bratwurst. Salted nuts and seeds. Canned beans with added salt. Canned or smoked fish. Whole eggs or egg yolks. Chicken or [place] with skin. Dairy Whole or 2% milk, cream, and half-and-half. Whole or full-fat cream cheese. Whole-fat or sweetened yogurt. Full-fat cheese. Nondairy creamers. Whipped toppings. Processed cheese and cheese spreads. Fats and oils Butter. Stick margarine. Lard. Shortening. Ghee. Bacon fat. Tropical oils, such as coconut, palm kernel, or palm oil. Seasoning and other foods Salted popcorn and pretzels. Onion salt, garlic salt, seasoned salt, table salt, and sea salt. Worcestershire sauce. Tartar sauce. Barbecue sauce. Teriyaki sauce. Soy sauce, including reduced-sodium. Steak sauce. Canned and packaged gravies. Fish sauce. Oyster sauce. Cocktail sauce. Horseradish that you find on the shelf. Ketchup. Mustard. Meat flavorings and tenderizers. Bouillon cubes. Hot sauce and Tabasco sauce. Premade or packaged marinades. Premade or packaged taco seasonings. Relishes. Regular salad dressings. Where to find more information:  National Heart, Lung, and [place]: https://wilson-eaton.com/  American Heart Association: www.heart.org Summary  The DASH eating plan is a healthy eating plan that has been shown to reduce high blood pressure (hypertension). It may also reduce your risk for type 2 diabetes, heart disease, and stroke.  With the DASH eating plan, you should limit salt (sodium) intake to 2,300 mg a day. If you have hypertension, you may need to reduce your sodium intake to 1,500 mg a day.  When on the DASH eating plan, aim to eat more fresh fruits and vegetables, whole grains, lean proteins, low-fat dairy, and heart-healthy fats.  Work with your health care provider or diet and nutrition specialist (dietitian) to adjust your eating plan to your individual calorie needs. This information is not intended to replace advice given to you by your  health care provider. Make sure you discuss any questions you have with your health care provider. Document Revised: 05/14/2017 Document Reviewed: 05/25/2016 Elsevier Patient Education  2020 [employer].
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
## 2020-03-06 ENCOUNTER — Telehealth: Payer: Self-pay | Admitting: Licensed Clinical Social Worker

## 2020-03-06 ENCOUNTER — Ambulatory Visit: Payer: Self-pay | Admitting: Licensed Clinical Social Worker

## 2020-03-06 NOTE — Telephone Encounter (Signed)
Called patient and left a message letting them know that Karla Carter had an emergency and will need to reschedule their 4:00 pm appointment.  Will call her back in 5 minutes to see if she answers.  Called patient back again and left another message letting her know that due to an emergency Karla Carter was not able to keep their appointment.  Left phone number for patient to call to reschedule appointment.

## 2020-03-07 NOTE — Telephone Encounter (Signed)
Called patient to reschedule her appointment with Jerrilyn Cairo.

## 2020-03-20 NOTE — Telephone Encounter (Signed)
Left voicemail message letting patient know that I was calling to reschedule her to see Jerrilyn Cairo and provided clinic phone number.

## 2020-03-20 NOTE — Telephone Encounter (Signed)
Patient called back and rescheduled appointment with Jerrilyn Cairo.

## 2020-03-21 ENCOUNTER — Other Ambulatory Visit: Payer: Self-pay | Admitting: Gerontology

## 2020-03-21 DIAGNOSIS — I10 Essential (primary) hypertension: Secondary | ICD-10-CM

## 2020-04-09 ENCOUNTER — Other Ambulatory Visit: Payer: Self-pay

## 2020-04-09 ENCOUNTER — Ambulatory Visit: Payer: Self-pay | Admitting: Licensed Clinical Social Worker

## 2020-04-09 DIAGNOSIS — F331 Major depressive disorder, recurrent, moderate: Secondary | ICD-10-CM
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
## 2020-04-16 ENCOUNTER — Telehealth: Payer: Self-pay | Admitting: Licensed Clinical Social Worker

## 2020-04-16 ENCOUNTER — Ambulatory Visit: Payer: Self-pay | Admitting: Licensed Clinical Social Worker

## 2020-04-16 NOTE — Telephone Encounter (Signed)
Left message for patient letting her know that the purpose of the call was to reschedule her appointment to see [facility].  Social work intern rescheduled her appointment for 11:30 a. m. on November 10th so that appointment would not be gone before she called back.  Will verify that this works for patient.

## 2020-04-18 ENCOUNTER — Telehealth: Payer: Self-pay

## 2020-04-18 NOTE — Telephone Encounter (Signed)
Left message for patient that the purpose of the call was to reschedule her appointment with Jerrilyn Cairo and left the clinic phone number.

## 2020-04-23 ENCOUNTER — Telehealth: Payer: Self-pay

## 2020-04-23 NOTE — Telephone Encounter (Signed)
Social work Theatre manager called patient to confirm her appointment with Karla Carter for November 17th at 2:30 pm.  Patient said that this appointment works.

## 2020-04-24 ENCOUNTER — Ambulatory Visit: Payer: Self-pay | Admitting: Licensed Clinical Social Worker

## 2020-05-01 ENCOUNTER — Ambulatory Visit: Payer: Self-pay | Admitting: Licensed Clinical Social Worker

## 2020-05-01 DIAGNOSIS — F331 Major depressive disorder, recurrent, moderate: Secondary | ICD-10-CM

## 2020-05-01 NOTE — BH Specialist Note (Signed)
Integrated Behavioral Health Follow Up Visit  MRN: [id number] Name: Karla Carter   Type of Service: [place] Interpretor:No. Interpretor Name and Language:   SUBJECTIVE: Karla Carter is a 64 y.o. female accompanied by Herself  Patient was referred by Karla Shadow, NP for mental health. Patient reports the following symptoms/concerns: The patient reports that," things have not been that bad."  She noted that she has been having some stomach pain and upset and her spouse is encouraging her to go to see her doctor. She stated that she has been sad since her brother in law passed away on his birthday this year. She reports her son was injured at work and she is waiting to find out if he is going to have surgery or not. The patient reports that she is going to have cook the entire Thanksgiving dinner for her family. She noted that her son usually pitches in but is now unable to help out. The patient discussed financial and familial stressors. She stated that she is taking her medications for her depression and anxiety everyday and hopes they will start working better now. The patient stated that she does have thoughts a couple of times a week about ending her life, but denied having any plan or means to carry out a plan. The patient denied access to firearms. She further denied any homicidal thoughts. The patient stated that her dog and her faith were supports in her life.  Duration of problem: ; Severity of problem: moderate  OBJECTIVE: Mood: Euthymic and Affect: Appropriate Risk of harm to self or others: No plan to harm self or others  LIFE CONTEXT: Family and Social: see above School/Work: see above Self-Care: see above Life Changes: see above  GOALS ADDRESSED: Patient will: 1.  Reduce symptoms of: anxiety and depression  2.  Increase knowledge and/or ability of: coping skills, healthy habits and stress reduction  3.  Demonstrate ability to:  Increase healthy adjustment to current life circumstances  INTERVENTIONS: Interventions utilized:  Supportive Counseling was utilized by the clinician during today's follow up session. Clinician processed with the patient regarding how she has been doing since the last follow up session. Clinician measured the patient's depression on a numerical scale. Clinician provided space for the patient to ventilate her feelings toward her current life circumstances. The clinician encouraged the patient to focus on what is in her control versus what is out of her control. Clinician suggested that the patient practice the sleep hygiene techniques discussed with previous clinician. Clinician gave resources to patient for [facility] walk in and mobile crisis line and explained that she could also go the emergency department if she had worsening thoughtssuicidecide. Standardized Assessments completed: GAD-7 and PHQ 9  GAD-7  17 PHQ-9  16  ASSESSMENT: Patient currently experiencing see above  Patient may benefit from see above  PLAN: 1. Follow up with behavioral health clinician on : 05/22/20 at 2:30 PM  2. Behavioral recommendations:  3. Referral(s): [facility] [place] [facility] (In Clinic) 4. "From scale of 1-10, how likely are you to follow plan?":   Karla Carter, Student-Social Work

## 2020-05-15 ENCOUNTER — Other Ambulatory Visit: Payer: Self-pay

## 2020-05-16 ENCOUNTER — Other Ambulatory Visit: Payer: Self-pay

## 2020-05-16 ENCOUNTER — Telehealth: Payer: Self-pay | Admitting: *Deleted

## 2020-05-16 ENCOUNTER — Encounter: Payer: Self-pay | Admitting: *Deleted

## 2020-05-16 NOTE — Telephone Encounter (Signed)
Attempted to contact and schedule lung screening scan. Message left for patient to call back to schedule. 

## 2020-05-21 ENCOUNTER — Other Ambulatory Visit: Payer: Self-pay

## 2020-05-22 ENCOUNTER — Telehealth: Payer: Self-pay | Admitting: Licensed Clinical Social Worker

## 2020-05-22 ENCOUNTER — Ambulatory Visit: Payer: Self-pay | Admitting: Licensed Clinical Social Worker

## 2020-05-22 NOTE — Telephone Encounter (Signed)
Called the patient twice regarding today's appointment. Left a voice mail with contact information

## 2020-05-23 ENCOUNTER — Ambulatory Visit: Payer: Self-pay | Admitting: Gerontology

## 2020-06-05 ENCOUNTER — Telehealth: Payer: Self-pay | Admitting: *Deleted

## 2020-06-05 NOTE — Telephone Encounter (Signed)
Attempted to contact and schedule lung screening scan. Message left for patient to call back to schedule. 

## 2020-06-17 ENCOUNTER — Encounter: Payer: Self-pay | Admitting: *Deleted

## 2020-06-17 ENCOUNTER — Telehealth: Payer: Self-pay | Admitting: *Deleted

## 2020-06-17 NOTE — Telephone Encounter (Signed)
Attempted to contact and schedule lung screening scan. Message left for patient to call back to schedule. Will mail letter to patient in final attempt to contact.

## 2020-06-25 ENCOUNTER — Other Ambulatory Visit: Payer: Self-pay

## 2020-06-25 ENCOUNTER — Encounter: Payer: Self-pay | Admitting: Gerontology

## 2020-06-25 ENCOUNTER — Other Ambulatory Visit: Payer: Self-pay | Admitting: Internal Medicine

## 2020-06-25 ENCOUNTER — Ambulatory Visit: Payer: Self-pay | Admitting: Gerontology

## 2020-06-25 VITALS — BP 150/75 | HR 78 | Resp 16 | Wt 166.9 lb

## 2020-06-25 DIAGNOSIS — L989 Disorder of the skin and subcutaneous tissue, unspecified: Secondary | ICD-10-CM

## 2020-06-25 DIAGNOSIS — E119 Type 2 diabetes mellitus without complications: Secondary | ICD-10-CM

## 2020-06-25 DIAGNOSIS — I10 Essential (primary) hypertension: Secondary | ICD-10-CM

## 2020-06-25 DIAGNOSIS — Z8659 Personal history of other mental and behavioral disorders: Secondary | ICD-10-CM

## 2020-06-25 DIAGNOSIS — Z8709 Personal history of other diseases of the respiratory system: Secondary | ICD-10-CM

## 2020-06-25 LAB — POCT GLYCOSYLATED HEMOGLOBIN (HGB A1C)
HbA1c POC (<> result, manual entry): 7.2 % (ref 4.0–5.6)
HbA1c, POC (controlled diabetic range): 0 % (ref 0.0–7.0)
HbA1c, POC (prediabetic range): 0 % — AB (ref 5.7–6.4)
Hemoglobin A1C: 7.2 % — AB (ref 4.0–5.6)

## 2020-06-25 MED ORDER — GUAIFENESIN 100 MG/5ML PO SYRP: 200.0000 mg | ORAL_SOLUTION | Freq: Three times a day (TID) | ORAL | 0 refills | Status: DC | PRN

## 2020-06-25 MED ORDER — MIRTAZAPINE 15 MG PO TABS: 15.0000 mg | ORAL_TABLET | Freq: Every day | ORAL | 0 refills | Status: DC

## 2020-06-25 MED ORDER — GUAIFENESIN ER 600 MG PO TB12: 600.0000 mg | ORAL_TABLET | Freq: Two times a day (BID) | ORAL | Status: DC

## 2020-06-25 MED ORDER — LISINOPRIL 5 MG PO TABS: 5.0000 mg | ORAL_TABLET | Freq: Every day | ORAL | 0 refills | Status: DC

## 2020-06-25 MED ORDER — FLUTICASONE-SALMETEROL 100-50 MCG/DOSE IN AEPB: 1.0000 | INHALATION_SPRAY | Freq: Two times a day (BID) | RESPIRATORY_TRACT | 0 refills | Status: DC

## 2020-06-25 MED ORDER — AMLODIPINE BESYLATE 10 MG PO TABS: 10.0000 mg | ORAL_TABLET | Freq: Every day | ORAL | 2 refills | Status: DC

## 2020-06-25 MED ORDER — AZITHROMYCIN 250 MG PO TABS: 250.0000 mg | ORAL_TABLET | Freq: Every day | ORAL | 0 refills | Status: AC

## 2020-06-25 MED ORDER — SERTRALINE HCL 50 MG PO TABS: 150.0000 mg | ORAL_TABLET | Freq: Every day | ORAL | 0 refills | Status: DC

## 2020-06-25 MED ORDER — GLIPIZIDE 5 MG PO TABS: ORAL_TABLET | ORAL | 1 refills | Status: DC

## 2020-06-25 MED ORDER — ALBUTEROL SULFATE HFA 108 (90 BASE) MCG/ACT IN AERS: 2.0000 | INHALATION_SPRAY | RESPIRATORY_TRACT | 1 refills | Status: DC | PRN
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
## 2020-06-25 NOTE — Patient Instructions (Signed)
You will need lab work to check your kidney function and liver function test in 1 week.   I have increase your glipizide to 2.5 mg in the morning and continue to take 5 mg at night.   You  have a new Blood pressure medication, Lisinopril. Is very important that you get lab work one week after you start taking this medications.

## 2020-06-26 ENCOUNTER — Encounter: Payer: Self-pay | Admitting: Gerontology

## 2020-06-27 ENCOUNTER — Ambulatory Visit: Payer: Self-pay | Admitting: Licensed Clinical Social Worker

## 2020-06-27 ENCOUNTER — Other Ambulatory Visit: Payer: Self-pay

## 2020-06-27 DIAGNOSIS — F331 Major depressive disorder, recurrent, moderate: Secondary | ICD-10-CM

## 2020-06-27 NOTE — BH Specialist Note (Signed)
Integrated Behavioral Health Follow Up In-Person Visit  MRN: [id number] Name: Karla Carter   Total time: 30 minutes  Types of Service: Telephone visit  Interpretor:No. Interpretor Name and Language:   Subjective: Karla Carter is a 65 y.o. female accompanied by herself Patient was referred by Carlyon Shadow, NP  for mental health . Patient reports the following symptoms/concerns: The patient reports that her best friend of over 50 years passed away after a battle with cancer. She reports that her husband made her stop talking to her friend because he was jealous and she was having feelings of resentment towards her spouse and regret in not speaking to her friend before he passed. The patient noted other finical and family stressors. She discussed her living arrangements with her son and the unequal distrubution of household responsibilities. The patient noted that she tries to keep to herself and not rock the boat to much. She states that she is spending a lot of her time alone in her room. The patient noted that she lost a beloved Programmer, systems. The patient denies any suicidal or homicidal thoughts.  Duration of problem: ; Severity of problem: moderate  Objective: Mood: Depressed and Affect: . Risk of harm to self or others: No plan to harm self or others  Life Context: Family and Social: see above School/Work: see above Self-Care: see above Life Changes: see above  Patient and/or Family's Strengths/Protective Factors: Concrete supports in place (healthy food, safe environments, etc.)  Goals Addressed: Patient will: 1.  Reduce symptoms of: anxiety, depression and stress  2.  Increase knowledge and/or ability of: coping skills, healthy habits and stress reduction  3.  Demonstrate ability to: Increase healthy adjustment to current life circumstances  Progress towards Goals: Ongoing  Interventions: Interventions utilized:  Supportive Counseling  was utilized by the clinician  during today's follow up session. Clinician processed with the patient regarding how she has been doing since the last follow up session. Clinician measured the patient's anxiety and depression on a numerical scale. The clinician provided space for patient to ventilate her frustrations regarding her current life circumstances. Clinician discussed with the patient regarding the cause of her most recent episode of depression.Clinician explained to the patient that it sounds like she when she has something to do and focus on that it improves her overall mood. Clinician encouraged the patient to crochet and utilize her coping skills.  Standardized Assessments completed: GAD-7 and PHQ 9  GAD-7    17 PHQ-9   18   Assessment: Patient currently experiencing see above  Patient may benefit from see above.  Plan: 1. Follow up with behavioral health clinician on : 07/10/2020 at 3:00 pm  2. Behavioral recommendations:  3. Referral(s): [facility] (In Clinic) 4. "From scale of 1-10, how likely are you to follow plan?":   Lesli Albee, Student-Social Work

## 2020-07-03 ENCOUNTER — Other Ambulatory Visit: Payer: Self-pay

## 2020-07-10 ENCOUNTER — Ambulatory Visit: Payer: Self-pay | Admitting: Licensed Clinical Social Worker

## 2020-07-10 ENCOUNTER — Telehealth: Payer: Self-pay | Admitting: Pharmacist

## 2020-07-10 ENCOUNTER — Other Ambulatory Visit: Payer: Self-pay

## 2020-07-10 DIAGNOSIS — F331 Major depressive disorder, recurrent, moderate: Secondary | ICD-10-CM

## 2020-07-10 NOTE — Telephone Encounter (Signed)
07/10/2020 2:10:20 PM - Advair & ProAir pending  -- Elmer Picker - Wednesday, July 10, 2020 2:08 PM --I have received the signed paperwork back from [place] for [place] for patient to return her portion. I put information in bag with meds 06/26/2020-medication has been picked up--wait for patient to return.

## 2020-07-10 NOTE — BH Specialist Note (Signed)
Integrated Behavioral Health Follow Up In-Person Visit  MRN: [id number] Name: Karla Carter   Total time: 60 minutes  Types of Service: [place] ([facility])  Interpretor:No. Interpretor Name and Language:  Subjective: Karla Carter is a 65 y.o. female accompanied by herself Patient was referred by Carlyon Shadow, NP  for mental health. Patient reports the following symptoms/concerns: The patient states that her son just called and told her that her aunt had passed away. She stated her aunt had dementia and had a heart attack a few months ago and was placed in hospice. She notes that now her mother is the only one left on that side of the family, and she is concerned that her mother will be next to go. The patient discussed familial and health stressors. The patient discussed her previous job and how much she enjoyed that time in her life. The patient noted that she is still having a hard time falling asleep and staying asleep. She reports that she wakes up 4-5 times a night and sleeps a total of 4 - 5 hours per night. The patient notes that she takes 10-12 MG of Melatonin at bedtime. The patient discussed financial stressors and the difficulty finding supplies in the stores. The patient notes that she is looking forward to having her favorite meal for supper but wishes others in the home would pitch in on the household work. The patient denied any suicidal or homicidal thoughts.  Duration of problem: ; Severity of problem: moderate  Objective: Mood: Euthymic and Affect: Appropriate Risk of harm to self or others: No plan to harm self or others  Life Context: Family and Social: see above School/Work: see above Self-Care: see above Life Changes: see above  Patient and/or Family's Strengths/Protective Factors: Concrete supports in place (healthy food, safe environments, etc.)  Goals Addressed: Patient will: 1.  Reduce symptoms of: anxiety, depression and stress   2.  Increase knowledge and/or ability of: coping skills, healthy habits and stress reduction  3.  Demonstrate ability to: Increase healthy adjustment to current life circumstances  Progress towards Goals: Ongoing  Interventions: Interventions utilized:  Supportive Counseling was utilized by clinician during today's follow up visit. The clinician processed with the patient how she has been doing since her last follow-up session. The clinician utilized reflective listening to provide a safe space for the patient to ventilate her frustrations. The clinician measured the patients anxiety and depression on a numerical scale. Clinician offered her condolences on the loss of her aunt. Clinician encouraged the patient to focus on the positives rather than the negatives and practice self care. Clinician informed the patient that the clinic had moved and provided her with the new address and directions.  Standardized Assessments completed: GAD-7 and PHQ 9  GAD-7   17 PHQ-9   17   Assessment: Patient currently experiencing see above  Patient may benefit from see above  Plan: 1. Follow up with behavioral health clinician on : 07/24/2020 at 3:00 PM  2. Behavioral recommendations:  3. Referral(s): [place] (In Clinic) 4. "From scale of 1-10, how likely are you to follow plan?":   Lesli Albee, Student-Social Work

## 2020-07-17 ENCOUNTER — Other Ambulatory Visit: Payer: Self-pay

## 2020-07-18 ENCOUNTER — Other Ambulatory Visit: Payer: Self-pay

## 2020-07-18 DIAGNOSIS — E119 Type 2 diabetes mellitus without complications: Secondary | ICD-10-CM

## 2020-07-19 LAB — COMPREHENSIVE METABOLIC PANEL
ALT: 34 IU/L — ABNORMAL HIGH (ref 0–32)
AST: 35 IU/L (ref 0–40)
Albumin/Globulin Ratio: 1.8 (ref 1.2–2.2)
Albumin: 4.8 g/dL (ref 3.8–4.8)
Alkaline Phosphatase: 120 IU/L (ref 44–121)
BUN/Creatinine Ratio: 11 — ABNORMAL LOW (ref 12–28)
BUN: 9 mg/dL (ref 8–27)
Bilirubin Total: 0.3 mg/dL (ref 0.0–1.2)
CO2: 21 mmol/L (ref 20–29)
Calcium: 9.6 mg/dL (ref 8.7–10.3)
Chloride: 105 mmol/L (ref 96–106)
Creatinine, Ser: 0.83 mg/dL (ref 0.57–1.00)
GFR calc Af Amer: 86 mL/min/{1.73_m2} (ref >59)
GFR calc non Af Amer: 75 mL/min/{1.73_m2} (ref >59)
Globulin, Total: 2.6 g/dL (ref 1.5–4.5)
Glucose: 109 mg/dL — ABNORMAL HIGH (ref 65–99)
Potassium: 4.3 mmol/L (ref 3.5–5.2)
Sodium: 141 mmol/L (ref 134–144)
Total Protein: 7.4 g/dL (ref 6.0–8.5)

## 2020-07-19 LAB — LIPID PANEL
Chol/HDL Ratio: 5.9 ratio — ABNORMAL HIGH (ref 0.0–4.4)
Cholesterol, Total: 243 mg/dL — ABNORMAL HIGH (ref 100–199)
HDL: 41 mg/dL (ref >39)
LDL Chol Calc (NIH): 155 mg/dL — ABNORMAL HIGH (ref 0–99)
Triglycerides: 254 mg/dL — ABNORMAL HIGH (ref 0–149)
VLDL Cholesterol Cal: 47 mg/dL — ABNORMAL HIGH (ref 5–40)

## 2020-07-19 LAB — HEMOGLOBIN A1C
Est. average glucose Bld gHb Est-mCnc: 148 mg/dL
Hgb A1c MFr Bld: 6.8 % — ABNORMAL HIGH (ref 4.8–5.6)

## 2020-07-23 ENCOUNTER — Telehealth: Payer: Self-pay | Admitting: Gerontology

## 2020-07-24 ENCOUNTER — Other Ambulatory Visit: Payer: Self-pay

## 2020-07-24 ENCOUNTER — Ambulatory Visit: Payer: Self-pay | Admitting: Licensed Clinical Social Worker

## 2020-07-24 DIAGNOSIS — F331 Major depressive disorder, recurrent, moderate: Secondary | ICD-10-CM

## 2020-07-24 NOTE — BH Specialist Note (Signed)
Integrated Behavioral Health Follow Up In-Person Visit  MRN: [id number] Name: MARYKATHERINE SHERWOOD   Total time: 60 minutes  Types of Service: [place] ([facility])  Interpretor:No. Interpretor Name and Language:   Patient consents to telephone visit and 2 patient identifiers were used to identify patient.  Subjective: DEBORAHANN POTEAT is a 65 y.o. female accompanied by herself  Patient was referred by Carlyon Shadow, Np  for mental health. Patient reports the following symptoms/concerns: The patient reports that her central heating and air unit broke last night and she is running her dryer and wrapping up in blankets to keep warm. She notes that she has someone in her family who is stopping by to look at the unit but is concerned that her family may not be able to afford the parts to fix the older unit. The patient states that she is starting to feel better but still is experiencing a cough especially at night that is making sleep more difficult than usual. She notes that her energy levels have been worse but contributes that to the after effects of COVID. The patient discussed financial and family stressors. She noted that she is still feels like she is doing more than her fair share of chores in the home. She discussed her previous employment and how she felt fulfilled and enjoyed working outside of the home then. The patient stated, "everything else is going okay." The patient denied any suicidal or homicidal thoughts.  Duration of problem: ; Severity of problem: moderate  Objective: Mood: Euthymic and Affect: Appropriate Risk of harm to self or others: No plan to harm self or others  Life Context: Family and Social: see above School/Work: see above Self-Care: see above Life Changes: see above  Patient and/or Family's Strengths/Protective Factors: Concrete supports in place (healthy food, safe environments, etc.)  Goals Addressed: Patient will: 1.  Reduce symptoms  of: anxiety, depression and stress  2.  Increase knowledge and/or ability of: coping skills, healthy habits and stress reduction  3.  Demonstrate ability to: Increase healthy adjustment to current life circumstances  Progress towards Goals: Ongoing  Interventions: Interventions utilized:  Supportive Counseling was utilized by the clinician during today's follow up session. The clinician processed with the patient how they have been doing since the last follow-up session. The clinician provided a space for the patient to ventilate their frustrations regarding their current life circumstances. Clinician measured the patient's anxiety and depression on a numerical scale. The clinician encouraged the patient to utilize their coping skills to deal with their current life circumstances.  Standardized Assessments completed: GAD-7 and PHQ 9  GAD-7      15 PHQ-9      17   Assessment: Patient currently experiencing see above  Patient may benefit from see above  Plan: 1. Follow up with behavioral health clinician on :  2. Behavioral recommendations: 3. Referral(s): [place] (In Clinic) 4. "From scale of 1-10, how likely are you to follow plan?":   Lesli Albee, Student-Social Work

## 2020-07-25 ENCOUNTER — Telehealth: Payer: Self-pay | Admitting: Pharmacist

## 2020-07-25 ENCOUNTER — Other Ambulatory Visit: Payer: Self-pay | Admitting: Gerontology

## 2020-07-25 DIAGNOSIS — Z8659 Personal history of other mental and behavioral disorders: Secondary | ICD-10-CM

## 2020-07-25 MED ORDER — SERTRALINE HCL 50 MG PO TABS: 150.0000 mg | ORAL_TABLET | Freq: Every day | ORAL | 2 refills | Status: DC

## 2020-07-25 MED ORDER — MIRTAZAPINE 15 MG PO TABS: 15.0000 mg | ORAL_TABLET | Freq: Every day | ORAL | 2 refills | Status: DC

## 2020-07-25 NOTE — Telephone Encounter (Signed)
Provided 2022 proof of income.  Approved to receive medication assistance at [facility] until time for re-certification in 8721, and as long as eligibility criteria continues to be met.  [facility] [phone]

## 2020-08-05 ENCOUNTER — Telehealth: Payer: Self-pay | Admitting: Pharmacist

## 2020-08-05 NOTE — Telephone Encounter (Signed)
08/05/2020 9:48:02 AM - ProAir HFA faxed to [place] - Monday, August 05, 2020 9:47 AM --James Ivanoff application for ProAir HFA Inhale 2 puffs into the lung every 4 hours as needed-replaces Ventolin HFA.    08/05/2020 9:47:00 AM - Advair 250/50 to [place] - Monday, August 05, 2020 9:46 AM --Faxed [employer] application for Advair 867/54 Inhale 1 puff into the lungs twice daily.

## 2020-08-07 ENCOUNTER — Other Ambulatory Visit: Payer: Self-pay

## 2020-08-07 ENCOUNTER — Ambulatory Visit: Payer: Self-pay | Admitting: Gerontology

## 2020-08-07 ENCOUNTER — Other Ambulatory Visit: Payer: Self-pay | Admitting: Gerontology

## 2020-08-07 ENCOUNTER — Encounter: Payer: Self-pay | Admitting: Gerontology

## 2020-08-07 VITALS — BP 125/71 | HR 77 | Temp 97.3°F | Resp 16 | Wt 163.6 lb

## 2020-08-07 DIAGNOSIS — E785 Hyperlipidemia, unspecified: Secondary | ICD-10-CM

## 2020-08-07 DIAGNOSIS — E119 Type 2 diabetes mellitus without complications: Secondary | ICD-10-CM

## 2020-08-07 DIAGNOSIS — I10 Essential (primary) hypertension: Secondary | ICD-10-CM

## 2020-08-07 DIAGNOSIS — Z8719 Personal history of other diseases of the digestive system: Secondary | ICD-10-CM

## 2020-08-07 MED ORDER — PANTOPRAZOLE SODIUM 40 MG PO TBEC: 40.0000 mg | DELAYED_RELEASE_TABLET | Freq: Every day | ORAL | 3 refills | Status: DC

## 2020-08-07 MED ORDER — GLIPIZIDE 5 MG PO TABS: ORAL_TABLET | ORAL | 1 refills | Status: DC

## 2020-08-07 MED ORDER — FENOFIBRATE 50 MG PO CAPS: 50.0000 mg | ORAL_CAPSULE | Freq: Every day | ORAL | 0 refills | Status: DC

## 2020-08-07 MED ORDER — LISINOPRIL 5 MG PO TABS
5.0000 mg | ORAL_TABLET | Freq: Every day | ORAL | 2 refills | Status: DC
Start: 2020-08-07 — End: 2020-08-07

## 2020-08-07 NOTE — Patient Instructions (Signed)
Heart-Healthy Eating Plan Heart-healthy meal planning includes:  Eating less unhealthy fats.  Eating more healthy fats.  Making other changes in your diet. Talk with your doctor or a diet specialist (dietitian) to create an eating plan that is right for you. What is my plan? Your doctor may recommend an eating plan that includes:  Total fat: ______% or less of total calories a day.  Saturated fat: ______% or less of total calories a day.  Cholesterol: less than _________mg a day. What are tips for following this plan? Cooking Avoid frying your food. Try to bake, boil, grill, or broil it instead. You can also reduce fat by:  Removing the skin from poultry.  Removing all visible fats from meats.  Steaming vegetables in water or broth. Meal planning  At meals, divide your plate into four equal parts: ? Fill one-half of your plate with vegetables and green salads. ? Fill one-fourth of your plate with whole grains. ? Fill one-fourth of your plate with lean protein foods.  Eat 4-5 servings of vegetables per day. A serving of vegetables is: ? 1 cup of raw or cooked vegetables. ? 2 cups of raw leafy greens.  Eat 4-5 servings of fruit per day. A serving of fruit is: ? 1 medium whole fruit. ?  cup of dried fruit. ?  cup of fresh, frozen, or canned fruit. ?  cup of 100% fruit juice.  Eat more foods that have soluble fiber. These are apples, broccoli, carrots, beans, peas, and barley. Try to get 20-30 g of fiber per day.  Eat 4-5 servings of nuts, legumes, and seeds per week: ? 1 serving of dried beans or legumes equals  cup after being cooked. ? 1 serving of nuts is  cup. ? 1 serving of seeds equals 1 tablespoon.   General information  Eat more home-cooked food. Eat less restaurant, buffet, and fast food.  Limit or avoid alcohol.  Limit foods that are high in starch and sugar.  Avoid fried foods.  Lose weight if you are overweight.  Keep track of how much salt  (sodium) you eat. This is important if you have high blood pressure. Ask your doctor to tell you more about this.  Try to add vegetarian meals each week. Fats  Choose healthy fats. These include olive oil and canola oil, flaxseeds, walnuts, almonds, and seeds.  Eat more omega-3 fats. These include salmon, mackerel, sardines, tuna, flaxseed oil, and ground flaxseeds. Try to eat fish at least 2 times each week.  Check food labels. Avoid foods with trans fats or high amounts of saturated fat.  Limit saturated fats. ? These are often found in animal products, such as meats, butter, and cream. ? These are also found in plant foods, such as palm oil, palm kernel oil, and coconut oil.  Avoid foods with partially hydrogenated oils in them. These have trans fats. Examples are stick margarine, some tub margarines, cookies, crackers, and other baked goods. What foods can I eat? Fruits All fresh, canned (in natural juice), or frozen fruits. Vegetables Fresh or frozen vegetables (raw, steamed, roasted, or grilled). Green salads. Grains Most grains. Choose whole wheat and whole grains most of the time. Rice and pasta, including brown rice and pastas made with whole wheat. Meats and other proteins Lean, well-trimmed beef, veal, pork, and lamb. Chicken and turkey without skin. All fish and shellfish. Wild duck, rabbit, pheasant, and venison. Egg whites or low-cholesterol egg substitutes. Dried beans, peas, lentils, and tofu. Seeds and   most nuts. Dairy Low-fat or nonfat cheeses, including ricotta and mozzarella. Skim or 1% milk that is liquid, powdered, or evaporated. Buttermilk that is made with low-fat milk. Nonfat or low-fat yogurt. Fats and oils Non-hydrogenated (trans-free) margarines. Vegetable oils, including soybean, sesame, sunflower, olive, peanut, safflower, corn, canola, and cottonseed. Salad dressings or mayonnaise made with a vegetable oil. Beverages Mineral water. Coffee and tea. Diet  carbonated beverages. Sweets and desserts Sherbet, gelatin, and fruit ice. Small amounts of dark chocolate. Limit all sweets and desserts. Seasonings and condiments All seasonings and condiments. The items listed above may not be a complete list of foods and drinks you can eat. Contact a dietitian for more options. What foods should I avoid? Fruits Canned fruit in heavy syrup. Fruit in cream or butter sauce. Fried fruit. Limit coconut. Vegetables Vegetables cooked in cheese, cream, or butter sauce. Fried vegetables. Grains Breads that are made with saturated or trans fats, oils, or whole milk. Croissants. Sweet rolls. Donuts. High-fat crackers, such as cheese crackers. Meats and other proteins Fatty meats, such as hot dogs, ribs, sausage, bacon, rib-eye roast or steak. High-fat deli meats, such as salami and bologna. Caviar. Domestic duck and goose. Organ meats, such as liver. Dairy Cream, sour cream, cream cheese, and creamed cottage cheese. Whole-milk cheeses. Whole or 2% milk that is liquid, evaporated, or condensed. Whole buttermilk. Cream sauce or high-fat cheese sauce. Yogurt that is made from whole milk. Fats and oils Meat fat, or shortening. Cocoa butter, hydrogenated oils, palm oil, coconut oil, palm kernel oil. Solid fats and shortenings, including bacon fat, salt pork, lard, and butter. Nondairy cream substitutes. Salad dressings with cheese or sour cream. Beverages Regular sodas and juice drinks with added sugar. Sweets and desserts Frosting. Pudding. Cookies. Cakes. Pies. Milk chocolate or white chocolate. Buttered syrups. Full-fat ice cream or ice cream drinks. The items listed above may not be a complete list of foods and drinks to avoid. Contact a dietitian for more information. Summary  Heart-healthy meal planning includes eating less unhealthy fats, eating more healthy fats, and making other changes in your diet.  Eat a balanced diet. This includes fruits and  vegetables, low-fat or nonfat dairy, lean protein, nuts and legumes, whole grains, and heart-healthy oils and fats. This information is not intended to replace advice given to you by your health care provider. Make sure you discuss any questions you have with your health care provider. Document Revised: 08/05/2017 Document Reviewed: 07/09/2017 Elsevier Patient Education  2021 [place]. https://www.diabeteseducator.org/docs/default-source/living-with-diabetes/conquering-the-grocery-store-v1.pdf?sfvrsn=4">  Carbohydrate Counting for Diabetes Mellitus, Adult Carbohydrate counting is a method of keeping track of how many carbohydrates you eat. Eating carbohydrates naturally increases the amount of sugar (glucose) in the blood. Counting how many carbohydrates you eat improves your blood glucose control, which helps you manage your diabetes. It is important to know how many carbohydrates you can safely have in each meal. This is different for every person. A dietitian can help you make a meal plan and calculate how many carbohydrates you should have at each meal and snack. What foods contain carbohydrates? Carbohydrates are found in the following foods:  Grains, such as breads and cereals.  Dried beans and soy products.  Starchy vegetables, such as potatoes, peas, and corn.  Fruit and fruit juices.  Milk and yogurt.  Sweets and snack foods, such as cake, cookies, candy, chips, and soft drinks.   How do I count carbohydrates in foods? There are two ways to count carbohydrates in food. You can read  food labels or learn standard serving sizes of foods. You can use either of the methods or a combination of both. Using the Nutrition Facts label The Nutrition Facts list is included on the labels of almost all packaged foods and beverages in the [place] It includes:  The serving size.  Information about nutrients in each serving, including the grams (g) of carbohydrate per serving. To use the  Nutrition Facts:  Decide how many servings you will have.  Multiply the number of servings by the number of carbohydrates per serving.  The resulting number is the total amount of carbohydrates that you will be having. Learning the standard serving sizes of foods When you eat carbohydrate foods that are not packaged or do not include Nutrition Facts on the label, you need to measure the servings in order to count the amount of carbohydrates.  Measure the foods that you will eat with a food scale or measuring cup, if needed.  Decide how many standard-size servings you will eat.  Multiply the number of servings by 15. For foods that contain carbohydrates, one serving equals 15 g of carbohydrates. ? For example, if you eat 2 cups or 10 oz (300 g) of strawberries, you will have eaten 2 servings and 30 g of carbohydrates (2 servings x 15 g = 30 g).  For foods that have more than one food mixed, such as soups and casseroles, you must count the carbohydrates in each food that is included. The following list contains standard serving sizes of common carbohydrate-rich foods. Each of these servings has about 15 g of carbohydrates:  1 slice of bread.  1 six-inch (15 cm) tortilla.  ? cup or 2 oz (53 g) cooked rice or pasta.   cup or 3 oz (85 g) cooked or canned, drained and rinsed beans or lentils.   cup or 3 oz (85 g) starchy vegetable, such as peas, corn, or squash.   cup or 4 oz (120 g) hot cereal.   cup or 3 oz (85 g) boiled or mashed potatoes, or  or 3 oz (85 g) of a large baked potato.   cup or 4 fl oz (118 mL) fruit juice.  1 cup or 8 fl oz (237 mL) milk.  1 small or 4 oz (106 g) apple.   or 2 oz (63 g) of a medium banana.  1 cup or 5 oz (150 g) strawberries.  3 cups or 1 oz (24 g) popped popcorn. What is an example of carbohydrate counting? To calculate the number of carbohydrates in this sample meal, follow the steps shown below. Sample meal  3 oz (85 g) chicken  breast.  ? cup or 4 oz (106 g) brown rice.   cup or 3 oz (85 g) corn.  1 cup or 8 fl oz (237 mL) milk.  1 cup or 5 oz (150 g) strawberries with sugar-free whipped topping. Carbohydrate calculation 1. Identify the foods that contain carbohydrates: ? Rice. ? Corn. ? Milk. ? Strawberries. 2. Calculate how many servings you have of each food: ? 2 servings rice. ? 1 serving corn. ? 1 serving milk. ? 1 serving strawberries. 3. Multiply each number of servings by 15 g: ? 2 servings rice x 15 g = 30 g. ? 1 serving corn x 15 g = 15 g. ? 1 serving milk x 15 g = 15 g. ? 1 serving strawberries x 15 g = 15 g. 4. Add together all of the amounts to find the  total grams of carbohydrates eaten: ? 30 g + 15 g + 15 g + 15 g = 75 g of carbohydrates total. What are tips for following this plan? Shopping  Develop a meal plan and then make a shopping list.  Buy fresh and frozen vegetables, fresh and frozen fruit, dairy, eggs, beans, lentils, and whole grains.  Look at food labels. Choose foods that have more fiber and less sugar.  Avoid processed foods and foods with added sugars. Meal planning  Aim to have the same amount of carbohydrates at each meal and for each snack time.  Plan to have regular, balanced meals and snacks. Where to find more information  American Diabetes Association: www.diabetes.org  Centers for Disease Control and Prevention: http://www.wolf.info/ Summary  Carbohydrate counting is a method of keeping track of how many carbohydrates you eat.  Eating carbohydrates naturally increases the amount of sugar (glucose) in the blood.  Counting how many carbohydrates you eat improves your blood glucose control, which helps you manage your diabetes.  A dietitian can help you make a meal plan and calculate how many carbohydrates you should have at each meal and snack. This information is not intended to replace advice given to you by your health care provider. Make sure you discuss  any questions you have with your health care provider. Document Revised: 06/01/2019 Document Reviewed: 06/02/2019 Elsevier Patient Education  2021 [place]. [email or web address]">  DASH Eating Plan DASH stands for Dietary Approaches to Stop Hypertension. The DASH eating plan is a healthy eating plan that has been shown to:  Reduce high blood pressure (hypertension).  Reduce your risk for type 2 diabetes, heart disease, and stroke.  Help with weight loss. What are tips for following this plan? Reading food labels  Check food labels for the amount of salt (sodium) per serving. Choose foods with less than 5 percent of the Daily Value of sodium. Generally, foods with less than 300 milligrams (mg) of sodium per serving fit into this eating plan.  To find whole grains, look for the word "whole" as the first word in the ingredient list. Shopping  Buy products labeled as "low-sodium" or "no salt added."  Buy fresh foods. Avoid canned foods and pre-made or frozen meals. Cooking  Avoid adding salt when cooking. Use salt-free seasonings or herbs instead of table salt or sea salt. Check with your health care provider or pharmacist before using salt substitutes.  Do not fry foods. Cook foods using healthy methods such as baking, boiling, grilling, roasting, and broiling instead.  Cook with heart-healthy oils, such as olive, canola, avocado, soybean, or sunflower oil. Meal planning  Eat a balanced diet that includes: ? 4 or more servings of fruits and 4 or more servings of vegetables each day. Try to fill one-half of your plate with fruits and vegetables. ? 6-8 servings of whole grains each day. ? Less than 6 oz (170 g) of lean meat, poultry, or fish each day. A 3-oz (85-g) serving of meat is about the same size as a deck of cards. One egg equals 1 oz (28 g). ? 2-3 servings of low-fat dairy each day. One serving is 1 cup (237 mL). ? 1 serving of  nuts, seeds, or beans 5 times each week. ? 2-3 servings of heart-healthy fats. Healthy fats called omega-3 fatty acids are found in foods such as walnuts, flaxseeds, fortified milks, and eggs. These fats are also found in cold-water fish, such as sardines, salmon, and mackerel.  Limit how much  you eat of: ? Canned or prepackaged foods. ? Food that is high in trans fat, such as some fried foods. ? Food that is high in saturated fat, such as fatty meat. ? Desserts and other sweets, sugary drinks, and other foods with added sugar. ? Full-fat dairy products.  Do not salt foods before eating.  Do not eat more than 4 egg yolks a week.  Try to eat at least 2 vegetarian meals a week.  Eat more home-cooked food and less restaurant, buffet, and fast food.   Lifestyle  When eating at a restaurant, ask that your food be prepared with less salt or no salt, if possible.  If you drink alcohol: ? Limit how much you use to:  0-1 drink a day for women who are not pregnant.  0-2 drinks a day for men. ? Be aware of how much alcohol is in your drink. In the [place] one drink equals one 12 oz bottle of beer (355 mL), one 5 oz glass of wine (148 mL), or one 1 oz glass of hard liquor (44 mL). General information  Avoid eating more than 2,300 mg of salt a day. If you have hypertension, you may need to reduce your sodium intake to 1,500 mg a day.  Work with your health care provider to maintain a healthy body weight or to lose weight. Ask what an ideal weight is for you.  Get at least 30 minutes of exercise that causes your heart to beat faster (aerobic exercise) most days of the week. Activities may include walking, swimming, or biking.  Work with your health care provider or dietitian to adjust your eating plan to your individual calorie needs. What foods should I eat? Fruits All fresh, dried, or frozen fruit. Canned fruit in natural juice (without added sugar). Vegetables Fresh or frozen vegetables  (raw, steamed, roasted, or grilled). Low-sodium or reduced-sodium tomato and vegetable juice. Low-sodium or reduced-sodium tomato sauce and tomato paste. Low-sodium or reduced-sodium canned vegetables. Grains Whole-grain or whole-wheat bread. Whole-grain or whole-wheat pasta. Brown rice. [place]. Bulgur. Whole-grain and low-sodium cereals. Pita bread. Low-fat, low-sodium crackers. Whole-wheat flour tortillas. Meats and other proteins Skinless chicken or [place]. Ground chicken or [place]. Pork with fat trimmed off. Fish and seafood. Egg whites. Dried beans, peas, or lentils. Unsalted nuts, nut butters, and seeds. Unsalted canned beans. Lean cuts of beef with fat trimmed off. Low-sodium, lean precooked or cured meat, such as sausages or meat loaves. Dairy Low-fat (1%) or fat-free (skim) milk. Reduced-fat, low-fat, or fat-free cheeses. Nonfat, low-sodium ricotta or cottage cheese. Low-fat or nonfat yogurt. Low-fat, low-sodium cheese. Fats and oils Soft margarine without trans fats. Vegetable oil. Reduced-fat, low-fat, or light mayonnaise and salad dressings (reduced-sodium). Canola, safflower, olive, avocado, soybean, and sunflower oils. Avocado. Seasonings and condiments Herbs. Spices. Seasoning mixes without salt. Other foods Unsalted popcorn and pretzels. Fat-free sweets. The items listed above may not be a complete list of foods and beverages you can eat. Contact a dietitian for more information. What foods should I avoid? Fruits Canned fruit in a light or heavy syrup. Fried fruit. Fruit in cream or butter sauce. Vegetables Creamed or fried vegetables. Vegetables in a cheese sauce. Regular canned vegetables (not low-sodium or reduced-sodium). Regular canned tomato sauce and paste (not low-sodium or reduced-sodium). Regular tomato and vegetable juice (not low-sodium or reduced-sodium). Angie Fava. Olives. Grains Baked goods made with fat, such as croissants, muffins, or some breads. Dry pasta  or rice meal packs. Meats and other proteins  Fatty cuts of meat. Ribs. Fried meat. Berniece Salines. Bologna, salami, and other precooked or cured meats, such as sausages or meat loaves. Fat from the back of a pig (fatback). Bratwurst. Salted nuts and seeds. Canned beans with added salt. Canned or smoked fish. Whole eggs or egg yolks. Chicken or [place] with skin. Dairy Whole or 2% milk, cream, and half-and-half. Whole or full-fat cream cheese. Whole-fat or sweetened yogurt. Full-fat cheese. Nondairy creamers. Whipped toppings. Processed cheese and cheese spreads. Fats and oils Butter. Stick margarine. Lard. Shortening. Ghee. Bacon fat. Tropical oils, such as coconut, palm kernel, or palm oil. Seasonings and condiments Onion salt, garlic salt, seasoned salt, table salt, and sea salt. Worcestershire sauce. Tartar sauce. Barbecue sauce. Teriyaki sauce. Soy sauce, including reduced-sodium. Steak sauce. Canned and packaged gravies. Fish sauce. Oyster sauce. Cocktail sauce. Store-bought horseradish. Ketchup. Mustard. Meat flavorings and tenderizers. Bouillon cubes. Hot sauces. Pre-made or packaged marinades. Pre-made or packaged taco seasonings. Relishes. Regular salad dressings. Other foods Salted popcorn and pretzels. The items listed above may not be a complete list of foods and beverages you should avoid. Contact a dietitian for more information. Where to find more information  National Heart, Lung, and Blood Institute: https://wilson-eaton.com/  American Heart Association: www.heart.org  Academy of Nutrition and Dietetics: www.eatright.[place]: www.kidney.org Summary  The DASH eating plan is a healthy eating plan that has been shown to reduce high blood pressure (hypertension). It may also reduce your risk for type 2 diabetes, heart disease, and stroke.  When on the DASH eating plan, aim to eat more fresh fruits and vegetables, whole grains, lean proteins, low-fat dairy, and  heart-healthy fats.  With the DASH eating plan, you should limit salt (sodium) intake to 2,300 mg a day. If you have hypertension, you may need to reduce your sodium intake to 1,500 mg a day.  Work with your health care provider or dietitian to adjust your eating plan to your individual calorie needs. This information is not intended to replace advice given to you by your health care provider. Make sure you discuss any questions you have with your health care provider. Document Revised: 05/05/2019 Document Reviewed: 05/05/2019 Elsevier Patient Education  2021 [employer].
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
## 2020-08-10 ENCOUNTER — Other Ambulatory Visit: Payer: Self-pay | Admitting: Gerontology

## 2020-08-10 DIAGNOSIS — E785 Hyperlipidemia, unspecified: Secondary | ICD-10-CM

## 2020-08-10 MED ORDER — FENOFIBRATE 48 MG PO TABS: 48.0000 mg | ORAL_TABLET | Freq: Every day | ORAL | 0 refills | Status: DC

## 2020-08-13 ENCOUNTER — Ambulatory Visit: Payer: Self-pay | Admitting: Licensed Clinical Social Worker

## 2020-08-13 ENCOUNTER — Other Ambulatory Visit: Payer: Self-pay

## 2020-08-13 DIAGNOSIS — F331 Major depressive disorder, recurrent, moderate: Secondary | ICD-10-CM
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
## 2020-08-14 ENCOUNTER — Ambulatory Visit: Payer: Self-pay

## 2020-09-05 ENCOUNTER — Encounter: Payer: Self-pay | Admitting: Gerontology

## 2020-09-05 ENCOUNTER — Other Ambulatory Visit: Payer: Self-pay | Admitting: Gerontology

## 2020-09-05 ENCOUNTER — Ambulatory Visit: Payer: Self-pay | Admitting: Gerontology

## 2020-09-05 ENCOUNTER — Other Ambulatory Visit: Payer: Self-pay

## 2020-09-05 VITALS — BP 144/76 | HR 87 | Temp 98.1°F | Resp 16 | Ht 64.0 in | Wt 164.0 lb

## 2020-09-05 DIAGNOSIS — R197 Diarrhea, unspecified: Secondary | ICD-10-CM

## 2020-09-05 DIAGNOSIS — E119 Type 2 diabetes mellitus without complications: Secondary | ICD-10-CM

## 2020-09-05 DIAGNOSIS — Z0000 Encounter for general adult medical examination without abnormal findings: Secondary | ICD-10-CM

## 2020-09-05 DIAGNOSIS — E785 Hyperlipidemia, unspecified: Secondary | ICD-10-CM

## 2020-09-05 DIAGNOSIS — R110 Nausea: Secondary | ICD-10-CM

## 2020-09-05 MED ORDER — ONDANSETRON HCL 4 MG PO TABS: 4.0000 mg | ORAL_TABLET | Freq: Three times a day (TID) | ORAL | 0 refills | Status: DC | PRN

## 2020-09-05 NOTE — Patient Instructions (Signed)
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
## 2020-09-11 ENCOUNTER — Ambulatory Visit: Payer: Self-pay | Admitting: Licensed Clinical Social Worker

## 2020-09-11 ENCOUNTER — Telehealth: Payer: Self-pay | Admitting: Licensed Clinical Social Worker

## 2020-09-11 NOTE — Telephone Encounter (Signed)
Called the patient twice during today's scheduled appointment; no answer, left a voicemail with the clinic contact information so they may reschedule.   

## 2020-09-16 ENCOUNTER — Other Ambulatory Visit: Payer: Self-pay

## 2020-09-17 ENCOUNTER — Other Ambulatory Visit: Payer: Self-pay

## 2020-09-23 ENCOUNTER — Other Ambulatory Visit: Payer: Self-pay

## 2020-09-23 MED FILL — Sertraline HCl Tab 50 MG: ORAL | 30 days supply | Qty: 90 | Fill #0 | Status: AC

## 2020-09-23 MED FILL — Glipizide Tab 5 MG: ORAL | 30 days supply | Qty: 60 | Fill #0 | Status: CN

## 2020-09-23 MED FILL — Lisinopril Tab 5 MG: ORAL | 30 days supply | Qty: 30 | Fill #0 | Status: AC

## 2020-09-25 ENCOUNTER — Other Ambulatory Visit: Payer: Self-pay

## 2020-09-25 MED FILL — Glipizide Tab 5 MG: ORAL | 30 days supply | Qty: 45 | Fill #0 | Status: AC

## 2020-09-26 ENCOUNTER — Telehealth: Payer: Self-pay | Admitting: Pharmacist

## 2020-09-26 ENCOUNTER — Other Ambulatory Visit: Payer: Self-pay

## 2020-09-26 MED FILL — Mirtazapine Tab 15 MG: ORAL | 30 days supply | Qty: 30 | Fill #0 | Status: AC

## 2020-09-26 NOTE — Telephone Encounter (Signed)
09/26/2020 12:42:49 PM - Advair refill online with [facility] - Thursday, September 26, 2020 12:42 PM -- Placed refill online with [facility] for Advair, order# [id number], allow 7-10 business days to receive.

## 2020-09-27 ENCOUNTER — Other Ambulatory Visit: Payer: Self-pay

## 2020-09-30 ENCOUNTER — Other Ambulatory Visit: Payer: Self-pay

## 2020-10-01 ENCOUNTER — Other Ambulatory Visit: Payer: Self-pay | Admitting: Internal Medicine

## 2020-10-01 ENCOUNTER — Other Ambulatory Visit: Payer: Self-pay

## 2020-10-01 DIAGNOSIS — Z8709 Personal history of other diseases of the respiratory system: Secondary | ICD-10-CM

## 2020-10-01 MED FILL — Albuterol Sulfate Inhal Aero 108 MCG/ACT (90MCG Base Equiv): RESPIRATORY_TRACT | 30 days supply | Qty: 8.5 | Fill #0 | Status: AC

## 2020-10-02 ENCOUNTER — Other Ambulatory Visit: Payer: Self-pay

## 2020-10-02 ENCOUNTER — Encounter: Payer: Self-pay | Admitting: *Deleted

## 2020-10-03 ENCOUNTER — Ambulatory Visit: Payer: Self-pay | Admitting: Gerontology

## 2020-10-16 ENCOUNTER — Other Ambulatory Visit: Payer: Self-pay

## 2020-10-16 DIAGNOSIS — R197 Diarrhea, unspecified: Secondary | ICD-10-CM

## 2020-10-16 DIAGNOSIS — E785 Hyperlipidemia, unspecified: Secondary | ICD-10-CM

## 2020-10-17 LAB — LIPID PANEL
Chol/HDL Ratio: 5.8 ratio — ABNORMAL HIGH (ref 0.0–4.4)
Cholesterol, Total: 214 mg/dL — ABNORMAL HIGH (ref 100–199)
HDL: 37 mg/dL — ABNORMAL LOW (ref >39)
LDL Chol Calc (NIH): 144 mg/dL — ABNORMAL HIGH (ref 0–99)
Triglycerides: 182 mg/dL — ABNORMAL HIGH (ref 0–149)
VLDL Cholesterol Cal: 33 mg/dL (ref 5–40)

## 2020-10-17 LAB — COMPREHENSIVE METABOLIC PANEL
ALT: 29 IU/L (ref 0–32)
AST: 34 IU/L (ref 0–40)
Albumin/Globulin Ratio: 1.8 (ref 1.2–2.2)
Albumin: 4.6 g/dL (ref 3.8–4.8)
Alkaline Phosphatase: 100 IU/L (ref 44–121)
BUN/Creatinine Ratio: 15 (ref 12–28)
BUN: 18 mg/dL (ref 8–27)
Bilirubin Total: 0.4 mg/dL (ref 0.0–1.2)
CO2: 20 mmol/L (ref 20–29)
Calcium: 9.5 mg/dL (ref 8.7–10.3)
Chloride: 103 mmol/L (ref 96–106)
Creatinine, Ser: 1.21 mg/dL — ABNORMAL HIGH (ref 0.57–1.00)
Globulin, Total: 2.5 g/dL (ref 1.5–4.5)
Glucose: 192 mg/dL — ABNORMAL HIGH (ref 65–99)
Potassium: 4.1 mmol/L (ref 3.5–5.2)
Sodium: 140 mmol/L (ref 134–144)
Total Protein: 7.1 g/dL (ref 6.0–8.5)
eGFR: 50 mL/min/{1.73_m2} — ABNORMAL LOW (ref >59)

## 2020-10-17 LAB — CBC WITH DIFFERENTIAL/PLATELET
Basophils Absolute: 0.1 10*3/uL (ref 0.0–0.2)
Basos: 1 %
EOS (ABSOLUTE): 0.4 10*3/uL (ref 0.0–0.4)
Eos: 3 %
Hematocrit: 41.2 % (ref 34.0–46.6)
Hemoglobin: 13.9 g/dL (ref 11.1–15.9)
Immature Grans (Abs): 0 10*3/uL (ref 0.0–0.1)
Immature Granulocytes: 0 %
Lymphocytes Absolute: 3.8 10*3/uL — ABNORMAL HIGH (ref 0.7–3.1)
Lymphs: 25 %
MCH: 29.8 pg (ref 26.6–33.0)
MCHC: 33.7 g/dL (ref 31.5–35.7)
MCV: 88 fL (ref 79–97)
Monocytes Absolute: 0.8 10*3/uL (ref 0.1–0.9)
Monocytes: 5 %
Neutrophils Absolute: 9.9 10*3/uL — ABNORMAL HIGH (ref 1.4–7.0)
Neutrophils: 66 %
Platelets: 244 10*3/uL (ref 150–450)
RBC: 4.67 x10E6/uL (ref 3.77–5.28)
RDW: 13.6 % (ref 11.7–15.4)
WBC: 15.1 10*3/uL — ABNORMAL HIGH (ref 3.4–10.8)

## 2020-10-19 ENCOUNTER — Other Ambulatory Visit: Payer: Self-pay | Admitting: Gerontology

## 2020-10-19 DIAGNOSIS — E785 Hyperlipidemia, unspecified: Secondary | ICD-10-CM

## 2020-10-20 ENCOUNTER — Other Ambulatory Visit: Payer: Self-pay | Admitting: Gerontology

## 2020-10-20 DIAGNOSIS — E785 Hyperlipidemia, unspecified: Secondary | ICD-10-CM

## 2020-10-21 ENCOUNTER — Other Ambulatory Visit: Payer: Self-pay

## 2020-10-23 ENCOUNTER — Ambulatory Visit: Payer: Self-pay | Admitting: Gerontology

## 2020-10-31 ENCOUNTER — Other Ambulatory Visit: Payer: Self-pay | Admitting: Gerontology

## 2020-10-31 ENCOUNTER — Other Ambulatory Visit: Payer: Self-pay

## 2020-10-31 ENCOUNTER — Ambulatory Visit: Payer: Self-pay | Admitting: Gerontology

## 2020-10-31 MED ORDER — ALBUTEROL SULFATE HFA 108 (90 BASE) MCG/ACT IN AERS
INHALATION_SPRAY | RESPIRATORY_TRACT | 3 refills | Status: AC
  Filled 2020-10-31: qty 17, 33d supply, fill #0
  Filled 2020-12-11: qty 25.5, 50d supply, fill #1

## 2020-11-01 ENCOUNTER — Other Ambulatory Visit: Payer: Self-pay

## 2020-11-06 ENCOUNTER — Other Ambulatory Visit: Payer: Self-pay | Admitting: Gerontology

## 2020-11-06 ENCOUNTER — Other Ambulatory Visit: Payer: Self-pay

## 2020-11-06 DIAGNOSIS — E785 Hyperlipidemia, unspecified: Secondary | ICD-10-CM

## 2020-11-06 MED ORDER — FENOFIBRATE 48 MG PO TABS
ORAL_TABLET | Freq: Every day | ORAL | 0 refills | Status: DC
  Filled 2020-11-06: qty 14, 14d supply, fill #0

## 2020-11-07 ENCOUNTER — Other Ambulatory Visit: Payer: Self-pay

## 2020-11-18 ENCOUNTER — Other Ambulatory Visit: Payer: Self-pay | Admitting: Gerontology

## 2020-11-18 ENCOUNTER — Other Ambulatory Visit: Payer: Self-pay

## 2020-11-18 DIAGNOSIS — Z8659 Personal history of other mental and behavioral disorders: Secondary | ICD-10-CM

## 2020-11-18 MED FILL — Glipizide Tab 5 MG: ORAL | 30 days supply | Qty: 45 | Fill #1 | Status: AC

## 2020-11-18 MED FILL — Sertraline HCl Tab 50 MG: ORAL | 30 days supply | Qty: 90 | Fill #1 | Status: AC

## 2020-11-18 MED FILL — Lisinopril Tab 5 MG: ORAL | 30 days supply | Qty: 30 | Fill #1 | Status: AC

## 2020-11-19 ENCOUNTER — Telehealth: Payer: Self-pay | Admitting: Licensed Clinical Social Worker

## 2020-11-19 ENCOUNTER — Other Ambulatory Visit: Payer: Self-pay

## 2020-11-19 NOTE — Telephone Encounter (Signed)
Called the patient to schedule a 60 minute telephone follow-up visit. No answer; left voicemail with clinic contact information

## 2020-11-28 ENCOUNTER — Ambulatory Visit: Payer: Self-pay | Admitting: Licensed Clinical Social Worker

## 2020-11-28 ENCOUNTER — Other Ambulatory Visit: Payer: Self-pay | Admitting: Gerontology

## 2020-11-28 ENCOUNTER — Other Ambulatory Visit: Payer: Self-pay

## 2020-11-28 ENCOUNTER — Encounter: Payer: Self-pay | Admitting: Licensed Clinical Social Worker

## 2020-11-28 DIAGNOSIS — F411 Generalized anxiety disorder: Secondary | ICD-10-CM

## 2020-11-28 DIAGNOSIS — F331 Major depressive disorder, recurrent, moderate: Secondary | ICD-10-CM

## 2020-11-28 DIAGNOSIS — Z8659 Personal history of other mental and behavioral disorders: Secondary | ICD-10-CM

## 2020-11-28 MED ORDER — SERTRALINE HCL 50 MG PO TABS
ORAL_TABLET | Freq: Every day | ORAL | 2 refills | Status: DC
  Filled 2020-11-28: qty 90, fill #0

## 2020-11-28 MED ORDER — MIRTAZAPINE 15 MG PO TABS
ORAL_TABLET | Freq: Every day | ORAL | 2 refills | Status: DC
  Filled 2020-11-28: qty 30, 30d supply, fill #0

## 2020-11-28 NOTE — BH Specialist Note (Signed)
Integrated Behavioral Health Follow Up In-Person Visit  MRN: [id number] Name: Karla Carter  Total time: 60 minutes  Types of Service: Telephone visit Patient consents to telephone visit and 2 patient identifiers were used to identify patient   Interpretor:No. Interpretor Name and Language:  Subjective: Karla Carter is a 65 y.o. female accompanied by  herself Patient was referred by Merrilyn Puma for mental health. Patient reports the following symptoms/concerns: The patient reports that she has been doing about the same since her last follow-up visit. She stated that she is currently enjoying some time to herself as her son and his family are on vacation. She noted that the quiet house did make her feel a little on edge though. She stated that she will have medicare next month and that she is sad that she can not be seen here at the [facility]. She explained that it is hard for her to open up to strangers, and she is unsure if she can start over with a new therapist. She discussed financial stressors that are impacting her life. She discussed relationship stressors and identified several coping skills she utilizes to deal with her current circumstances. The patient reminisced about her childhood and raising her children. She noted that she has been able to make it through some very difficult times in her life with the love and assistance of her dad and her son.The patient denies any suicidal or homicidal thoughts.   Severity of problem: moderate  Objective: Mood: Euthymic and Affect: Appropriate Risk of harm to self or others: No plan to harm self or others  Life Context: Family and Social: see above School/Work: see above Self-Care: see above Life Changes: see above  Patient and/or Family's Strengths/Protective Factors: Concrete supports in place (healthy food, safe environments, etc.)  Goals Addressed: Patient will:  Reduce symptoms of: agitation, anxiety,  depression, insomnia, and stress   Increase knowledge and/or ability of: coping skills, healthy habits, self-management skills, and stress reduction   Demonstrate ability to: Increase healthy adjustment to current life circumstances  Progress towards Goals: Ongoing  Interventions: Interventions utilized:  Supportive Counseling was utilized by the clinician during today's follow up session. The clinician processed with the patient how they have been doing since the last follow-up session. The clinician provided a space for the patient to ventilate their frustrations regarding their current life circumstances. Clinician measured the patient's anxiety and depression on a numerical scale. Clinician reassured patient that she would receive refills for her medications and referrals to other providers when she gets medicare next month and transitions away from [facility]. Clinician processed with their feelings regarding transitioning care. Clinician reassured the patient that they would work together to make the transition as smooth as possible. Clinician congratulated the patient on utilizing her coping skills and encouraged the patient to continue to practice self care.  Standardized Assessments completed: GAD-7 and PHQ 9 PHQ-9    16 GAD-7    15    Assessment: Patient currently experiencing see above.   Patient may benefit from see above.  Plan: Follow up with behavioral health clinician on : 12/13/2019 at 2:00 PM  Behavioral recommendations:  Referral(s): [place] (In Clinic) "From scale of 1-10, how likely are you to follow plan?":     Lesli Albee, LCSWA

## 2020-11-29 ENCOUNTER — Other Ambulatory Visit: Payer: Self-pay

## 2020-12-11 ENCOUNTER — Other Ambulatory Visit: Payer: Self-pay

## 2020-12-12 ENCOUNTER — Ambulatory Visit: Payer: Self-pay | Admitting: Licensed Clinical Social Worker

## 2020-12-12 ENCOUNTER — Other Ambulatory Visit: Payer: Self-pay

## 2020-12-12 DIAGNOSIS — F331 Major depressive disorder, recurrent, moderate: Secondary | ICD-10-CM

## 2020-12-12 DIAGNOSIS — F411 Generalized anxiety disorder: Secondary | ICD-10-CM

## 2020-12-12 NOTE — BH Specialist Note (Signed)
Integrated Behavioral Health Follow Up In-Person Visit  MRN: [id number] Name: Karla Carter   Total time: 60 minutes  Types of Service: Telephone visit Patient consents to telephone visit and 2 patient identifiers were used to identify patient  Interpretor:No. Interpretor Name and Language: N/A  Subjective: Karla Carter is a 65 y.o. female accompanied by  herself Patient was referred by Carlyon Shadow, NP  for Mental Health. Patient reports the following symptoms/concerns: The patient stated that she has been doing a bit better since her last follow-up session. She noted her son and his family have returned from their beach vacation and the house is alive and bustling with noise and activity again. She explained that she did not sleep last night and was just falling to sleep when the telephone rang. She explained that nothing in particular kept her up just her usual insomnia. She discussed her sleep routine. The patient asked questions regarding her upcoming transition to Medicare and referrals to other providers. She reported she did not want to go to any place that offered group therapy because she could not open up in front of many people. The patient discussed other situational stressors impacting her life. The patient denied any suicidal or homicidal thoughts.  Duration of problem: Years; Severity of problem: moderate  Objective: Mood: Euthymic and Affect: Appropriate Risk of harm to self or others: No plan to harm self or others  Life Context: Family and Social: see above School/Work: see above Self-Care: see above Life Changes: see above  Patient and/or Family's Strengths/Protective Factors: Concrete supports in place (healthy food, safe environments, etc.) and Sense of purpose  Goals Addressed: Patient will:  Reduce symptoms of: anxiety, depression, insomnia, and stress   Increase knowledge and/or ability of: coping skills, healthy habits, self-management skills, and  stress reduction   Demonstrate ability to: Increase healthy adjustment to current life circumstances  Progress towards Goals: Ongoing  Interventions: Interventions utilized:  Supportive Counseling was utilized by the clinician during today's follow up session. The clinician processed with the patient how they have been doing since the last follow-up session. The clinician provided a space for the patient to ventilate their frustrations regarding their current life circumstances. Clinician measured the patient's anxiety and depression on a numerical scale. Clinician continued processing with the patient her transition from the [facility] and the [facility] program. Clinician reassured the patient she would be supported through this time and be provided with referrals to other providers and receive appropriate medication refills. Clinician normalized the  patients concerns and emotions including her sense of loss regarding ending the relationships she has built with her current providers. Clinician encouraged the patient to practice good sleep hygiene and incorporate self care into her daily routine.  Standardized Assessments completed: GAD-7 and PHQ 9 GAD-7    10 PHQ-9      9  Assessment: Patient currently experiencing see above.   Patient may benefit from see above.  Plan: Follow up with behavioral health clinician on : 12/31/2020 at 6:00 PM Behavioral recommendations:  Referral(s): [place] (In Clinic) "From scale of 1-10, how likely are you to follow plan?":   Lesli Albee, LCSWA

## 2020-12-26 ENCOUNTER — Other Ambulatory Visit: Payer: Self-pay

## 2020-12-31 ENCOUNTER — Ambulatory Visit: Payer: Self-pay | Admitting: Licensed Clinical Social Worker

## 2020-12-31 DIAGNOSIS — F331 Major depressive disorder, recurrent, moderate: Secondary | ICD-10-CM

## 2020-12-31 DIAGNOSIS — F411 Generalized anxiety disorder: Secondary | ICD-10-CM

## 2020-12-31 NOTE — BH Specialist Note (Signed)
Integrated Behavioral Health Follow Up In-Person Visit  MRN: [id number] Name: Karla Carter  Total time: 60 minutes  Types of Service: Telephone visit Patient consents to telephone visit and 2 patient identifiers were used to identify patient   Interpretor:No. Interpretor Name and Language: N/A  Subjective: Karla Carter is a 65 y.o. female accompanied by  herself Patient was referred by Carlyon Shadow, NP  for mental health. Patient reports the following symptoms/concerns: The patient reports that she has been doing well since her last follow up session. She reports that she is feeling sad today because this will be her last session. She notes that she will miss the staff and providers at the [facility] and doesn't want to have to change providers. She noted that she feels very connected to her clinician and  is concerned about switching to someone else. The patient is worried other providers will not be as open or make her feel safe to be herself. She discussed familial stressor impacting her life. She noted her son was in a situation that hurt her but she was determined to set firm boundaries. She provided an example of a time she successfully used boundaries to deal with a painful situation and the positive outcome. She noted that she is crocheting for self care, watching re-runs of her favorite television programs, and taking quiet time for herself. The patient verbalized the progress she has made since starting therapy and reminisced about raising her children and her life experiences. She identified several personal strengths. She stated she felt like she was a bad mother at times but was able to explore and challenge that thought with minimal prompts. She noted that her medications were switched to [facility] and some of them are over $40.00 and she can not afford them. She explained that she is scared she won't be able to continue to receive her medications and she will get sick. The  patient denied any suicidal or homicidal thoughts Duration of problem: Years; Severity of problem: moderate  Objective: Mood: Euthymic and Affect: Appropriate Risk of harm to self or others: No plan to harm self or others  Life Context: Family and Social: see above School/Work: see above Self-Care: see above Life Changes: see above  Patient and/or Family's Strengths/Protective Factors: Concrete supports in place (healthy food, safe environments, etc.) and Sense of purpose  Goals Addressed: Patient will:  Reduce symptoms of: anxiety, depression, insomnia, and stress   Increase knowledge and/or ability of: coping skills, healthy habits, self-management skills, and stress reduction   Demonstrate ability to: Increase healthy adjustment to current life circumstances  Progress towards Goals: Other  Interventions: Interventions utilized:  CBT Cognitive Behavioral Therapy was utilized by the clinician during today's follow up session. The clinician processed with the patient how they have been doing since the last follow-up session. The clinician provided a space for the patient to ventilate their frustrations regarding their current life circumstances. Clinician measured the patient's anxiety and depression on a numerical scale. Clinician normalized the patients emotions regarding switching providers and reassured the patient that she will receive refills on her medications, referral assistance, and follow-up during this time. Clinician reassured the patient that while change is hard it can be beneficial as she will now qualify for more healthcare benefits and have access to a wide range of services she could not get before. The clinician encouraged the patient to utilize their coping skills to deal with their anxiety and reviewed several relaxation techniques. Clinician explored  negative thoughts with the patient and provided prompts as needed to assist the patient in challenging those thoughts.  Clinician encouraged the patient to call her new provider and schedule her first appointment to establish care and  to call the [facility] and make sure they had added her insurance on file and to request their help in lowering her prescription costs. Additionally, clinician offered to have the social worker at [facility] call the patient with prescription and mental health referrals and resources.  Standardized Assessments completed: GAD-7 and PHQ 9 PHQ-9     15 GAD-7     16  Assessment: Patient currently experiencing see above.   Patient may benefit from see above.  Plan: Follow up with behavioral health clinician on : none Behavioral recommendations: To follow up with referrals Referral(s): [place] (LME/Outside Clinic) Referral made to [facility]  "From scale of 1-10, how likely are you to follow plan?": [place], LCSWA

## 2021-01-02 ENCOUNTER — Telehealth: Payer: Self-pay

## 2021-01-02 NOTE — Telephone Encounter (Signed)
Social worker called patient to provide her with needed information upon her leaving the clinic due to her having Medicare ([employer]).  Provided patient with [place] phone number and address.  Also informed patient about an upcoming free OTC giveaway and provided her with a prescription resource website.

## 2021-01-14 ENCOUNTER — Telehealth: Payer: Self-pay

## 2021-01-14 NOTE — Telephone Encounter (Signed)
Social worker called patient on her mobile and left a voice message telling patient she was checking in with her and asked her to call the clinic and provided clinic phone number. Social worker then called patient's home phone number but it was the wrong phone number.

## 2021-01-16 NOTE — Telephone Encounter (Signed)
Social worker called patient on home phone and it was the wrong phone number. Social worker then called patient on mobile phone and left a voicemail message letting patient know that she was following up on patient's Medicare referral and provided clinic phone number.

## 2021-01-22 NOTE — Telephone Encounter (Signed)
Social worker called patient to follow up with her about her referral to [facility] and left a voice message asking her to call the clinic and provided clinic phone number.  On 01-28-2021, social worker left message for patient on phone # [phone] letting patient know that this was a follow up call and asked patient to call the clinic and provided the clinic phone number.

## 2021-02-10 ENCOUNTER — Ambulatory Visit: Payer: Self-pay | Admitting: Dermatology

## 2021-03-18 ENCOUNTER — Other Ambulatory Visit: Payer: Self-pay | Admitting: Gerontology

## 2021-03-18 DIAGNOSIS — E119 Type 2 diabetes mellitus without complications: Secondary | ICD-10-CM

## 2021-05-06 ENCOUNTER — Other Ambulatory Visit: Payer: Self-pay

## 2021-10-03 ENCOUNTER — Other Ambulatory Visit: Payer: Self-pay | Admitting: Family Medicine

## 2021-10-03 DIAGNOSIS — R260 Ataxic gait: Secondary | ICD-10-CM

## 2021-10-14 ENCOUNTER — Other Ambulatory Visit: Payer: Self-pay

## 2021-10-15 ENCOUNTER — Ambulatory Visit
Admission: RE | Admit: 2021-10-15 | Discharge: 2021-10-15 | Disposition: A | Discharge status: Home / Self Care | Payer: Medicare HMO | Source: Ambulatory Visit | Attending: Family Medicine | Admitting: Family Medicine

## 2021-10-15 DIAGNOSIS — R260 Ataxic gait: Secondary | ICD-10-CM | POA: Insufficient documentation

## 2022-03-24 ENCOUNTER — Emergency Department: Payer: Medicare Other

## 2022-03-24 ENCOUNTER — Encounter: Payer: Self-pay | Admitting: Emergency Medicine

## 2022-03-24 DIAGNOSIS — Z79899 Other long term (current) drug therapy: Secondary | ICD-10-CM

## 2022-03-24 DIAGNOSIS — E119 Type 2 diabetes mellitus without complications: Secondary | ICD-10-CM | POA: Diagnosis present

## 2022-03-24 DIAGNOSIS — U071 COVID-19: Secondary | ICD-10-CM | POA: Diagnosis not present

## 2022-03-24 DIAGNOSIS — Z7951 Long term (current) use of inhaled steroids: Secondary | ICD-10-CM

## 2022-03-24 DIAGNOSIS — J440 Chronic obstructive pulmonary disease with acute lower respiratory infection: Secondary | ICD-10-CM | POA: Diagnosis present

## 2022-03-24 DIAGNOSIS — F17210 Nicotine dependence, cigarettes, uncomplicated: Secondary | ICD-10-CM | POA: Diagnosis present

## 2022-03-24 DIAGNOSIS — J441 Chronic obstructive pulmonary disease with (acute) exacerbation: Secondary | ICD-10-CM | POA: Diagnosis present

## 2022-03-24 DIAGNOSIS — F419 Anxiety disorder, unspecified: Secondary | ICD-10-CM | POA: Diagnosis present

## 2022-03-24 DIAGNOSIS — Z90710 Acquired absence of both cervix and uterus: Secondary | ICD-10-CM

## 2022-03-24 DIAGNOSIS — R0902 Hypoxemia: Secondary | ICD-10-CM | POA: Diagnosis not present

## 2022-03-24 DIAGNOSIS — J9601 Acute respiratory failure with hypoxia: Secondary | ICD-10-CM | POA: Diagnosis present

## 2022-03-24 DIAGNOSIS — K219 Gastro-esophageal reflux disease without esophagitis: Secondary | ICD-10-CM | POA: Diagnosis present

## 2022-03-24 DIAGNOSIS — I10 Essential (primary) hypertension: Secondary | ICD-10-CM | POA: Diagnosis present

## 2022-03-24 DIAGNOSIS — Z888 Allergy status to other drugs, medicaments and biological substances status: Secondary | ICD-10-CM

## 2022-03-24 DIAGNOSIS — F32A Depression, unspecified: Secondary | ICD-10-CM | POA: Diagnosis present

## 2022-03-24 DIAGNOSIS — Z885 Allergy status to narcotic agent status: Secondary | ICD-10-CM

## 2022-03-24 DIAGNOSIS — Z28310 Unvaccinated for covid-19: Secondary | ICD-10-CM

## 2022-03-24 DIAGNOSIS — R197 Diarrhea, unspecified: Secondary | ICD-10-CM | POA: Diagnosis present

## 2022-03-24 DIAGNOSIS — Z7984 Long term (current) use of oral hypoglycemic drugs: Secondary | ICD-10-CM

## 2022-03-24 DIAGNOSIS — J1282 Pneumonia due to coronavirus disease 2019: Secondary | ICD-10-CM | POA: Diagnosis present

## 2022-03-24 LAB — CBC WITH DIFFERENTIAL/PLATELET
Abs Immature Granulocytes: 0.02 10*3/uL (ref 0.00–0.07)
Basophils Absolute: 0 10*3/uL (ref 0.0–0.1)
Basophils Relative: 1 %
Eosinophils Absolute: 0 10*3/uL (ref 0.0–0.5)
Eosinophils Relative: 0 %
HCT: 41.6 % (ref 36.0–46.0)
Hemoglobin: 14 g/dL (ref 12.0–15.0)
Immature Granulocytes: 0 %
Lymphocytes Relative: 24 %
Lymphs Abs: 2 10*3/uL (ref 0.7–4.0)
MCH: 28.3 pg (ref 26.0–34.0)
MCHC: 33.7 g/dL (ref 30.0–36.0)
MCV: 84.2 fL (ref 80.0–100.0)
Monocytes Absolute: 0.6 10*3/uL (ref 0.1–1.0)
Monocytes Relative: 7 %
Neutro Abs: 5.9 10*3/uL (ref 1.7–7.7)
Neutrophils Relative %: 68 %
Platelets: 154 10*3/uL (ref 150–400)
RBC: 4.94 MIL/uL (ref 3.87–5.11)
RDW: 13.7 % (ref 11.5–15.5)
WBC: 8.7 10*3/uL (ref 4.0–10.5)
nRBC: 0 % (ref 0.0–0.2)

## 2022-03-24 LAB — COMPREHENSIVE METABOLIC PANEL
ALT: 17 U/L (ref 0–44)
AST: 24 U/L (ref 15–41)
Albumin: 4.5 g/dL (ref 3.5–5.0)
Alkaline Phosphatase: 60 U/L (ref 38–126)
Anion gap: 11 (ref 5–15)
BUN: 14 mg/dL (ref 8–23)
CO2: 22 mmol/L (ref 22–32)
Calcium: 9.6 mg/dL (ref 8.9–10.3)
Chloride: 105 mmol/L (ref 98–111)
Creatinine, Ser: 1.31 mg/dL — ABNORMAL HIGH (ref 0.44–1.00)
GFR, Estimated: 45 mL/min — ABNORMAL LOW (ref >60)
Glucose, Bld: 141 mg/dL — ABNORMAL HIGH (ref 70–99)
Potassium: 4 mmol/L (ref 3.5–5.1)
Sodium: 138 mmol/L (ref 135–145)
Total Bilirubin: 0.7 mg/dL (ref 0.3–1.2)
Total Protein: 7.7 g/dL (ref 6.5–8.1)

## 2022-03-24 LAB — TROPONIN I (HIGH SENSITIVITY): Troponin I (High Sensitivity): 9 ng/L (ref <18)

## 2022-03-24 NOTE — ED Triage Notes (Signed)
Pt presents via POV with complaints of SOB for the last 2 weeks with associated chest tightness. Hx of COPD and has had an increase in inhaler use. Respirations are equal and unlabored. Denies fevers, N/V/D.

## 2022-03-24 NOTE — ED Provider Triage Note (Signed)
  Emergency Medicine Provider Triage Evaluation Note  Karla Carter , a 66 y.o.female,  was evaluated in triage.  Pt complains of shortness of breath x2 weeks.  Patient states that she has a history of COPD and has had to use her inhaler more often.  She was treated with antibiotics a couple weeks ago, does not had any significant improvement.  Denies any pain at this time.   Review of Systems  Positive: Shortness of breath Negative: Denies fever, chest pain, vomiting  Physical Exam   Vitals:   03/24/22 2112  BP: (!) 160/69  Pulse: 93  Resp: 20  Temp: 98.2 F (36.8 C)  SpO2: 96%   Gen:   Awake, no distress   Resp:  Normal effort  MSK:   Moves extremities without difficulty  Other:    Medical Decision Making  Given the patient's initial medical screening exam, the following diagnostic evaluation has been ordered. The patient will be placed in the appropriate treatment space, once one is available, to complete the evaluation and treatment. I have discussed the plan of care with the patient and I have advised the patient that an ED physician or mid-level practitioner will reevaluate their condition after the test results have been received, as the results may give them additional insight into the type of treatment they may need.    Diagnostics: Labs, EKG, CXR  Treatments: none immediately   Teodoro Spray, [place] 03/24/22 2223

## 2022-03-25 ENCOUNTER — Inpatient Hospital Stay
Admission: EM | Admit: 2022-03-25 | Discharge: 2022-03-27 | DRG: 177 | Disposition: A | Discharge status: Home / Self Care | Payer: Medicare Other | Attending: Internal Medicine | Admitting: Internal Medicine

## 2022-03-25 DIAGNOSIS — F32A Depression, unspecified: Secondary | ICD-10-CM | POA: Diagnosis present

## 2022-03-25 DIAGNOSIS — J9601 Acute respiratory failure with hypoxia: Secondary | ICD-10-CM | POA: Diagnosis present

## 2022-03-25 DIAGNOSIS — Z28310 Unvaccinated for covid-19: Secondary | ICD-10-CM | POA: Diagnosis not present

## 2022-03-25 DIAGNOSIS — J189 Pneumonia, unspecified organism: Secondary | ICD-10-CM | POA: Diagnosis not present

## 2022-03-25 DIAGNOSIS — K219 Gastro-esophageal reflux disease without esophagitis: Secondary | ICD-10-CM | POA: Diagnosis present

## 2022-03-25 DIAGNOSIS — F17210 Nicotine dependence, cigarettes, uncomplicated: Secondary | ICD-10-CM | POA: Diagnosis present

## 2022-03-25 DIAGNOSIS — R0902 Hypoxemia: Secondary | ICD-10-CM | POA: Diagnosis present

## 2022-03-25 DIAGNOSIS — U071 COVID-19: Secondary | ICD-10-CM | POA: Diagnosis present

## 2022-03-25 DIAGNOSIS — F17200 Nicotine dependence, unspecified, uncomplicated: Secondary | ICD-10-CM | POA: Diagnosis not present

## 2022-03-25 DIAGNOSIS — Z8659 Personal history of other mental and behavioral disorders: Secondary | ICD-10-CM

## 2022-03-25 DIAGNOSIS — J439 Emphysema, unspecified: Secondary | ICD-10-CM | POA: Diagnosis not present

## 2022-03-25 DIAGNOSIS — Z7951 Long term (current) use of inhaled steroids: Secondary | ICD-10-CM | POA: Diagnosis not present

## 2022-03-25 DIAGNOSIS — J440 Chronic obstructive pulmonary disease with acute lower respiratory infection: Secondary | ICD-10-CM | POA: Diagnosis present

## 2022-03-25 DIAGNOSIS — J441 Chronic obstructive pulmonary disease with (acute) exacerbation: Principal | ICD-10-CM

## 2022-03-25 DIAGNOSIS — Z8719 Personal history of other diseases of the digestive system: Secondary | ICD-10-CM | POA: Diagnosis not present

## 2022-03-25 DIAGNOSIS — F419 Anxiety disorder, unspecified: Secondary | ICD-10-CM | POA: Diagnosis present

## 2022-03-25 DIAGNOSIS — Z79899 Other long term (current) drug therapy: Secondary | ICD-10-CM | POA: Diagnosis not present

## 2022-03-25 DIAGNOSIS — R197 Diarrhea, unspecified: Secondary | ICD-10-CM | POA: Diagnosis present

## 2022-03-25 DIAGNOSIS — Z90710 Acquired absence of both cervix and uterus: Secondary | ICD-10-CM | POA: Diagnosis not present

## 2022-03-25 DIAGNOSIS — J1282 Pneumonia due to coronavirus disease 2019: Secondary | ICD-10-CM | POA: Diagnosis present

## 2022-03-25 DIAGNOSIS — Z885 Allergy status to narcotic agent status: Secondary | ICD-10-CM | POA: Diagnosis not present

## 2022-03-25 DIAGNOSIS — I10 Essential (primary) hypertension: Secondary | ICD-10-CM | POA: Diagnosis present

## 2022-03-25 DIAGNOSIS — J449 Chronic obstructive pulmonary disease, unspecified: Secondary | ICD-10-CM | POA: Diagnosis present

## 2022-03-25 DIAGNOSIS — Z7984 Long term (current) use of oral hypoglycemic drugs: Secondary | ICD-10-CM | POA: Diagnosis not present

## 2022-03-25 DIAGNOSIS — E785 Hyperlipidemia, unspecified: Secondary | ICD-10-CM

## 2022-03-25 DIAGNOSIS — E119 Type 2 diabetes mellitus without complications: Secondary | ICD-10-CM

## 2022-03-25 DIAGNOSIS — Z888 Allergy status to other drugs, medicaments and biological substances status: Secondary | ICD-10-CM | POA: Diagnosis not present

## 2022-03-25 LAB — C-REACTIVE PROTEIN: CRP: 1.1 mg/dL — ABNORMAL HIGH (ref <1.0)

## 2022-03-25 LAB — FERRITIN: Ferritin: 97 ng/mL (ref 11–307)

## 2022-03-25 LAB — PROCALCITONIN
Procalcitonin: <0.1 ng/mL
Procalcitonin: <0.1 ng/mL

## 2022-03-25 LAB — D-DIMER, QUANTITATIVE: D-Dimer, Quant: 0.36 ug/mL-FEU (ref 0.00–0.50)

## 2022-03-25 LAB — BRAIN NATRIURETIC PEPTIDE: B Natriuretic Peptide: 69 pg/mL (ref 0.0–100.0)

## 2022-03-25 LAB — TROPONIN I (HIGH SENSITIVITY): Troponin I (High Sensitivity): 8 ng/L (ref <18)

## 2022-03-25 LAB — LACTIC ACID, PLASMA
Lactic Acid, Venous: 2.1 mmol/L (ref 0.5–1.9)
Lactic Acid, Venous: 1.1 mmol/L (ref 0.5–1.9)

## 2022-03-25 LAB — RESP PANEL BY RT-PCR (FLU A&B, COVID) ARPGX2
Influenza A by PCR: NEGATIVE
Influenza B by PCR: NEGATIVE
SARS Coronavirus 2 by RT PCR: POSITIVE — AB

## 2022-03-25 LAB — CBG MONITORING, ED
Glucose-Capillary: 172 mg/dL — ABNORMAL HIGH (ref 70–99)
Glucose-Capillary: 178 mg/dL — ABNORMAL HIGH (ref 70–99)

## 2022-03-25 LAB — HEMOGLOBIN A1C
Hgb A1c MFr Bld: 6.1 % — ABNORMAL HIGH (ref 4.8–5.6)
Mean Plasma Glucose: 128.37 mg/dL

## 2022-03-25 LAB — FIBRINOGEN: Fibrinogen: 468 mg/dL (ref 210–475)

## 2022-03-25 MED ORDER — SODIUM CHLORIDE 0.9 % IV SOLN
2.0000 g | INTRAVENOUS | Status: DC
  Administered 2022-03-25 – 2022-03-26 (×2): 2 g via INTRAVENOUS
  Filled 2022-03-25 (×2): qty 20

## 2022-03-25 MED ORDER — PANTOPRAZOLE SODIUM 40 MG PO TBEC
40.0000 mg | DELAYED_RELEASE_TABLET | Freq: Every day | ORAL | Status: DC
  Administered 2022-03-25 – 2022-03-27 (×3): 40 mg via ORAL
  Filled 2022-03-25 (×3): qty 1

## 2022-03-25 MED ORDER — IPRATROPIUM-ALBUTEROL 0.5-2.5 (3) MG/3ML IN SOLN: 3.0000 mL | Freq: Four times a day (QID) | RESPIRATORY_TRACT | Status: DC

## 2022-03-25 MED ORDER — MOMETASONE FURO-FORMOTEROL FUM 200-5 MCG/ACT IN AERO
2.0000 | INHALATION_SPRAY | Freq: Two times a day (BID) | RESPIRATORY_TRACT | Status: DC
  Administered 2022-03-25 – 2022-03-27 (×4): 2 via RESPIRATORY_TRACT
  Filled 2022-03-25: qty 8.8

## 2022-03-25 MED ORDER — LISINOPRIL 5 MG PO TABS
5.0000 mg | ORAL_TABLET | Freq: Every day | ORAL | Status: DC
  Administered 2022-03-25 – 2022-03-27 (×3): 5 mg via ORAL
  Filled 2022-03-25 (×3): qty 1

## 2022-03-25 MED ORDER — IPRATROPIUM-ALBUTEROL 20-100 MCG/ACT IN AERS
1.0000 | INHALATION_SPRAY | Freq: Four times a day (QID) | RESPIRATORY_TRACT | Status: DC
  Administered 2022-03-25 – 2022-03-27 (×8): 1 via RESPIRATORY_TRACT
  Filled 2022-03-25: qty 4

## 2022-03-25 MED ORDER — FENOFIBRATE 54 MG PO TABS
54.0000 mg | ORAL_TABLET | Freq: Every day | ORAL | Status: DC
  Administered 2022-03-25 – 2022-03-27 (×3): 54 mg via ORAL
  Filled 2022-03-25 (×3): qty 1

## 2022-03-25 MED ORDER — NIRMATRELVIR/RITONAVIR (PAXLOVID)TABLET: 3.0000 | ORAL_TABLET | Freq: Two times a day (BID) | ORAL | Status: DC

## 2022-03-25 MED ORDER — SERTRALINE HCL 50 MG PO TABS
150.0000 mg | ORAL_TABLET | Freq: Every day | ORAL | Status: DC
  Administered 2022-03-26 – 2022-03-27 (×2): 150 mg via ORAL
  Filled 2022-03-25 (×2): qty 3

## 2022-03-25 MED ORDER — DEXAMETHASONE 4 MG PO TABS
6.0000 mg | ORAL_TABLET | Freq: Every day | ORAL | Status: DC
  Administered 2022-03-26 – 2022-03-27 (×2): 6 mg via ORAL
  Filled 2022-03-25: qty 1
  Filled 2022-03-25: qty 2

## 2022-03-25 MED ORDER — GLIPIZIDE 5 MG PO TABS: 5.0000 mg | ORAL_TABLET | Freq: Every day | ORAL | Status: DC

## 2022-03-25 MED ORDER — METHYLPREDNISOLONE SODIUM SUCC 125 MG IJ SOLR
125.0000 mg | Freq: Once | INTRAMUSCULAR | Status: AC
  Administered 2022-03-25: 125 mg via INTRAVENOUS
  Filled 2022-03-25: qty 2

## 2022-03-25 MED ORDER — MIRTAZAPINE 15 MG PO TABS
15.0000 mg | ORAL_TABLET | Freq: Every day | ORAL | Status: DC
  Administered 2022-03-25 – 2022-03-26 (×2): 15 mg via ORAL
  Filled 2022-03-25 (×2): qty 1

## 2022-03-25 MED ORDER — ONDANSETRON HCL 4 MG/2ML IJ SOLN: 4.0000 mg | Freq: Four times a day (QID) | INTRAMUSCULAR | Status: DC | PRN

## 2022-03-25 MED ORDER — DIPHENHYDRAMINE HCL 25 MG PO CAPS: 25.0000 mg | ORAL_CAPSULE | Freq: Every evening | ORAL | Status: DC | PRN

## 2022-03-25 MED ORDER — HYDROCOD POLI-CHLORPHE POLI ER 10-8 MG/5ML PO SUER: 5.0000 mL | Freq: Two times a day (BID) | ORAL | Status: DC | PRN

## 2022-03-25 MED ORDER — AMLODIPINE BESYLATE 5 MG PO TABS
5.0000 mg | ORAL_TABLET | Freq: Every day | ORAL | Status: DC
  Administered 2022-03-25 – 2022-03-27 (×3): 5 mg via ORAL
  Filled 2022-03-25 (×3): qty 1

## 2022-03-25 MED ORDER — SODIUM CHLORIDE 0.9 % IV SOLN
500.0000 mg | INTRAVENOUS | Status: DC
  Administered 2022-03-25 – 2022-03-26 (×2): 500 mg via INTRAVENOUS
  Filled 2022-03-25 (×2): qty 5

## 2022-03-25 MED ORDER — ENOXAPARIN SODIUM 40 MG/0.4ML IJ SOSY
40.0000 mg | PREFILLED_SYRINGE | INTRAMUSCULAR | Status: DC
  Administered 2022-03-25 – 2022-03-27 (×3): 40 mg via SUBCUTANEOUS
  Filled 2022-03-25 (×3): qty 0.4

## 2022-03-25 MED ORDER — SERTRALINE HCL 50 MG PO TABS: 50.0000 mg | ORAL_TABLET | Freq: Every day | ORAL | Status: DC

## 2022-03-25 MED ORDER — INSULIN ASPART 100 UNIT/ML IJ SOLN
0.0000 [IU] | Freq: Three times a day (TID) | INTRAMUSCULAR | Status: DC
  Administered 2022-03-25 (×2): 3 [IU] via SUBCUTANEOUS
  Administered 2022-03-26: 2 [IU] via SUBCUTANEOUS
  Administered 2022-03-26: 5 [IU] via SUBCUTANEOUS
  Administered 2022-03-26: 2 [IU] via SUBCUTANEOUS
  Administered 2022-03-27: 3 [IU] via SUBCUTANEOUS
  Filled 2022-03-25 (×6): qty 1

## 2022-03-25 MED ORDER — SODIUM CHLORIDE 0.9 % IV BOLUS (SEPSIS)
1000.0000 mL | Freq: Once | INTRAVENOUS | Status: AC
  Administered 2022-03-25: 1000 mL via INTRAVENOUS

## 2022-03-25 MED ORDER — GUAIFENESIN-DM 100-10 MG/5ML PO SYRP
10.0000 mL | ORAL_SOLUTION | ORAL | Status: DC | PRN
  Administered 2022-03-26: 10 mL via ORAL
  Filled 2022-03-25: qty 10

## 2022-03-25 MED ORDER — SODIUM CHLORIDE 0.9 % IV BOLUS (SEPSIS)
250.0000 mL | Freq: Once | INTRAVENOUS | Status: AC
  Administered 2022-03-25: 250 mL via INTRAVENOUS

## 2022-03-25 MED ORDER — ONDANSETRON HCL 4 MG PO TABS: 4.0000 mg | ORAL_TABLET | Freq: Four times a day (QID) | ORAL | Status: DC | PRN

## 2022-03-25 MED ORDER — NIRMATRELVIR/RITONAVIR (PAXLOVID) TABLET (RENAL DOSING)
2.0000 | ORAL_TABLET | Freq: Two times a day (BID) | ORAL | Status: DC
  Administered 2022-03-25 – 2022-03-27 (×5): 2 via ORAL
  Filled 2022-03-25: qty 20

## 2022-03-25 MED ORDER — AMLODIPINE BESYLATE 5 MG PO TABS: 10.0000 mg | ORAL_TABLET | Freq: Every day | ORAL | Status: DC

## 2022-03-25 MED ORDER — TRAZODONE HCL 50 MG PO TABS: 25.0000 mg | ORAL_TABLET | Freq: Every evening | ORAL | Status: DC | PRN

## 2022-03-25 MED ORDER — MAGNESIUM HYDROXIDE 400 MG/5ML PO SUSP: 30.0000 mL | Freq: Every day | ORAL | Status: DC | PRN

## 2022-03-25 MED ORDER — ACETAMINOPHEN 325 MG PO TABS
650.0000 mg | ORAL_TABLET | Freq: Four times a day (QID) | ORAL | Status: DC | PRN
  Administered 2022-03-26: 650 mg via ORAL
  Filled 2022-03-25: qty 2

## 2022-03-25 MED ORDER — IPRATROPIUM-ALBUTEROL 0.5-2.5 (3) MG/3ML IN SOLN
3.0000 mL | Freq: Once | RESPIRATORY_TRACT | Status: AC
  Administered 2022-03-25: 3 mL via RESPIRATORY_TRACT
  Filled 2022-03-25: qty 3

## 2022-03-25 MED ORDER — ALBUTEROL SULFATE HFA 108 (90 BASE) MCG/ACT IN AERS: 2.0000 | INHALATION_SPRAY | RESPIRATORY_TRACT | Status: DC | PRN

## 2022-03-25 NOTE — Assessment & Plan Note (Addendum)
Patient is COVID-19 PCR is positive and she is unvaccinated. Her CT threshold Is 29.4 Continue Paxlovid Supportive care with bronchodilator therapy, antitussives and vitamins We will start patient on Decadron 6 mg orally daily as she is requiring 2 L of oxygen for post ambulatory room air pulse ox between 87 and 89%

## 2022-03-25 NOTE — Assessment & Plan Note (Addendum)
Continue as needed bronchodilator therapy as well as inhaled steroids 

## 2022-03-25 NOTE — H&P (Signed)
History and Physical    Patient: Karla Carter:[id number] DOB: 12-Jun-1956 DOA: 03/25/2022 DOS: the patient was seen and examined on 03/25/2022 PCP: Marguerita Merles, MD  Patient coming from: Home  Chief Complaint:  Chief Complaint  Patient presents with   Shortness of Breath   HPI: Karla Carter is a 66 y.o. female with medical history significant for anxiety, nicotine dependence, COPD, depression, GERD, diabetes mellitus and hypertension who presents to the ER for evaluation of a 2 week history of worsening SOB associated with a cough productive of clear phlegm and chest tightness.  She has had to use her inhaler more frequently due to her symptoms.  Patient states that she has had several sick contacts at home with an upper respiratory tract infection that has resolved. She has had diarrhea. She denied having any fever or chills, no nausea, no vomiting, no abdominal pain, no dizziness, no lightheadedness, no blurred vision no focal deficit. She was noted to have post ambulatory pulse oximetry between 87 and 89% on room air and improved on 2 L to 94%. Patient SARS coronavirus 2 PCR test is positive She received a dose of Solu-Medrol in the ER and will be admitted to the hospital for further evaluation.   Review of Systems: As mentioned in the history of present illness. All other systems reviewed and are negative. Past Medical History:  Diagnosis Date   Anxiety    COPD (chronic obstructive pulmonary disease) ([place])    Depression    GERD (gastroesophageal reflux disease)    Hypertension    Past Surgical History:  Procedure Laterality Date   ABDOMINAL HYSTERECTOMY     APPENDECTOMY     COLON SURGERY     LIPOMA EXCISION Left 08/08/2019   Procedure: EXCISION LIPOMA Left Arm;  Surgeon: Olean Ree, MD;  Location: [facility] ORS;  Service: General;  Laterality: Left;   TONSILLECTOMY     Social History:  reports that she has been smoking cigarettes. She has a 36.00 pack-year smoking  history. She has never used smokeless tobacco. She reports that she does not drink alcohol and does not use drugs.  Allergies  Allergen Reactions   Metformin And Related    Codeine Rash    Family History  Problem Relation Age of Onset   Cancer Father    Cancer Maternal Aunt     Prior to Admission medications   Medication Sig Start Date End Date Taking? Authorizing Provider  amLODipine (NORVASC) 10 MG tablet TAKE ONE TABLET BY MOUTH EVERY DAY 06/25/20 03/25/22 Yes Regalado, Belkys A, MD  fenofibrate (TRICOR) 48 MG tablet TAKE ONE TABLET BY MOUTH EVERY DAY 11/06/20 03/25/22 Yes Iloabachie, Chioma E, NP  lisinopril (ZESTRIL) 5 MG tablet TAKE ONE TABLET BY MOUTH EVERY DAY 08/07/20 03/25/22 Yes Iloabachie, Chioma E, NP  mirtazapine (REMERON) 15 MG tablet TAKE ONE TABLET BY MOUTH AT BEDTIME 11/28/20 03/25/22 Yes Iloabachie, Chioma E, NP  pantoprazole (PROTONIX) 40 MG tablet TAKE ONE TABLET BY MOUTH EVERY DAY 08/07/20 03/25/22 Yes Iloabachie, Chioma E, NP  sertraline (ZOLOFT) 50 MG tablet TAKE 3 TABLETS BY MOUTH EVERY DAY 11/28/20 03/25/22 Yes Iloabachie, Chioma E, NP  albuterol (PROAIR HFA) 108 (90 Base) MCG/ACT inhaler Inhale 2 puffs into the lungs every 4 hours as needed. Replaces Ventolin. 07/09/20   Iloabachie, Chioma E, NP  ARIPiprazole (ABILIFY) 10 MG tablet Take 10 mg by mouth daily. 03/12/22   [provider]  blood glucose meter kit and supplies KIT Dispense based on patient  and insurance preference. Use up to four times daily as directed. (FOR ICD-9 250.00, 250.01). 10/24/19   Iloabachie, Chioma E, NP  Blood Pressure Monitor KIT 1 kit by Does not apply route daily. 05/10/19   Iloabachie, Chioma E, NP  diphenhydrAMINE (NIGHT TIME SLEEP AID) 25 MG tablet Take 25 mg by mouth at bedtime as needed for sleep.    [provider]  DULERA 200-5 MCG/ACT AERO Inhale 2 puffs into the lungs 2 (two) times daily. 03/22/22   [provider]  glipiZIDE (GLUCOTROL) 5 MG tablet TAKE  HALF A TABLET (2.5 MG) BY MOUTH IN THE MORNING AND 1 TABLET (5 MG) AT NIGHT 08/07/20 08/07/21  Iloabachie, Chioma E, NP  JARDIANCE 10 MG TABS tablet Take 10 mg by mouth daily. 02/11/22   [provider]  MELATONIN PO Take 40 mg by mouth at bedtime as needed (Sleep).     [provider]  metFORMIN (GLUCOPHAGE-XR) 500 MG 24 hr tablet Take 500 mg by mouth 2 (two) times daily. 02/11/22   [provider]  mometasone-formoterol (DULERA) 100-5 MCG/ACT AERO Inhale 2 puffs into the lungs 2 (two) times daily. 07/31/15   Henreitta Leber, MD  pravastatin (PRAVACHOL) 40 MG tablet Take 40 mg by mouth daily. 02/11/22   [provider]  Vitamin D, Ergocalciferol, (DRISDOL) 1.25 MG (50000 UNIT) CAPS capsule Take 50,000 Units by mouth once a week. 12/25/21   [provider]  zolpidem (AMBIEN) 10 MG tablet Take 10 mg by mouth at bedtime as needed. 03/02/22   [provider]  Fluticasone-Salmeterol (ADVAIR) 100-50 MCG/DOSE AEPB Inhale 1 puff into the lungs 2 (two) times daily. You can start using Advair  after you ran out of [facility]. 06/25/20 08/07/20  Elmarie Shiley, MD    Physical Exam: Vitals:   03/25/22 0430 03/25/22 0600 03/25/22 0630 03/25/22 0910  BP: 135/70 (!) 148/69 (!) 145/70 136/65  Pulse: 81 81 85 71  Resp: (!) 23 (!) 22 (!) 24 (!) 22  Temp:    98.6 F (37 C)  TempSrc:    Oral  SpO2: 95% 92% 96% 95%  Weight:      Height:       Physical Exam Vitals and nursing note reviewed.  Constitutional:      Appearance: She is well-developed.  HENT:     Head: Normocephalic and atraumatic.     Mouth/Throat:     Mouth: Mucous membranes are moist.  Eyes:     Pupils: Pupils are equal, round, and reactive to light.  Cardiovascular:     Rate and Rhythm: Normal rate and regular rhythm.  Pulmonary:     Effort: Pulmonary effort is normal.     Breath sounds: Examination of the right-middle field reveals rhonchi. Rhonchi present.  Abdominal:     General: Bowel  sounds are normal.     Palpations: Abdomen is soft.  Musculoskeletal:        General: Normal range of motion.     Cervical back: Normal range of motion and neck supple.  Skin:    General: Skin is warm and dry.  Neurological:     General: No focal deficit present.     Mental Status: She is alert.  Psychiatric:        Mood and Affect: Mood normal.        Behavior: Behavior normal.     Data Reviewed: Relevant notes from primary care and specialist visits, past discharge summaries as available in EHR, including  Care Everywhere. Prior diagnostic testing as pertinent to current admission diagnoses Updated medications and problem lists for reconciliation ED course, including vitals, labs, imaging, treatment and response to treatment Triage notes, nursing and pharmacy notes and ED provider's notes Notable results as noted in HPI Labs reviewed.  Procalcitonin less than 0.10, ferritin 97, lactic acid 2.1 >.1.1, BNP 69, fibrinogen 468, D-dimer 0.36, troponin 8, sodium 138, potassium 4.0, chloride 105, bicarb 22, glucose 141, BUN 14, creatinine 1.31, calcium 9.6, total protein 7.7, albumin 4.5, AST 24, ALT 17, alkaline phosphatase 60, total bilirubin 0.7, white count 8.7, hemoglobin 14.0, hematocrit 41.6, platelet count 154 SARS coronavirus 2 PCR positive Chest x-ray reviewed by me shows  Patchy ill-defined opacity in the periphery of the right mid lung, suspicious for pneumonia. Followup PA and lateral chest X-ray is recommended in 3-4 weeks following trial of antibiotic therapy to ensure resolution and exclude underlying malignancy. Chronic hyperinflation and bronchial thickening, imaging findings consistent with COPD. Twelve-lead EKG reviewed by me shows normal sinus rhythm with nonspecific ST and T wave changes There are no new results to review at this time.  Assessment and Plan: * CAP (community acquired pneumonia) Patient presents for evaluation of worsening shortness of breath and had a  chest x-ray which shows a patchy ill-defined opacity in the right midlung suspicious for pneumonia. Antibiotic therapy has been recommended as well as a repeat PA and lateral chest x-ray to document resolution and exclude underlying malignancy. We will treat patient empirically with Rocephin and Zithromax.   COVID-19 virus infection Patient is COVID-19 PCR is positive and she is unvaccinated. Her CT threshold Is 29.4 Continue Paxlovid Supportive care with bronchodilator therapy, antitussives and vitamins We will start patient on Decadron 6 mg orally daily as she is requiring 2 L of oxygen for post ambulatory room air pulse ox between 87 and 89%  COPD (chronic obstructive pulmonary disease) (HCC) Continue as needed bronchodilator therapy as well as inhaled steroids  Depression Continue trazodone, Remeron and sertraline  History of gastroesophageal reflux (GERD) Stable Continue Protonix  Type 2 diabetes mellitus without complications (HCC) Glycemic control with sliding scale insulin Consistent carbohydrate diet  Nicotine dependence Smoking cessation has been discussed with patient in detail Patient declines a nicotine transdermal patch at this time  Essential hypertension Blood pressure is stable Continue lisinopril and amlodipine      Advance Care Planning:   Code Status: Full Code   Consults: None  Family Communication: Greater than 50% of time was spent discussing patient's condition and plan of care with her at the bedside.  All questions and concerns have been addressed.  She verbalizes understanding and agrees plan.  Severity of Illness: The appropriate patient status for this patient is INPATIENT. Inpatient status is judged to be reasonable and necessary in order to provide the required intensity of service to ensure the patient's safety. The patient's presenting symptoms, physical exam findings, and initial radiographic and laboratory data in the context of their  chronic comorbidities is felt to place them at high risk for further clinical deterioration. Furthermore, it is not anticipated that the patient will be medically stable for discharge from the hospital within 2 midnights of admission.   * I certify that at the point of admission it is my clinical judgment that the patient will require inpatient hospital care spanning beyond 2 midnights from the point of admission due to high intensity of service, high risk for further deterioration and high frequency of surveillance required.*  Author: Royce Macadamia  Dayshon Roback, MD 03/25/2022 10:26 AM  For on call review [email or web address].

## 2022-03-25 NOTE — Assessment & Plan Note (Signed)
Patient presents for evaluation of worsening shortness of breath and had a chest x-ray which shows a patchy ill-defined opacity in the right midlung suspicious for pneumonia. Antibiotic therapy has been recommended as well as a repeat PA and lateral chest x-ray to document resolution and exclude underlying malignancy. We will treat patient empirically with Rocephin and Zithromax.

## 2022-03-25 NOTE — Assessment & Plan Note (Signed)
Glycemic control with sliding scale insulin Consistent carbohydrate diet

## 2022-03-25 NOTE — ED Provider Notes (Signed)
[facility] Provider Note    Event Date/Time   First MD Initiated Contact with Patient 03/25/22 [phone]     (approximate)   History   Shortness of Breath   HPI  Karla Carter is a 66 y.o. female who presents to the ED from home with a chief complaint of cough and shortness of breath for the past 2 weeks.  Endorses associated chest tightness.  History of COPD not on home oxygen.  Has had to use her inhaler more frequently.  Denies abdominal pain, nausea, vomiting or dizziness.  Patient is not vaccinated against COVID-19.     Past Medical History   Past Medical History:  Diagnosis Date   Anxiety    COPD (chronic obstructive pulmonary disease) ([place])    Depression    GERD (gastroesophageal reflux disease)    Hypertension      Active Problem List   Patient Active Problem List   Diagnosis Date Noted   History of gastroesophageal reflux (GERD) 02/29/2020   Type 2 diabetes mellitus without complications ([place]) 24/23/5361   Prediabetes 05/10/2019   Elevated lipids 05/10/2019   Smoking 05/10/2019   Elevated liver enzymes 05/10/2019   Skin lesion of left lower extremity 03/30/2019   Otalgia, bilateral 03/30/2019   Lipoma of left upper extremity 03/30/2019   Influenza 08/11/2018   Essential hypertension 08/11/2018   Acute bronchitis with COPD ([place]) 07/29/2015     Past Surgical History   Past Surgical History:  Procedure Laterality Date   ABDOMINAL HYSTERECTOMY     APPENDECTOMY     COLON SURGERY     LIPOMA EXCISION Left 08/08/2019   Procedure: EXCISION LIPOMA Left Arm;  Surgeon: Olean Ree, MD;  Location: [facility] ORS;  Service: General;  Laterality: Left;   TONSILLECTOMY       Home Medications   Prior to Admission medications   Medication Sig Start Date End Date Taking? Authorizing Provider  albuterol (PROAIR HFA) 108 (90 Base) MCG/ACT inhaler Inhale 2 puffs into the lungs every 4 hours as needed. Replaces Ventolin. 07/09/20   Iloabachie,  Chioma E, NP  amLODipine (NORVASC) 10 MG tablet TAKE ONE TABLET BY MOUTH EVERY DAY 06/25/20 06/25/21  Regalado, Jerald Kief A, MD  blood glucose meter kit and supplies KIT Dispense based on patient and insurance preference. Use up to four times daily as directed. (FOR ICD-9 250.00, 250.01). 10/24/19   Iloabachie, Chioma E, NP  Blood Pressure Monitor KIT 1 kit by Does not apply route daily. 05/10/19   Iloabachie, Chioma E, NP  diphenhydrAMINE (NIGHT TIME SLEEP AID) 25 MG tablet Take 25 mg by mouth at bedtime as needed for sleep.    [provider]  fenofibrate (TRICOR) 48 MG tablet TAKE ONE TABLET BY MOUTH EVERY DAY 11/06/20 11/06/21  Iloabachie, Chioma E, NP  glipiZIDE (GLUCOTROL) 5 MG tablet TAKE HALF A TABLET (2.5 MG) BY MOUTH IN THE MORNING AND 1 TABLET (5 MG) AT NIGHT 08/07/20 08/07/21  Iloabachie, Chioma E, NP  lisinopril (ZESTRIL) 5 MG tablet TAKE ONE TABLET BY MOUTH EVERY DAY 08/07/20 08/07/21  Iloabachie, Chioma E, NP  MELATONIN PO Take 40 mg by mouth at bedtime as needed (Sleep).     [provider]  mirtazapine (REMERON) 15 MG tablet TAKE ONE TABLET BY MOUTH AT BEDTIME 11/28/20 07/18/21  Iloabachie, Chioma E, NP  mometasone-formoterol (DULERA) 100-5 MCG/ACT AERO Inhale 2 puffs into the lungs 2 (two) times daily. Patient taking differently: Inhale 2 puffs into the lungs daily as  needed for shortness of breath. 07/31/15   Henreitta Leber, MD  pantoprazole (PROTONIX) 40 MG tablet TAKE ONE TABLET BY MOUTH EVERY DAY 08/07/20 08/07/21  Iloabachie, Chioma E, NP  sertraline (ZOLOFT) 50 MG tablet TAKE 3 TABLETS BY MOUTH EVERY DAY 11/28/20 05/18/21  Iloabachie, Chioma E, NP  Fluticasone-Salmeterol (ADVAIR) 100-50 MCG/DOSE AEPB Inhale 1 puff into the lungs 2 (two) times daily. You can start using Advair  after you ran out of [facility]. 06/25/20 08/07/20  Regalado, Jerald Kief A, MD     Allergies  Metformin and related and Codeine   Family History   Family History  Problem Relation Age of Onset   Cancer  Father    Cancer Maternal Aunt      Physical Exam  Triage Vital Signs: ED Triage Vitals  Enc Vitals Group     BP 03/24/22 2112 (!) 160/69     Pulse Rate 03/24/22 2112 93     Resp 03/24/22 2112 20     Temp 03/24/22 2112 98.2 F (36.8 C)     Temp Source 03/24/22 2327 Oral     SpO2 03/24/22 2112 96 %     Weight 03/24/22 2110 153 lb (69.4 kg)     Height 03/24/22 2110 5' 4" (1.626 m)     Head Circumference --      Peak Flow --      Pain Score 03/24/22 2109 2     Pain Loc --      Pain Edu? --      Excl. in [place]? --     Updated Vital Signs: BP (!) 158/78 (BP Location: Right Arm)   Pulse 82   Temp 99 F (37.2 C) (Oral)   Resp 20   Ht 5' 4" (1.626 m)   Wt 69.4 kg   SpO2 94%   BMI 26.26 kg/m    General: Awake, mild distress.  CV:  RRR. Good peripheral perfusion.  Resp:  Normal effort.  Diminished aeration with scattered wheezing. Abd:  Nontender.  No distention.  Other:  Bilateral calves are nontender and nonswollen.   ED Results / Procedures / Treatments  Labs (all labs ordered are listed, but only abnormal results are displayed) Labs Reviewed  RESP PANEL BY RT-PCR (FLU A&B, COVID) ARPGX2 - Abnormal; Notable for the following components:      Result Value   SARS Coronavirus 2 by RT PCR POSITIVE (*)    All other components within normal limits  COMPREHENSIVE METABOLIC PANEL - Abnormal; Notable for the following components:   Glucose, Bld 141 (*)    Creatinine, Ser 1.31 (*)    GFR, Estimated 45 (*)    All other components within normal limits  LACTIC ACID, PLASMA - Abnormal; Notable for the following components:   Lactic Acid, Venous 2.1 (*)    All other components within normal limits  CULTURE, BLOOD (ROUTINE X 2)  CULTURE, BLOOD (ROUTINE X 2)  CBC WITH DIFFERENTIAL/PLATELET  PROCALCITONIN  LACTIC ACID, PLASMA  TROPONIN I (HIGH SENSITIVITY)  TROPONIN I (HIGH SENSITIVITY)     EKG  ED ECG REPORT I, SUNG,JADE J, the attending physician, personally viewed  and interpreted this ECG.   Date: 03/25/2022  EKG Time: 2112  Rate: 93  Rhythm: normal sinus rhythm  Axis: Normal  Intervals:none  ST&T Change: Nonspecific    RADIOLOGY I have independently visualized and interpreted patient's chest x-ray as well as noted the radiology interpretation:  Chest x-ray: Right-sided pneumonia  Official radiology report(s): DG Chest  2 View  Result Date: 03/24/2022 CLINICAL DATA:  Shortness of breath. Chest tightness. EXAM: CHEST - 2 VIEW COMPARISON:  Radiograph 05/23/2019 FINDINGS: Chronic hyperinflation and bronchial thickening. There is patchy ill-defined opacity in the periphery of the right mid lung. Heart is normal in size with normal mediastinal contours. No pleural effusion or pneumothorax. No acute osseous abnormalities are seen. IMPRESSION: 1. Patchy ill-defined opacity in the periphery of the right mid lung, suspicious for pneumonia. Followup PA and lateral chest X-ray is recommended in 3-4 weeks following trial of antibiotic therapy to ensure resolution and exclude underlying malignancy. 2. Chronic hyperinflation and bronchial thickening, imaging findings consistent with COPD. Electronically Signed   By: Keith Rake M.D.   On: 03/24/2022 21:45     PROCEDURES:  Critical Care performed: Yes, see critical care procedure note(s)  CRITICAL CARE Performed by: Paulette Blanch   Total critical care time: 30 minutes  Critical care time was exclusive of separately billable procedures and treating other patients.  Critical care was necessary to treat or prevent imminent or life-threatening deterioration.  Critical care was time spent personally by me on the following activities: development of treatment plan with patient and/or surrogate as well as nursing, discussions with consultants, evaluation of patient's response to treatment, examination of patient, obtaining history from patient or surrogate, ordering and performing treatments and  interventions, ordering and review of laboratory studies, ordering and review of radiographic studies, pulse oximetry and re-evaluation of patient's condition.   Marland Kitchen1-3 Lead EKG Interpretation  Performed by: Paulette Blanch, MD Authorized by: Paulette Blanch, MD     Interpretation: normal     ECG rate:  80   ECG rate assessment: normal     Rhythm: sinus rhythm     Ectopy: none     Conduction: normal   Comments:     Patient placed on cardiac monitor to evaluate for arrhythmias    MEDICATIONS ORDERED IN ED: Medications  sodium chloride 0.9 % bolus 1,000 mL (has no administration in time range)    And  sodium chloride 0.9 % bolus 1,000 mL (has no administration in time range)    And  sodium chloride 0.9 % bolus 250 mL (has no administration in time range)  cefTRIAXone (ROCEPHIN) 2 g in sodium chloride 0.9 % 100 mL IVPB (has no administration in time range)  azithromycin (ZITHROMAX) 500 mg in sodium chloride 0.9 % 250 mL IVPB (has no administration in time range)  ipratropium-albuterol (DUONEB) 0.5-2.5 (3) MG/3ML nebulizer solution 3 mL (has no administration in time range)  methylPREDNISolone sodium succinate (SOLU-MEDROL) 125 mg/2 mL injection 125 mg (has no administration in time range)     IMPRESSION / MDM / ASSESSMENT AND PLAN / ED COURSE  I reviewed the triage vital signs and the nursing notes.                             66 year old female presenting with cough, shortness of breath, wheezing. Differential includes, but is not limited to, viral syndrome, bronchitis including COPD exacerbation, pneumonia, reactive airway disease including asthma, CHF including exacerbation with or without pulmonary/interstitial edema, pneumothorax, ACS, thoracic trauma, and pulmonary embolism.  I have personally reviewed patient's old records and note a neurology office visit from 02/03/2022 for B12 deficiency.  Patient's presentation is most consistent with acute presentation with potential threat to  life or bodily function.  The patient is on the cardiac monitor to evaluate for evidence  of arrhythmia and/or significant heart rate changes.  Laboratory results demonstrate normal WBC 8.7, mild AKI creatinine 1.31 which is elevated from 1 year ago, negative troponin.  Lactic acid is elevated at 2.1, COVID swab is positive, chest x-ray demonstrates right-sided pneumonia.  Patient was hypoxic on ambulation trial; dropped to 87% on room air.  Currently on 2 L nasal cannula oxygen with sats 95%.  ED code sepsis protocol initiated, IV antibiotics administered for community-acquired pneumonia.  Given 125 mg IV Solu-Medrol and DuoNeb for COPD exacerbation.  Will consult hospitalist services for evaluation and admission.      FINAL CLINICAL IMPRESSION(S) / ED DIAGNOSES   Final diagnoses:  COPD exacerbation ([place])  COVID-19  Community acquired pneumonia, unspecified laterality  Hypoxia     Rx / DC Orders   ED Discharge Orders     None        Note:  This document was prepared using Dragon voice recognition software and may include unintentional dictation errors.   Paulette Blanch, MD 03/25/22 [phone]

## 2022-03-25 NOTE — Assessment & Plan Note (Signed)
Smoking cessation has been discussed with patient in detail Patient declines a nicotine transdermal patch at this time

## 2022-03-25 NOTE — Assessment & Plan Note (Signed)
Blood pressure is stable Continue lisinopril and amlodipine

## 2022-03-25 NOTE — Assessment & Plan Note (Signed)
Stable ?Continue Protonix ?

## 2022-03-25 NOTE — ED Notes (Addendum)
Pt ambulated in the triage room and her O2 levels dropped down 87-89% on RA. While at rest the patients O2 improved to 94% on RA. Hx of COPD

## 2022-03-25 NOTE — Sepsis Progress Note (Signed)
Following per sepsis protocol   

## 2022-03-25 NOTE — ED Notes (Signed)
Delay in blood cultures due to difficulty with IV. IV team consult made.

## 2022-03-25 NOTE — Consult Note (Signed)
CODE SEPSIS - PHARMACY COMMUNICATION  **Broad Spectrum Antibiotics should be administered within 1 hour of Sepsis diagnosis**  Time Code Sepsis Called/Page Received: 0300  Antibiotics Ordered: 0315  Time of 1st antibiotic administration: 0320   Additional action taken by pharmacy: N/A  If necessary, Name of Provider/Nurse Contacted: N/A  Lorna Dibble ,PharmD Clinical Pharmacist  03/25/2022  3:17 AM

## 2022-03-25 NOTE — Assessment & Plan Note (Signed)
Continue trazodone, Remeron and sertraline

## 2022-03-26 ENCOUNTER — Other Ambulatory Visit: Payer: Self-pay

## 2022-03-26 DIAGNOSIS — E119 Type 2 diabetes mellitus without complications: Secondary | ICD-10-CM

## 2022-03-26 DIAGNOSIS — J439 Emphysema, unspecified: Secondary | ICD-10-CM

## 2022-03-26 DIAGNOSIS — Z8719 Personal history of other diseases of the digestive system: Secondary | ICD-10-CM

## 2022-03-26 DIAGNOSIS — F32A Depression, unspecified: Secondary | ICD-10-CM

## 2022-03-26 DIAGNOSIS — J9601 Acute respiratory failure with hypoxia: Secondary | ICD-10-CM

## 2022-03-26 DIAGNOSIS — F17200 Nicotine dependence, unspecified, uncomplicated: Secondary | ICD-10-CM

## 2022-03-26 LAB — C-REACTIVE PROTEIN: CRP: 0.8 mg/dL (ref <1.0)

## 2022-03-26 LAB — CBC WITH DIFFERENTIAL/PLATELET
Abs Immature Granulocytes: 0.11 10*3/uL — ABNORMAL HIGH (ref 0.00–0.07)
Basophils Absolute: 0 10*3/uL (ref 0.0–0.1)
Basophils Relative: 0 %
Eosinophils Absolute: 0 10*3/uL (ref 0.0–0.5)
Eosinophils Relative: 0 %
HCT: 36.1 % (ref 36.0–46.0)
Hemoglobin: 11.9 g/dL — ABNORMAL LOW (ref 12.0–15.0)
Immature Granulocytes: 1 %
Lymphocytes Relative: 22 %
Lymphs Abs: 1.7 10*3/uL (ref 0.7–4.0)
MCH: 29 pg (ref 26.0–34.0)
MCHC: 33 g/dL (ref 30.0–36.0)
MCV: 88 fL (ref 80.0–100.0)
Monocytes Absolute: 0.6 10*3/uL (ref 0.1–1.0)
Monocytes Relative: 8 %
Neutro Abs: 5.3 10*3/uL (ref 1.7–7.7)
Neutrophils Relative %: 69 %
Platelets: 123 10*3/uL — ABNORMAL LOW (ref 150–400)
RBC: 4.1 MIL/uL (ref 3.87–5.11)
RDW: 13.8 % (ref 11.5–15.5)
WBC: 7.8 10*3/uL (ref 4.0–10.5)
nRBC: 0 % (ref 0.0–0.2)

## 2022-03-26 LAB — CBG MONITORING, ED: Glucose-Capillary: 123 mg/dL — ABNORMAL HIGH (ref 70–99)

## 2022-03-26 LAB — HIV ANTIBODY (ROUTINE TESTING W REFLEX): HIV Screen 4th Generation wRfx: NONREACTIVE

## 2022-03-26 LAB — D-DIMER, QUANTITATIVE: D-Dimer, Quant: 0.34 ug/mL-FEU (ref 0.00–0.50)

## 2022-03-26 LAB — MAGNESIUM: Magnesium: 2.2 mg/dL (ref 1.7–2.4)

## 2022-03-26 LAB — GLUCOSE, CAPILLARY
Glucose-Capillary: 129 mg/dL — ABNORMAL HIGH (ref 70–99)
Glucose-Capillary: 214 mg/dL — ABNORMAL HIGH (ref 70–99)
Glucose-Capillary: 234 mg/dL — ABNORMAL HIGH (ref 70–99)

## 2022-03-26 LAB — FERRITIN: Ferritin: 80 ng/mL (ref 11–307)

## 2022-03-26 NOTE — Hospital Course (Addendum)
[name] is a 66 y.o. female with past medical history significant for anxiety, nicotine dependence, COPD, depression, GERD, diabetes mellitus and hypertension presented to hospital with 2-week history of worsening shortness of breath cough with sputum production and chest tightness.  She had been using her inhaler more frequently and had several sick contacts with upper respiratory tract infection recently.  She was also noted to be hypoxic.  Patient tested positive for COVID.  In the ED patient received Solu-Medrol and was admitted hospital for further evaluation and treatment.

## 2022-03-26 NOTE — Progress Notes (Addendum)
PROGRESS NOTE    Karla Carter  OVF:[id number] DOB: 05/04/1956 DOA: 03/25/2022 PCP: Marguerita Merles, MD    Brief Narrative:  Karla Carter is a 66 y.o. female with past medical history significant for anxiety, nicotine dependence, COPD, depression, GERD, diabetes mellitus and hypertension presented to hospital with 2-week history of worsening shortness of breath cough with sputum production and chest tightness.  She had been using her inhaler more frequently and had several sick contacts with upper respiratory tract infection recently.  She was also noted to be hypoxic.  Patient tested positive for COVID.  In the ED, patient received Solu-Medrol and was admitted hospital for further evaluation and treatment.    Assessment and plan    COVID-19 virus pneumonia with acute hypoxic respiratory failure. Patient is COVID-19 PCR is positive and she is unvaccinated. Her  Chest x-ray showing  patchy ill-defined opacity in the right midlung suspicious for pneumonia.  On Rocephin and Zithromax.  We will discontinue.  Blood cultures negative in 1 day procalcitonin negative x2.  Unlikely to be bacterial pneumonia.CT  threshold Is 29.4.  Continue Paxlovid.  Continue bronchodilators antitussives and steroid with dexamethasone as she is hypoxic on ambulation with pulse ox of 87 to 89%.  Continue 2 L of oxygen at this time. inflammatory markers with ferritin of 80 and D-dimer 0.3.   COPD (chronic obstructive pulmonary disease) (HCC) Continue bronchodilators, steroids, will be on dexamethasone as well.  Will wean oxygen as able.   Depression Continue trazodone, Remeron and sertraline   History of gastroesophageal reflux (GERD) Continue Protonix   Type 2 diabetes mellitus without complications (HCC) Continue sliding scale insulin Accu-Cheks diabetic diet.  We will closely monitor while on steroids.   Nicotine dependence Has refused nicotine patch.   Essential hypertension Continue lisinopril and  amlodipine      DVT prophylaxis: enoxaparin (LOVENOX) injection 40 mg Start: 03/25/22 1000   Code Status:     Code Status: Full Code  Disposition: Home likely on 03/27/2022 if continues to improve  Status is: Inpatient  Remains inpatient appropriate because: Pneumonia, COVID infection, respiratory failure,   Family Communication: Spoke with the patient at bedside  Consultants:  None  Procedures:  None  Antimicrobials:  Paxlovid Rocephin and Zithromax  Anti-infectives (From admission, onward)    Start     Dose/Rate Route Frequency Ordered Stop   03/25/22 1000  nirmatrelvir/ritonavir EUA (PAXLOVID) 3 tablet  Status:  Discontinued        3 tablet Oral 2 times daily 03/25/22 0434 03/25/22 0447   03/25/22 1000  nirmatrelvir/ritonavir EUA (renal dosing) (PAXLOVID) 2 tablet        2 tablet Oral 2 times daily 03/25/22 0447 03/30/22 0959   03/25/22 0315  cefTRIAXone (ROCEPHIN) 2 g in sodium chloride 0.9 % 100 mL IVPB        2 g 200 mL/hr over 30 Minutes Intravenous Every 24 hours 03/25/22 0300 03/30/22 0314   03/25/22 0315  azithromycin (ZITHROMAX) 500 mg in sodium chloride 0.9 % 250 mL IVPB        500 mg 250 mL/hr over 60 Minutes Intravenous Every 24 hours 03/25/22 0300 03/30/22 0314       Subjective: Today, patient was seen and examined at bedside.  Patient states that the Cipro is okay with minimal bleeding.  Has mild cough and shortness of breath.  No chest pain.  On supplemental oxygen  Objective: Vitals:   03/26/22 0700 03/26/22 0730 03/26/22 0800 03/26/22 0830  BP:      Pulse: (!) 57 (!) 56 (!) 52 66  Resp:      Temp:      TempSrc:      SpO2: 95% 97% 95% 95%  Weight:      Height:        Intake/Output Summary (Last 24 hours) at 03/26/2022 0855 Last data filed at 03/26/2022 0343 Gross per 24 hour  Intake 350 ml  Output --  Net 350 ml   Filed Weights   03/24/22 2110  Weight: 69.4 kg    Physical Examination: Body mass index is 26.26 kg/m.  General:   Average built, not in obvious distress, on nasal cannula oxygen, HENT:   No scleral pallor or icterus noted. Oral mucosa is moist.  Chest: Decreased breath sounds bilaterally.  CVS: S1 &S2 heard. No murmur.  Regular rate and rhythm. Abdomen: Soft, nontender, nondistended.  Bowel sounds are heard.   Extremities: No cyanosis, clubbing or edema.  Peripheral pulses are palpable. Psych: Alert, awake and oriented, normal mood CNS:  No cranial nerve deficits.  Power equal in all extremities.   Skin: Warm and dry.  No rashes noted.  Data Reviewed:   CBC: Recent Labs  Lab 03/24/22 2114 03/26/22 0517  WBC 8.7 7.8  NEUTROABS 5.9 5.3  HGB 14.0 11.9*  HCT 41.6 36.1  MCV 84.2 88.0  PLT 154 123*    Basic Metabolic Panel: Recent Labs  Lab 03/24/22 2114 03/26/22 0517  NA 138  --   K 4.0  --   CL 105  --   CO2 22  --   GLUCOSE 141*  --   BUN 14  --   CREATININE 1.31*  --   CALCIUM 9.6  --   MG  --  2.2    Liver Function Tests: Recent Labs  Lab 03/24/22 2114  AST 24  ALT 17  ALKPHOS 60  BILITOT 0.7  PROT 7.7  ALBUMIN 4.5     Radiology Studies: DG Chest 2 View  Result Date: 03/24/2022 CLINICAL DATA:  Shortness of breath. Chest tightness. EXAM: CHEST - 2 VIEW COMPARISON:  Radiograph 05/23/2019 FINDINGS: Chronic hyperinflation and bronchial thickening. There is patchy ill-defined opacity in the periphery of the right mid lung. Heart is normal in size with normal mediastinal contours. No pleural effusion or pneumothorax. No acute osseous abnormalities are seen. IMPRESSION: 1. Patchy ill-defined opacity in the periphery of the right mid lung, suspicious for pneumonia. Followup PA and lateral chest X-ray is recommended in 3-4 weeks following trial of antibiotic therapy to ensure resolution and exclude underlying malignancy. 2. Chronic hyperinflation and bronchial thickening, imaging findings consistent with COPD. Electronically Signed   By: Keith Rake M.D.   On: 03/24/2022  21:45      LOS: 1 day    Flora Lipps, MD [facility] Available via Epic secure chat 7am-7pm After these hours, please refer to coverage provider listed on [email or web address] 03/26/2022, 8:55 AM

## 2022-03-26 NOTE — ED Notes (Signed)
Breakfast tray given. °

## 2022-03-27 DIAGNOSIS — I10 Essential (primary) hypertension: Secondary | ICD-10-CM

## 2022-03-27 LAB — CBC WITH DIFFERENTIAL/PLATELET
Abs Immature Granulocytes: 0.08 10*3/uL — ABNORMAL HIGH (ref 0.00–0.07)
Basophils Absolute: 0 10*3/uL (ref 0.0–0.1)
Basophils Relative: 0 %
Eosinophils Absolute: 0 10*3/uL (ref 0.0–0.5)
Eosinophils Relative: 0 %
HCT: 36.2 % (ref 36.0–46.0)
Hemoglobin: 12.1 g/dL (ref 12.0–15.0)
Immature Granulocytes: 1 %
Lymphocytes Relative: 16 %
Lymphs Abs: 1.3 10*3/uL (ref 0.7–4.0)
MCH: 28.3 pg (ref 26.0–34.0)
MCHC: 33.4 g/dL (ref 30.0–36.0)
MCV: 84.8 fL (ref 80.0–100.0)
Monocytes Absolute: 0.3 10*3/uL (ref 0.1–1.0)
Monocytes Relative: 4 %
Neutro Abs: 6.3 10*3/uL (ref 1.7–7.7)
Neutrophils Relative %: 79 %
Platelets: 149 10*3/uL — ABNORMAL LOW (ref 150–400)
RBC: 4.27 MIL/uL (ref 3.87–5.11)
RDW: 13.6 % (ref 11.5–15.5)
WBC: 8.1 10*3/uL (ref 4.0–10.5)
nRBC: 0 % (ref 0.0–0.2)

## 2022-03-27 LAB — GLUCOSE, CAPILLARY: Glucose-Capillary: 197 mg/dL — ABNORMAL HIGH (ref 70–99)

## 2022-03-27 LAB — FERRITIN: Ferritin: 98 ng/mL (ref 11–307)

## 2022-03-27 LAB — C-REACTIVE PROTEIN: CRP: 1 mg/dL — ABNORMAL HIGH (ref <1.0)

## 2022-03-27 LAB — D-DIMER, QUANTITATIVE: D-Dimer, Quant: <0.27 ug/mL-FEU (ref 0.00–0.50)

## 2022-03-27 LAB — MAGNESIUM: Magnesium: 2.3 mg/dL (ref 1.7–2.4)

## 2022-03-27 MED ORDER — AMLODIPINE BESYLATE 10 MG PO TABS: ORAL_TABLET | Freq: Every day | ORAL | 2 refills | Status: AC

## 2022-03-27 MED ORDER — GUAIFENESIN-DM 100-10 MG/5ML PO SYRP: 10.0000 mL | ORAL_SOLUTION | ORAL | 0 refills | Status: DC | PRN

## 2022-03-27 MED ORDER — DEXAMETHASONE 6 MG PO TABS: 6.0000 mg | ORAL_TABLET | Freq: Every day | ORAL | 0 refills | Status: AC

## 2022-03-27 MED ORDER — NIRMATRELVIR/RITONAVIR (PAXLOVID) TABLET (RENAL DOSING): 2.0000 | ORAL_TABLET | Freq: Two times a day (BID) | ORAL | 0 refills | Status: AC

## 2022-03-27 MED ORDER — GLIPIZIDE 5 MG PO TABS: ORAL_TABLET | ORAL | 1 refills | Status: AC

## 2022-03-27 MED ORDER — MIRTAZAPINE 15 MG PO TABS: ORAL_TABLET | Freq: Every day | ORAL | 2 refills | Status: AC

## 2022-03-27 MED ORDER — FENOFIBRATE 48 MG PO TABS: ORAL_TABLET | Freq: Every day | ORAL | 0 refills | Status: AC

## 2022-03-27 MED ORDER — LISINOPRIL 5 MG PO TABS: ORAL_TABLET | Freq: Every day | ORAL | 2 refills | Status: AC

## 2022-03-27 MED ORDER — PANTOPRAZOLE SODIUM 40 MG PO TBEC: DELAYED_RELEASE_TABLET | Freq: Every day | ORAL | 3 refills | Status: AC

## 2022-03-27 MED ORDER — SERTRALINE HCL 50 MG PO TABS: ORAL_TABLET | Freq: Every day | ORAL | 2 refills | Status: AC

## 2022-03-27 NOTE — Care Management Important Message (Signed)
Important Message  Patient Details  Name: Karla Carter MRN: [id number] Date of Birth: 03-04-1956   Medicare Important Message Given:  N/A - LOS <3 / Initial given by admissions     Juliann Pulse A Shiraz Bastyr 03/27/2022, 10:11 AM

## 2022-03-27 NOTE — Progress Notes (Signed)
Received MD order to discharge patient to home, reviewed home meds, prescriptions and discharge instructions with patient and patient verbalized understanding

## 2022-03-27 NOTE — Discharge Summary (Signed)
Physician Discharge Summary   Patient: Karla Carter MRN: [id number] DOB: 04/20/1956  Admit date:     03/25/2022  Discharge date: 03/27/22  Discharge Physician: Laxman Pokhrel   PCP: Miles, Linda M, MD   Recommendations at discharge:   Follow-up with your primary care provider in 1 week.  Check CBC BMP magnesium LFT in the next visit.   Complete the course of paxlovid and dexamethasone.  Discharge Diagnoses: Principal Problem:   CAP (community acquired pneumonia) Active Problems:   COVID-19 virus infection   Essential hypertension   Nicotine dependence   Type 2 diabetes mellitus without complications (HCC)   History of gastroesophageal reflux (GERD)   Depression   COPD (chronic obstructive pulmonary disease) (HCC)  Resolved Problems:   * No resolved hospital problems. *  Hospital Course: Karla Carter is a 66 y.o. female with past medical history significant for anxiety, nicotine dependence, COPD, depression, GERD, diabetes mellitus and hypertension presented to hospital with 2-week history of worsening shortness of breath cough with sputum production and chest tightness.  She had been using her inhaler more frequently and had several sick contacts with upper respiratory tract infection recently.  She was also noted to be hypoxic.  Patient tested positive for COVID.  In the ED patient received Solu-Medrol and was admitted hospital for further evaluation and treatment.     Assessment and Plan: Principal Problem:   CAP (community acquired pneumonia) Active Problems:   COVID-19 virus infection   Essential hypertension   Nicotine dependence   Type 2 diabetes mellitus without complications (HCC)   History of gastroesophageal reflux (GERD)   Depression   COPD (chronic obstructive pulmonary disease) (HCC)   COVID-19 virus pneumonia with acute hypoxic respiratory failure. Improved at this time.  Respiratory failure has resolved.  Unvaccinated for COVID-19.  Chest x-ray  showing  patchy ill-defined opacity in the right midlung suspicious for pneumonia.   Blood cultures negative in 2 days, procalcitonin negative x2.  CT  threshold was 29.4.  Inflammatory markers with elevated over within normal range.  Patient was initiated on Paxil but during hospitalization was able to be continued on discharge to complete the course she was on dexamethasone since she was hypoxic on ambulation.  We will also complete the course of dexamethasone on discharge.  COPD (chronic obstructive pulmonary disease) (HCC) We will continue inhalers from home including dexamethasone.  Patient was advised to quit smoking.  States that she used to smoke 4 packs now only half pack a day.  Did not wish to take nicotine patch.   Depression Continue trazodone, Remeron and sertraline   History of gastroesophageal reflux (GERD) Continue Protonix   Type 2 diabetes mellitus without complications (HCC) On Jardiance and metformin at home.  This will be resumed on discharge.   Nicotine dependence Refuses nicotine patch.   Essential hypertension Continue lisinopril and amlodipine    Consultants: None  Procedures performed: None  Disposition: Home Diet recommendation:  Discharge Diet Orders (From admission, onward)     Start     Ordered   03/27/22 0000  Diet Carb Modified        03/27/22 0929           Carb modified diet DISCHARGE MEDICATION: Allergies as of 03/27/2022       Reactions   Metformin And Related    Codeine Rash        Medication List     STOP taking these medications    Night Time Sleep   Aid 25 MG tablet Generic drug: diphenhydrAMINE       TAKE these medications    albuterol 108 (90 Base) MCG/ACT inhaler Commonly known as: ProAir HFA Inhale 2 puffs into the lungs every 4 hours as needed. Replaces Ventolin.   amLODipine 10 MG tablet Commonly known as: NORVASC TAKE ONE TABLET BY MOUTH EVERY DAY   ARIPiprazole 10 MG tablet Commonly known as:  ABILIFY Take 10 mg by mouth daily.   blood glucose meter kit and supplies Kit Dispense based on patient and insurance preference. Use up to four times daily as directed. (FOR ICD-9 250.00, 250.01).   Blood Pressure Monitor Kit 1 kit by Does not apply route daily.   dexamethasone 6 MG tablet Commonly known as: DECADRON Take 1 tablet (6 mg total) by mouth daily for 3 days.   fenofibrate 48 MG tablet Commonly known as: TRICOR TAKE ONE TABLET BY MOUTH EVERY DAY   glipiZIDE 5 MG tablet Commonly known as: GLUCOTROL TAKE HALF A TABLET (2.5 MG) BY MOUTH IN THE MORNING AND 1 TABLET (5 MG) AT NIGHT   guaiFENesin-dextromethorphan 100-10 MG/5ML syrup Commonly known as: ROBITUSSIN DM Take 10 mLs by mouth every 4 (four) hours as needed for cough.   Jardiance 10 MG Tabs tablet Generic drug: empagliflozin Take 10 mg by mouth daily.   lisinopril 5 MG tablet Commonly known as: ZESTRIL TAKE ONE TABLET BY MOUTH EVERY DAY   MELATONIN PO Take 40 mg by mouth at bedtime as needed (Sleep).   metFORMIN 500 MG 24 hr tablet Commonly known as: GLUCOPHAGE-XR Take 500 mg by mouth 2 (two) times daily.   mirtazapine 15 MG tablet Commonly known as: REMERON TAKE ONE TABLET BY MOUTH AT BEDTIME   mometasone-formoterol 100-5 MCG/ACT Aero Commonly known as: DULERA Inhale 2 puffs into the lungs 2 (two) times daily.   Dulera 200-5 MCG/ACT Aero Generic drug: mometasone-formoterol Inhale 2 puffs into the lungs 2 (two) times daily.   nirmatrelvir/ritonavir EUA (renal dosing) 10 x 150 MG & 10 x 100MG Tabs Commonly known as: PAXLOVID Take 2 tablets by mouth 2 (two) times daily for 5 days. Take nirmatrelvir (150 mg) one tablet twice daily for 5 days and ritonavir (100 mg) one tablet twice daily for 5 days.   pantoprazole 40 MG tablet Commonly known as: PROTONIX TAKE ONE TABLET BY MOUTH EVERY DAY   pravastatin 40 MG tablet Commonly known as: PRAVACHOL Take 40 mg by mouth daily.   sertraline 50 MG  tablet Commonly known as: ZOLOFT TAKE 3 TABLETS BY MOUTH EVERY DAY   Vitamin D (Ergocalciferol) 1.25 MG (50000 UNIT) Caps capsule Commonly known as: DRISDOL Take 50,000 Units by mouth once a week.   zolpidem 10 MG tablet Commonly known as: AMBIEN Take 10 mg by mouth at bedtime as needed.         Subjective Today, patient was seen and examined at bedside.  Feels better.  Was able to ambulate well.  Has mild cough but no shortness of breath or dyspnea.  Off oxygen.  Discharge Exam: Filed Weights   03/24/22 2110  Weight: 69.4 kg      03/27/2022    8:53 AM 03/27/2022    4:58 AM 03/26/2022    8:51 PM  Vitals with BMI  Systolic 145 137 135  Diastolic 67 66 63  Pulse 60 64 71    General:  Average built, not in obvious distress, on room air HENT:   No scleral pallor or icterus noted. Oral mucosa   is moist.  Chest:    Diminished breath sounds bilaterally. No crackles or wheezes.  CVS: S1 &S2 heard. No murmur.  Regular rate and rhythm. Abdomen: Soft, nontender, nondistended.  Bowel sounds are heard.   Extremities: No cyanosis, clubbing or edema.  Peripheral pulses are palpable. Psych: Alert, awake and oriented, normal mood CNS:  No cranial nerve deficits.  Power equal in all extremities.   Skin: Warm and dry.  No rashes noted.  Condition at discharge: good  The results of significant diagnostics from this hospitalization (including imaging, microbiology, ancillary and laboratory) are listed below for reference.   Imaging Studies: DG Chest 2 View  Result Date: 03/24/2022 CLINICAL DATA:  Shortness of breath. Chest tightness. EXAM: CHEST - 2 VIEW COMPARISON:  Radiograph 05/23/2019 FINDINGS: Chronic hyperinflation and bronchial thickening. There is patchy ill-defined opacity in the periphery of the right mid lung. Heart is normal in size with normal mediastinal contours. No pleural effusion or pneumothorax. No acute osseous abnormalities are seen. IMPRESSION: 1. Patchy ill-defined  opacity in the periphery of the right mid lung, suspicious for pneumonia. Followup PA and lateral chest X-ray is recommended in 3-4 weeks following trial of antibiotic therapy to ensure resolution and exclude underlying malignancy. 2. Chronic hyperinflation and bronchial thickening, imaging findings consistent with COPD. Electronically Signed   By: Keith Rake M.D.   On: 03/24/2022 21:45    Microbiology: Results for orders placed or performed during the hospital encounter of 03/25/22  Resp Panel by RT-PCR (Flu A&B, Covid) Anterior Nasal Swab     Status: Abnormal   Collection Time: 03/24/22 11:26 PM   Specimen: Anterior Nasal Swab  Result Value Ref Range Status   SARS Coronavirus 2 by RT PCR POSITIVE (A) NEGATIVE Final    Comment: (NOTE) SARS-CoV-2 target nucleic acids are DETECTED.  The SARS-CoV-2 RNA is generally detectable in upper respiratory specimens during the acute phase of infection. Positive results are indicative of the presence of the identified virus, but do not rule out bacterial infection or co-infection with other pathogens not detected by the test. Clinical correlation with patient history and other diagnostic information is necessary to determine patient infection status. The expected result is Negative.  Fact Sheet for Patients: EntrepreneurPulse.com.au  Fact Sheet for Healthcare Providers: IncredibleEmployment.be  This test is not yet approved or cleared by the [place] FDA and  has been authorized for detection and/or diagnosis of SARS-CoV-2 by FDA under an Emergency Use Authorization (EUA).  This EUA will remain in effect (meaning this test can be used) for the duration of  the COVID-19 declaration under Section 564(b)(1) of the A ct, 21 U.S.C. section 360bbb-3(b)(1), unless the authorization is terminated or revoked sooner.     Influenza A by PCR NEGATIVE NEGATIVE Final   Influenza B by PCR NEGATIVE NEGATIVE Final     Comment: (NOTE) The Xpert Xpress SARS-CoV-2/FLU/RSV plus assay is intended as an aid in the diagnosis of influenza from Nasopharyngeal swab specimens and should not be used as a sole basis for treatment. Nasal washings and aspirates are unacceptable for Xpert Xpress SARS-CoV-2/FLU/RSV testing.  Fact Sheet for Patients: EntrepreneurPulse.com.au  Fact Sheet for Healthcare Providers: IncredibleEmployment.be  This test is not yet approved or cleared by the [place] FDA and has been authorized for detection and/or diagnosis of SARS-CoV-2 by FDA under an Emergency Use Authorization (EUA). This EUA will remain in effect (meaning this test can be used) for the duration of the COVID-19 declaration under Section 564(b)(1) of the  Act, 21 U.S.C. section 360bbb-3(b)(1), unless the authorization is terminated or revoked.  Performed at [facility], [address] [place], [place] [address]   Culture, blood (routine x 2)     Status: None (Preliminary result)   Collection Time: 03/25/22 12:58 AM   Specimen: BLOOD  Result Value Ref Range Status   Specimen Description BLOOD RIGHT HAND  Final   Special Requests   Final    BOTTLES DRAWN AEROBIC AND ANAEROBIC Blood Culture adequate volume   Culture   Final    NO GROWTH 2 DAYS Performed at [facility], [address] [place], [place] [address]    Report Status PENDING  Incomplete  Culture, blood (routine x 2)     Status: None (Preliminary result)   Collection Time: 03/25/22 12:58 AM   Specimen: BLOOD  Result Value Ref Range Status   Specimen Description BLOOD LEFT ASSIST CONTROL  Final   Special Requests   Final    BOTTLES DRAWN AEROBIC AND ANAEROBIC Blood Culture adequate volume   Culture   Final    NO GROWTH 2 DAYS Performed at [facility], [address] [place], [place] [address]    Report Status PENDING  Incomplete    Labs: CBC: Recent Labs  Lab  03/24/22 2114 03/26/22 0517 03/27/22 0543  WBC 8.7 7.8 8.1  NEUTROABS 5.9 5.3 6.3  HGB 14.0 11.9* 12.1  HCT 41.6 36.1 36.2  MCV 84.2 88.0 84.8  PLT 154 123* 149*   Basic Metabolic Panel: Recent Labs  Lab 03/24/22 2114 03/26/22 0517 03/27/22 0543  NA 138  --   --   K 4.0  --   --   CL 105  --   --   CO2 22  --   --   GLUCOSE 141*  --   --   BUN 14  --   --   CREATININE 1.31*  --   --   CALCIUM 9.6  --   --   MG  --  2.2 2.3   Liver Function Tests: Recent Labs  Lab 03/24/22 2114  AST 24  ALT 17  ALKPHOS 60  BILITOT 0.7  PROT 7.7  ALBUMIN 4.5   CBG: Recent Labs  Lab 03/26/22 0810 03/26/22 1200 03/26/22 1547 03/26/22 2159 03/27/22 0851  GLUCAP 123* 129* 214* 234* 197*    Discharge time spent: greater than 30 minutes.  Signed: Laxman Pokhrel, MD [facility] 03/27/2022 

## 2022-03-30 LAB — CULTURE, BLOOD (ROUTINE X 2)
Culture: NO GROWTH
Culture: NO GROWTH
Special Requests: ADEQUATE
Special Requests: ADEQUATE

## 2023-01-21 ENCOUNTER — Telehealth: Payer: Self-pay

## 2023-01-21 ENCOUNTER — Other Ambulatory Visit: Payer: Self-pay

## 2023-01-21 DIAGNOSIS — Z1211 Encounter for screening for malignant neoplasm of colon: Secondary | ICD-10-CM

## 2023-01-21 MED ORDER — NA SULFATE-K SULFATE-MG SULF 17.5-3.13-1.6 GM/177ML PO SOLN: 1.0000 | Freq: Once | ORAL | 0 refills | Status: AC

## 2023-01-21 NOTE — Telephone Encounter (Signed)
Gastroenterology Pre-Procedure Review  Request Date: 03/02/23 Requesting Physician: Dr. Tobi Bastos  PATIENT REVIEW QUESTIONS: The patient responded to the following health history questions as indicated:    1. Are you having any GI issues? no 2. Do you have a personal history of Polyps? no 3. Do you have a family history of Colon Cancer or Polyps? no 4. Diabetes Mellitus? MEDICATION REMINDERS:  STOP JARDIANCE 3 DAYS BEFORE COLONOSCOPY 09/14 STOP METFORMIN 2 DAYS BEFORE COLONOSCOPY 09/15 STOP GLIPIZIDE 1 DAY BEFORE COLONOSCOPY 09/16 5. Joint replacements in the past 12 months?no 6. Major health problems in the past 3 months?no 7. Any artificial heart valves, MVP, or defibrillator?no    MEDICATIONS & ALLERGIES:    Patient reports the following regarding taking any anticoagulation/antiplatelet therapy:   Plavix, Coumadin, Eliquis, Xarelto, Lovenox, Pradaxa, Brilinta, or Effient? no Aspirin? no  Patient confirms/reports the following medications:  Current Outpatient Medications  Medication Sig Dispense Refill   albuterol (PROAIR HFA) 108 (90 Base) MCG/ACT inhaler Inhale 2 puffs into the lungs every 4 hours as needed. Replaces Ventolin. 25.5 g 3   amLODipine (NORVASC) 10 MG tablet TAKE ONE TABLET BY MOUTH EVERY DAY 60 tablet 2   ARIPiprazole (ABILIFY) 10 MG tablet Take 10 mg by mouth daily.     blood glucose meter kit and supplies KIT Dispense based on patient and insurance preference. Use up to four times daily as directed. (FOR ICD-9 250.00, 250.01). 1 each 0   Blood Pressure Monitor KIT 1 kit by Does not apply route daily. 1 kit 0   DULERA 200-5 MCG/ACT AERO Inhale 2 puffs into the lungs 2 (two) times daily.     fenofibrate (TRICOR) 48 MG tablet TAKE ONE TABLET BY MOUTH EVERY DAY 14 tablet 0   glipiZIDE (GLUCOTROL) 5 MG tablet TAKE HALF A TABLET (2.5 MG) BY MOUTH IN THE MORNING AND 1 TABLET (5 MG) AT NIGHT 60 tablet 1   guaiFENesin-dextromethorphan (ROBITUSSIN DM) 100-10 MG/5ML syrup Take  10 mLs by mouth every 4 (four) hours as needed for cough. 118 mL 0   JARDIANCE 10 MG TABS tablet Take 10 mg by mouth daily.     lisinopril (ZESTRIL) 5 MG tablet TAKE ONE TABLET BY MOUTH EVERY DAY 30 tablet 2   MELATONIN PO Take 40 mg by mouth at bedtime as needed (Sleep).      metFORMIN (GLUCOPHAGE-XR) 500 MG 24 hr tablet Take 500 mg by mouth 2 (two) times daily.     mirtazapine (REMERON) 15 MG tablet TAKE ONE TABLET BY MOUTH AT BEDTIME 30 tablet 2   mometasone-formoterol (DULERA) 100-5 MCG/ACT AERO Inhale 2 puffs into the lungs 2 (two) times daily. 1 Inhaler 3   pantoprazole (PROTONIX) 40 MG tablet TAKE ONE TABLET BY MOUTH EVERY DAY 30 tablet 3   pravastatin (PRAVACHOL) 40 MG tablet Take 40 mg by mouth daily.     sertraline (ZOLOFT) 50 MG tablet TAKE 3 TABLETS BY MOUTH EVERY DAY 90 tablet 2   Vitamin D, Ergocalciferol, (DRISDOL) 1.25 MG (50000 UNIT) CAPS capsule Take 50,000 Units by mouth once a week.     zolpidem (AMBIEN) 10 MG tablet Take 10 mg by mouth at bedtime as needed.     No current facility-administered medications for this visit.    Patient confirms/reports the following allergies:  Allergies  Allergen Reactions   Metformin And Related    Codeine Rash    No orders of the defined types were placed in this encounter.   AUTHORIZATION INFORMATION Primary Insurance:  1D#: Group #:  Secondary Insurance: 1D#: Group #:  SCHEDULE INFORMATION: Date: 03/02/23 Time: Location: [facility]

## 2023-01-26 ENCOUNTER — Other Ambulatory Visit: Payer: Self-pay

## 2023-03-01 ENCOUNTER — Encounter: Payer: Self-pay | Admitting: Gastroenterology

## 2023-03-02 ENCOUNTER — Ambulatory Visit: Payer: 59 | Admitting: Certified Registered"

## 2023-03-02 ENCOUNTER — Encounter
Admission: RE | Disposition: A | Discharge status: Home / Self Care | Payer: Self-pay | Source: Home / Self Care | Attending: Gastroenterology

## 2023-03-02 ENCOUNTER — Encounter: Payer: Self-pay | Admitting: Gastroenterology

## 2023-03-02 ENCOUNTER — Other Ambulatory Visit: Payer: Self-pay

## 2023-03-02 ENCOUNTER — Ambulatory Visit
Admission: RE | Admit: 2023-03-02 | Discharge: 2023-03-02 | Disposition: A | Discharge status: Home / Self Care | Payer: 59 | Attending: Gastroenterology | Admitting: Gastroenterology

## 2023-03-02 DIAGNOSIS — D122 Benign neoplasm of ascending colon: Secondary | ICD-10-CM | POA: Diagnosis not present

## 2023-03-02 DIAGNOSIS — D126 Benign neoplasm of colon, unspecified: Secondary | ICD-10-CM

## 2023-03-02 DIAGNOSIS — D125 Benign neoplasm of sigmoid colon: Secondary | ICD-10-CM | POA: Diagnosis not present

## 2023-03-02 DIAGNOSIS — Z1211 Encounter for screening for malignant neoplasm of colon: Secondary | ICD-10-CM | POA: Diagnosis present

## 2023-03-02 DIAGNOSIS — D128 Benign neoplasm of rectum: Secondary | ICD-10-CM | POA: Diagnosis not present

## 2023-03-02 HISTORY — DX: Type 2 diabetes mellitus without complications: E11.9

## 2023-03-02 HISTORY — PX: POLYPECTOMY: SHX5525

## 2023-03-02 HISTORY — PX: COLONOSCOPY WITH PROPOFOL: SHX5780

## 2023-03-02 HISTORY — DX: Failed or difficult intubation, initial encounter: T88.4XXA

## 2023-03-02 LAB — GLUCOSE, CAPILLARY: Glucose-Capillary: 143 mg/dL — ABNORMAL HIGH (ref 70–99)

## 2023-03-02 SURGERY — COLONOSCOPY WITH PROPOFOL
Anesthesia: General

## 2023-03-02 MED ORDER — SODIUM CHLORIDE 0.9 % IV SOLN: INTRAVENOUS | Status: DC

## 2023-03-02 MED ORDER — PROPOFOL 10 MG/ML IV BOLUS
INTRAVENOUS | Status: DC | PRN
  Administered 2023-03-02: 150 ug/kg/min via INTRAVENOUS

## 2023-03-02 MED ORDER — LIDOCAINE HCL (CARDIAC) PF 100 MG/5ML IV SOSY
PREFILLED_SYRINGE | INTRAVENOUS | Status: DC | PRN
  Administered 2023-03-02: 50 mg via INTRAVENOUS

## 2023-03-02 NOTE — Anesthesia Procedure Notes (Signed)
Procedure Name: MAC Date/Time: 03/02/2023 11:42 AM  Performed by: Elmarie Mainland, CRNAPre-anesthesia Checklist: Patient identified, Emergency Drugs available, Suction available and Patient being monitored Patient Re-evaluated:Patient Re-evaluated prior to induction Oxygen Delivery Method: Nasal cannula

## 2023-03-02 NOTE — Op Note (Signed)
[facility] Patient Name: Karla Carter Procedure Date: 03/02/2023 11:25 AM MRN: [id number] Account #: [id number] Date of Birth: 07-Dec-1955 Admit Type: Outpatient Age: 67 Room: [facility] ENDO ROOM 2 Gender: Female Note Status: Finalized Instrument Name: Prentice Docker [id number] Procedure:             Colonoscopy Indications:           Screening for colorectal malignant neoplasm Providers:             Wyline Mood MD, MD Referring MD:          Leanna Sato, MD (Referring MD) Medicines:             Monitored Anesthesia Care Complications:         No immediate complications. Procedure:             Pre-Anesthesia Assessment:                        - Prior to the procedure, a History and Physical was                         performed, and patient medications, allergies and                         sensitivities were reviewed. The patient's tolerance                         of previous anesthesia was reviewed.                        - The risks and benefits of the procedure and the                         sedation options and risks were discussed with the                         patient. All questions were answered and informed                         consent was obtained.                        - ASA Grade Assessment: II - A patient with mild                         systemic disease.                        After obtaining informed consent, the colonoscope was                         passed under direct vision. Throughout the procedure,                         the patient's blood pressure, pulse, and oxygen                         saturations were monitored continuously. The                         Colonoscope was  introduced through the anus and                         advanced to the the cecum, identified by the                         appendiceal orifice. The colonoscopy was performed                         with ease. The patient tolerated the procedure well.                          The quality of the bowel preparation was excellent.                         The ileocecal valve, appendiceal orifice, and rectum                         were photographed. Findings:      A 15 mm polyp was found in the rectum. The polyp was sessile.       Preparations were made for mucosal resection. Demarcation of the lesion       was performed with high-definition white light to clearly identify the       boundaries of the lesion. Saline was injected to raise the lesion. Snare       mucosal resection was performed. Resection and retrieval were complete.       Resected tissue margins were examined and clear of polyp tissue. To       prevent bleeding after the polypectomy, one hemostatic clip was       successfully placed. Clip manufacturer: [employer]. There was no       bleeding at the end of the procedure.      Seven sessile polyps were found in the ascending colon. The polyps were       6 to 8 mm in size. These polyps were removed with a cold snare.       Resection and retrieval were complete.      A 7 mm polyp was found in the sigmoid colon. The polyp was sessile. The       polyp was removed with a cold snare. Resection and retrieval were       complete.      Two sessile polyps were found in the ascending colon. The polyps were 10       to 12 mm in size. These polyps were removed with a saline injection-lift       technique using a hot snare. Resection and retrieval were complete.      The exam was otherwise without abnormality on direct and retroflexion       views. Impression:            - One 15 mm polyp in the rectum, removed with mucosal                         resection. Resected and retrieved. Clip was placed.                         Clip manufacturer: [employer].                        -  Seven 6 to 8 mm polyps in the ascending colon,                         removed with a cold snare. Resected and retrieved.                        - One 7  mm polyp in the sigmoid colon, removed with a                         cold snare. Resected and retrieved.                        - Two 10 to 12 mm polyps in the ascending colon,                         removed using injection-lift and a hot snare. Resected                         and retrieved.                        - The examination was otherwise normal on direct and                         retroflexion views.                        - Mucosal resection was performed. Resection and                         retrieval were complete. Recommendation:        - Discharge patient to home (with escort).                        - Resume previous diet.                        - Continue present medications.                        - Await pathology results.                        - Repeat colonoscopy in 1 year for surveillance. Procedure Code(s):     --- Professional ---                        [phone], Colonoscopy, flexible; with endoscopic mucosal                         resection                        45385, 59, Colonoscopy, flexible; with removal of                         tumor(s), polyp(s), or other lesion(s) by snare                         technique  44010, 59, Colonoscopy, flexible; with directed                         submucosal injection(s), any substance Diagnosis Code(s):     --- Professional ---                        Z12.11, Encounter for screening for malignant neoplasm                         of colon                        D12.8, Benign neoplasm of rectum                        D12.5, Benign neoplasm of sigmoid colon                        D12.2, Benign neoplasm of ascending colon CPT copyright 2022 American Medical Association. All rights reserved. The codes documented in this report are preliminary and upon coder review may  be revised to meet current compliance requirements. Wyline Mood, MD Wyline Mood MD, MD 03/02/2023 12:13:58 PM This report has been  signed electronically. Number of Addenda: 0 Note Initiated On: 03/02/2023 11:25 AM Scope Withdrawal Time: 0 hours 24 minutes 49 seconds  Total Procedure Duration: 0 hours 27 minutes 51 seconds  Estimated Blood Loss:  Estimated blood loss: none.      [facility]

## 2023-03-02 NOTE — Anesthesia Preprocedure Evaluation (Addendum)
Anesthesia Evaluation  Patient identified by MRN, date of birth, ID band Patient awake    Reviewed: Allergy & Precautions, NPO status , Patient's Chart, lab work & pertinent test results  Airway Mallampati: III  TM Distance: >3 FB Neck ROM: full    Dental  (+) Chipped, Dental Advidsory Given   Pulmonary COPD,  COPD inhaler, Current SmokerPatient did not abstain from smoking.   Pulmonary exam normal        Cardiovascular hypertension, Normal cardiovascular exam     Neuro/Psych  PSYCHIATRIC DISORDERS      negative neurological ROS     GI/Hepatic Neg liver ROS,GERD  Medicated,,  Endo/Other  diabetes    Renal/GU negative Renal ROS  negative genitourinary   Musculoskeletal   Abdominal   Peds  Hematology negative hematology ROS (+)   Anesthesia Other Findings Past Medical History: No date: Anxiety No date: COPD (chronic obstructive pulmonary disease) (HCC) No date: Depression No date: Diabetes mellitus without complication (HCC) No date: Difficult intubation No date: GERD (gastroesophageal reflux disease) No date: Hypertension  Past Surgical History: No date: ABDOMINAL HYSTERECTOMY No date: APPENDECTOMY No date: COLON SURGERY 08/08/2019: LIPOMA EXCISION; Left     Comment:  Procedure: EXCISION LIPOMA Left Arm;  Surgeon: Henrene Dodge, MD;  Location: [facility] ORS;  Service: General;                Laterality: Left; No date: TONSILLECTOMY     Reproductive/Obstetrics negative OB ROS                             Anesthesia Physical Anesthesia Plan  ASA: 3  Anesthesia Plan: General   Post-op Pain Management: Minimal or no pain anticipated   Induction: Intravenous  PONV Risk Score and Plan: 3 and Propofol infusion, TIVA and Ondansetron  Airway Management Planned: Nasal Cannula  Additional Equipment: None  Intra-op Plan:   Post-operative Plan:   Informed Consent: I  have reviewed the patients History and Physical, chart, labs and discussed the procedure including the risks, benefits and alternatives for the proposed anesthesia with the patient or authorized representative who has indicated his/her understanding and acceptance.     Dental advisory given  Plan Discussed with: CRNA and Surgeon  Anesthesia Plan Comments: (Discussed risks of anesthesia with patient, including possibility of difficulty with spontaneous ventilation under anesthesia necessitating airway intervention, PONV, and rare risks such as cardiac or respiratory or neurological events, and allergic reactions. Discussed the role of CRNA in patient's perioperative care. Patient understands.)       Anesthesia Quick Evaluation

## 2023-03-02 NOTE — H&P (Signed)
Wyline Mood, MD [address], [place], [place], [address] [address], [address], [place], [place], [address] Phone: [phone]  Fax: [phone]  Primary Care Physician:  Leanna Sato, MD   Pre-Procedure History & Physical: HPI:  Karla Carter is a 67 y.o. female is here for an colonoscopy.   Past Medical History:  Diagnosis Date   Anxiety    COPD (chronic obstructive pulmonary disease) (HCC)    Depression    Diabetes mellitus without complication (HCC)    Difficult intubation    GERD (gastroesophageal reflux disease)    Hypertension     Past Surgical History:  Procedure Laterality Date   ABDOMINAL HYSTERECTOMY     APPENDECTOMY     COLON SURGERY     LIPOMA EXCISION Left 08/08/2019   Procedure: EXCISION LIPOMA Left Arm;  Surgeon: Henrene Dodge, MD;  Location: [facility] ORS;  Service: General;  Laterality: Left;   TONSILLECTOMY      Prior to Admission medications   Medication Sig Start Date End Date Taking? Authorizing Provider  albuterol (PROAIR HFA) 108 (90 Base) MCG/ACT inhaler Inhale 2 puffs into the lungs every 4 hours as needed. Replaces Ventolin. 07/09/20   Iloabachie, Chioma E, NP  amLODipine (NORVASC) 10 MG tablet TAKE ONE TABLET BY MOUTH EVERY DAY 03/27/22 03/27/23  Pokhrel, Rebekah Chesterfield, MD  ARIPiprazole (ABILIFY) 10 MG tablet Take 10 mg by mouth daily. 03/12/22   [provider]  blood glucose meter kit and supplies KIT Dispense based on patient and insurance preference. Use up to four times daily as directed. (FOR ICD-9 250.00, 250.01). 10/24/19   Iloabachie, Chioma E, NP  Blood Pressure Monitor KIT 1 kit by Does not apply route daily. 05/10/19   Iloabachie, Chioma E, NP  DULERA 200-5 MCG/ACT AERO Inhale 2 puffs into the lungs 2 (two) times daily. 03/22/22   [provider]  fenofibrate (TRICOR) 48 MG tablet TAKE ONE TABLET BY MOUTH EVERY DAY 03/27/22 03/27/23  Pokhrel, Rebekah Chesterfield, MD  glipiZIDE (GLUCOTROL) 5 MG tablet TAKE HALF A TABLET  (2.5 MG) BY MOUTH IN THE MORNING AND 1 TABLET (5 MG) AT NIGHT 03/27/22 03/27/23  Pokhrel, Laxman, MD  guaiFENesin-dextromethorphan (ROBITUSSIN DM) 100-10 MG/5ML syrup Take 10 mLs by mouth every 4 (four) hours as needed for cough. 03/27/22   Pokhrel, Rebekah Chesterfield, MD  JARDIANCE 10 MG TABS tablet Take 10 mg by mouth daily. 02/11/22   [provider]  lisinopril (ZESTRIL) 5 MG tablet TAKE ONE TABLET BY MOUTH EVERY DAY 03/27/22 03/27/23  Pokhrel, Rebekah Chesterfield, MD  MELATONIN PO Take 40 mg by mouth at bedtime as needed (Sleep).     [provider]  metFORMIN (GLUCOPHAGE-XR) 500 MG 24 hr tablet Take 500 mg by mouth 2 (two) times daily. 02/11/22   [provider]  mirtazapine (REMERON) 15 MG tablet TAKE ONE TABLET BY MOUTH AT BEDTIME 03/27/22 11/14/22  Pokhrel, Laxman, MD  mometasone-formoterol (DULERA) 100-5 MCG/ACT AERO Inhale 2 puffs into the lungs 2 (two) times daily. 07/31/15   Houston Siren, MD  pantoprazole (PROTONIX) 40 MG tablet TAKE ONE TABLET BY MOUTH EVERY DAY 03/27/22 03/27/23  Pokhrel, Rebekah Chesterfield, MD  pravastatin (PRAVACHOL) 40 MG tablet Take 40 mg by mouth daily. 02/11/22   [provider]  sertraline (ZOLOFT) 50 MG tablet TAKE 3 TABLETS BY MOUTH EVERY DAY 03/27/22 09/14/22  Pokhrel, Rebekah Chesterfield, MD  Vitamin D, Ergocalciferol, (DRISDOL) 1.25 MG (50000 UNIT) CAPS capsule Take 50,000 Units by mouth once a week. 12/25/21  [provider]  zolpidem (AMBIEN) 10 MG tablet Take 10 mg by mouth at bedtime as needed. 03/02/22   [provider]  Fluticasone-Salmeterol (ADVAIR) 100-50 MCG/DOSE AEPB Inhale 1 puff into the lungs 2 (two) times daily. You can start using Advair  after you ran out of [facility]. 06/25/20 08/07/20  Hartley Barefoot A, MD    Allergies as of 01/22/2023 - Review Complete 01/21/2023  Allergen Reaction Noted   Metformin and related  09/21/2019   Codeine Rash 07/25/2015    Family History  Problem Relation Age of Onset   Cancer Father    Cancer Maternal  Aunt     Social History   Socioeconomic History   Marital status: Married    Spouse name: Not on file   Number of children: 2   Years of education: Not on file   Highest education level: Not on file  Occupational History   Not on file  Tobacco Use   Smoking status: Every Day    Current packs/day: 0.75    Average packs/day: 0.8 packs/day for 48.0 years (36.0 ttl pk-yrs)    Types: Cigarettes   Smokeless tobacco: Never  Vaping Use   Vaping status: Never Used  Substance and Sexual Activity   Alcohol use: No   Drug use: Never   Sexual activity: Not on file  Other Topics Concern   Not on file  Social History Narrative   Social determinant screening completed. Patient is in stable housing, is on food stamps, social security, and retirement. Patient does not require a referral to [facility] at this point in time. Follows therapist at [facility] for mental health. HS   Social Determinants of Health   Financial Resource Strain: Low Risk  (09/12/2019)   Overall Financial Resource Strain (CARDIA)    Difficulty of Paying Living Expenses: Not very hard  Food Insecurity: No Food Insecurity (03/26/2022)   Hunger Vital Sign    Worried About Running Out of Food in the Last Year: Never true    Ran Out of Food in the Last Year: Never true  Transportation Needs: No Transportation Needs (03/26/2022)   PRAPARE - Administrator, Civil Service (Medical): No    Lack of Transportation (Non-Medical): No  Physical Activity: Inactive (09/12/2019)   Exercise Vital Sign    Days of Exercise per Week: 0 days    Minutes of Exercise per Session: 0 min  Stress: Stress Concern Present (09/12/2019)   [employer] of Occupational Health - Occupational Stress Questionnaire    Feeling of Stress : To some extent  Social Connections: Socially Integrated (09/12/2019)   Social Connection and Isolation Panel [NHANES]    Frequency of Communication with Friends and Family: More than three times a week     Frequency of Social Gatherings with Friends and Family: More than three times a week    Attends Religious Services: 1 to 4 times per year    Active Member of [employer] or Organizations: Yes    Attends Banker Meetings: 1 to 4 times per year    Marital Status: Married  Catering manager Violence: Not At Risk (03/26/2022)   Humiliation, Afraid, Rape, and Kick questionnaire    Fear of Current or Ex-Partner: No    Emotionally Abused: No    Physically Abused: No    Sexually Abused: No    Review of Systems: See HPI, otherwise negative ROS  Physical Exam: There were no vitals taken for this visit. General:  Alert,  pleasant and cooperative in NAD Head:  Normocephalic and atraumatic. Neck:  Supple; no masses or thyromegaly. Lungs:  Clear throughout to auscultation, normal respiratory effort.    Heart:  +S1, +S2, Regular rate and rhythm, No edema. Abdomen:  Soft, nontender and nondistended. Normal bowel sounds, without guarding, and without rebound.   Neurologic:  Alert and  oriented x4;  grossly normal neurologically.  Impression/Plan: Karla Carter is here for an colonoscopy to be performed for Screening colonoscopy average risk   Risks, benefits, limitations, and alternatives regarding  colonoscopy have been reviewed with the patient.  Questions have been answered.  All parties agreeable.   Wyline Mood, MD  03/02/2023, 10:54 AM

## 2023-03-02 NOTE — Anesthesia Postprocedure Evaluation (Signed)
Anesthesia Post Note  Patient: Karla Carter  Procedure(s) Performed: COLONOSCOPY WITH PROPOFOL  Patient location during evaluation: PACU Anesthesia Type: General Level of consciousness: awake and alert Pain management: pain level controlled Vital Signs Assessment: post-procedure vital signs reviewed and stable Respiratory status: spontaneous breathing, nonlabored ventilation, respiratory function stable and patient connected to nasal cannula oxygen Cardiovascular status: blood pressure returned to baseline and stable Postop Assessment: no apparent nausea or vomiting Anesthetic complications: no  No notable events documented.   Last Vitals:  Vitals:   03/02/23 1215 03/02/23 1224  BP:  (!) 163/75  Pulse: 68 66  Resp: 18 (!) 21  Temp:    SpO2: 97% 98%    Last Pain:  Vitals:   03/02/23 1224  TempSrc:   PainSc: 0-No pain                 Stephanie Coup

## 2023-03-02 NOTE — Transfer of Care (Signed)
Immediate Anesthesia Transfer of Care Note  Patient: Karla Carter  Procedure(s) Performed: COLONOSCOPY WITH PROPOFOL  Patient Location: PACU and Endoscopy Unit  Anesthesia Type:General  Level of Consciousness: awake, drowsy, and patient cooperative  Airway & Oxygen Therapy: Patient Spontanous Breathing  Post-op Assessment: Report given to RN and Post -op Vital signs reviewed and stable  Post vital signs: Reviewed and stable  Last Vitals:  Vitals Value Taken Time  BP    Temp    Pulse    Resp    SpO2      Last Pain:  Vitals:   03/02/23 1117  TempSrc: Temporal  PainSc: 0-No pain         Complications: No notable events documented.

## 2023-03-03 ENCOUNTER — Encounter: Payer: Self-pay | Admitting: Gastroenterology

## 2023-03-04 ENCOUNTER — Encounter: Payer: Self-pay | Admitting: Gastroenterology

## 2023-03-04 ENCOUNTER — Telehealth: Payer: Self-pay

## 2023-03-04 DIAGNOSIS — Z86010 Personal history of colonic polyps: Secondary | ICD-10-CM

## 2023-03-04 NOTE — Telephone Encounter (Signed)
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

## 2023-03-04 NOTE — Telephone Encounter (Signed)
Placed referral and sent patient a mychart message

## 2023-03-15 ENCOUNTER — Telehealth: Payer: Self-pay | Admitting: Genetic Counselor

## 2023-03-15 ENCOUNTER — Ambulatory Visit: Payer: 59 | Admitting: Cardiology

## 2023-03-15 NOTE — Telephone Encounter (Signed)
Left Message - Per IB message on 9/23: I left patient a message with the date and time of her Genetic counseling and lab appointment. History of colonic polyps Scheduling Notes: Genetics per Wyline Mood. Referring provider referred to Lacy Duverney ( or next available)

## 2023-03-16 ENCOUNTER — Telehealth: Payer: Self-pay | Admitting: Licensed Clinical Social Worker

## 2023-03-16 NOTE — Telephone Encounter (Signed)
Communication received from [facility] that patient would prefer to be seen in [place]. Attempt made to reach patient, VM left my direct line.

## 2023-03-24 ENCOUNTER — Inpatient Hospital Stay: Payer: 59 | Attending: Genetic Counselor | Admitting: Genetic Counselor

## 2023-03-24 ENCOUNTER — Inpatient Hospital Stay: Payer: 59

## 2023-03-24 ENCOUNTER — Other Ambulatory Visit: Payer: Self-pay | Admitting: Genetic Counselor

## 2023-03-24 DIAGNOSIS — D126 Benign neoplasm of colon, unspecified: Secondary | ICD-10-CM

## 2023-03-31 ENCOUNTER — Inpatient Hospital Stay: Payer: 59 | Admitting: Licensed Clinical Social Worker

## 2023-03-31 ENCOUNTER — Inpatient Hospital Stay: Payer: 59

## 2023-03-31 NOTE — Progress Notes (Deleted)
REFERRING PROVIDER: Wyline Mood, MD [address] [place],  [place] [address]  PRIMARY PROVIDER:  Leanna Sato, MD  PRIMARY REASON FOR VISIT:  1. Personal history of colon polyps, unspecified      HISTORY OF PRESENT ILLNESS:   Ms. Klier, a 67 y.o. female, was seen for a [place] cancer genetics consultation at the request of Dr. Tobi Bastos due to a personal history of colon polyps.  Ms. Chea presents to clinic today to discuss the possibility of a hereditary predisposition to cancer, genetic testing, and to further clarify her future cancer risks, as well as potential cancer risks for family members.    CANCER HISTORY:  Ms. Dietzman is a 67 y.o. female with no personal history of cancer.    RISK FACTORS:  Menarche was at age ***.  First live birth at age ***.  Ovaries intact: {Yes/No-Ex:120004}.  Hysterectomy: yes.  Menopausal status: postmenopausal.  HRT use: {Numbers 1-12 multi-select:20307} years. Colonoscopy: {Yes/No-Ex:120004}; {normal/abnormal/not examined:14677}. Mammogram within the last year: {Yes/No-Ex:120004}. Number of breast biopsies: {Numbers 1-12 multi-select:20307}.   Past Medical History:  Diagnosis Date   Anxiety    COPD (chronic obstructive pulmonary disease) (HCC)    Depression    Diabetes mellitus without complication (HCC)    Difficult intubation    GERD (gastroesophageal reflux disease)    Hypertension     Past Surgical History:  Procedure Laterality Date   ABDOMINAL HYSTERECTOMY     APPENDECTOMY     COLON SURGERY     COLONOSCOPY WITH PROPOFOL N/A 03/02/2023   Procedure: COLONOSCOPY WITH PROPOFOL;  Surgeon: Wyline Mood, MD;  Location: [facility] ENDOSCOPY;  Service: Gastroenterology;  Laterality: N/A;   LIPOMA EXCISION Left 08/08/2019   Procedure: EXCISION LIPOMA Left Arm;  Surgeon: Henrene Dodge, MD;  Location: [facility] ORS;  Service: General;  Laterality: Left;   POLYPECTOMY  03/02/2023   Procedure: POLYPECTOMY;  Surgeon: Wyline Mood, MD;   Location[facility] END[facility]COPY;  Service: Gastroenterology;;   TONSILLECTOMY      FAMILY HISTORY:  We obtained a detailed, 4-generation family history.  Significant diagnoses are listed below: Family History  Problem Relation Age of Onset   Cancer Father    Cancer Maternal Aunt     Ms. Bencomo is unaware of previous family history of genetic testing for hereditary cancer risks. There is no reported Ashkenazi Jewish ancestry. There is no known consanguinity.  GENETIC COUNSELING ASSESSMENT: Ms. Eskenazi is a 67 y.o. female with a personal history of >10 colon polyps which is somewhat suggestive of a hereditary polyposis/cancer syndrome and predisposition to cancer. We, therefore, discussed and recommended the following at today's visit.   DISCUSSION: We discussed that polyps in general are common, however, most people have fewer than 5 lifetime polyps.  When an individual has 10 or more polyps we become concerned about an underlying polyposis syndrome.  The most common hereditary polyposis syndromes are caused by problems in the APC and MUTYH genes. We discussed that testing is beneficial for several reasons including knowing how to follow individuals for cancer screenings, and understand if other family members could be at risk for cancer and allow them to undergo genetic testing.   We reviewed the characteristics, features and inheritance patterns of hereditary cancer syndromes. We also discussed genetic testing, including the appropriate family members to test, the process of testing, insurance coverage and turn-around-time for results. We discussed the implications of a negative, positive and/or variant of uncertain significant result. We recommended Ms. Todaro pursue genetic  testing for the [facility] Multi-Cancer+RNA gene panel.   Based on Ms. Delaguila's personal history of colon polyps, she meets medical criteria for genetic testing. Despite that she meets criteria, she may still have an out of pocket cost.    PLAN: After considering the risks, benefits, and limitations, Ms. Chopin provided informed consent to pursue genetic testing and the blood sample was sent to [facility] for analysis of the Multi-Cancer+RNA panel. Results should be available within approximately 2-3 weeks' time, at which point they will be disclosed by telephone to Ms. Mansouri, as will any additional recommendations warranted by these results. Ms. Truby will receive a summary of her genetic counseling visit and a copy of her results once available. This information will also be available in Epic.   Ms. Bradshaw questions were answered to her satisfaction today. Our contact information was provided should additional questions or concerns arise. Thank you for the referral and allowing [place] to share in the care of your patient.   Lacy Duverney, MS, [facility] Genetic Counselor [email or web address] Phone: [phone]  The patient was seen for a total of *** minutes in face-to-face genetic counseling.  Dr. Blake Divine was available for discussion regarding this case.   _______________________________________________________________________ For Office Staff:  Number of people involved in session: *** Was an Intern/ student involved with case: no

## 2023-04-02 ENCOUNTER — Ambulatory Visit: Payer: 59 | Admitting: Cardiology

## 2023-04-21 ENCOUNTER — Other Ambulatory Visit: Payer: Self-pay

## 2023-04-21 ENCOUNTER — Encounter: Payer: 59 | Admitting: Licensed Clinical Social Worker

## 2023-04-26 ENCOUNTER — Ambulatory Visit: Payer: 59 | Admitting: Cardiology

## 2023-04-27 ENCOUNTER — Encounter: Payer: Self-pay | Admitting: Licensed Clinical Social Worker

## 2023-04-27 ENCOUNTER — Inpatient Hospital Stay (HOSPITAL_BASED_OUTPATIENT_CLINIC_OR_DEPARTMENT_OTHER): Payer: 59 | Attending: Genetic Counselor | Admitting: Licensed Clinical Social Worker

## 2023-04-27 ENCOUNTER — Inpatient Hospital Stay: Payer: 59

## 2023-04-27 DIAGNOSIS — Z801 Family history of malignant neoplasm of trachea, bronchus and lung: Secondary | ICD-10-CM

## 2023-04-27 DIAGNOSIS — Z860100 Personal history of colon polyps, unspecified: Secondary | ICD-10-CM

## 2023-04-27 NOTE — Progress Notes (Signed)
REFERRING PROVIDER: Wyline Mood, MD [address] [place],  [place] [address]  PRIMARY PROVIDER:  Leanna Sato, MD  PRIMARY REASON FOR VISIT:  No diagnosis found.    HISTORY OF PRESENT ILLNESS:   Karla Carter, a 67 y.o. female, was seen for a [facility] [place] cancer genetics consultation at the request of Dr. Tobi Bastos due to a personal history of colon polyps.  Karla Carter presents to clinic today to discuss the possibility of a hereditary predisposition to cancer, genetic testing, and to further clarify her future cancer risks, as well as potential cancer risks for family members.    CANCER HISTORY:  Karla Carter is a 67 y.o. female with no personal history of cancer.    RISK FACTORS:  Menarche was at age 57.  First live birth at age 32.  Ovaries intact: no.  Hysterectomy: yes.  Menopausal status: postmenopausal.  HRT use: 0 years. Colonoscopy: yes;  11 polyps on 2024 colonoscopy . Mammogram within the last year: no. Number of breast biopsies: 0.   Past Medical History:  Diagnosis Date   Anxiety    COPD (chronic obstructive pulmonary disease) (HCC)    Depression    Diabetes mellitus without complication (HCC)    Difficult intubation    GERD (gastroesophageal reflux disease)    Hypertension     Past Surgical History:  Procedure Laterality Date   ABDOMINAL HYSTERECTOMY     APPENDECTOMY     COLON SURGERY     COLONOSCOPY WITH PROPOFOL N/A 03/02/2023   Procedure: COLONOSCOPY WITH PROPOFOL;  Surgeon: Wyline Mood, MD;  Location: [facility] ENDOSCOPY;  Service: Gastroenterology;  Laterality: N/A;   LIPOMA EXCISION Left 08/08/2019   Procedure: EXCISION LIPOMA Left Arm;  Surgeon: Henrene Dodge, MD;  Location: [facility] ORS;  Service: General;  Laterality: Left;   POLYPECTOMY  03/02/2023   Procedure: POLYPECTOMY;  Surgeon: Wyline Mood, MD;  Location: [facility] ENDOSCOPY;  Service: Gastroenterology;;   TONSILLECTOMY      FAMILY HISTORY:  We obtained a detailed, 4-generation family history.   Significant diagnoses are listed below: Family History  Problem Relation Age of Onset   Lung cancer Father 39   Cancer Maternal Aunt        unk type   Karla Carter has 2 sons, 28 and 45. Her older son has had colonoscopy, few polyps. She has 3 sisters, no cancers.  Karla Carter mother passed this year at 59, no cancer history. A maternal aunt had cancer, unknown type.  Karla Carter father had lung cancer at 62 and passed from complication during surgery for this at 81.   Karla Carter is unaware of previous family history of genetic testing for hereditary cancer risks. There is no reported Ashkenazi Jewish ancestry. There is no known consanguinity.    GENETIC COUNSELING ASSESSMENT: Karla Carter is a 67 y.o. female with a personal history of >10 colon polyps which is somewhat suggestive of a hereditary polyposis/cancer syndrome and predisposition to cancer. We, therefore, discussed and recommended the following at today's visit.   DISCUSSION: We discussed that polyps in general are common, however, most people have fewer than 5 lifetime polyps.  When an individual has 10 or more polyps we become concerned about an underlying polyposis syndrome.  The most common hereditary polyposis syndromes are caused by problems in the APC and MUTYH genes. We discussed that testing is beneficial for several reasons including knowing how to follow individuals for cancer screenings, and understand if other family members could be  at risk for cancer and allow them to undergo genetic testing.   We reviewed the characteristics, features and inheritance patterns of hereditary cancer syndromes. We also discussed genetic testing, including the appropriate family members to test, the process of testing, insurance coverage and turn-around-time for results. We discussed the implications of a negative, positive and/or variant of uncertain significant result. We recommended Karla Carter pursue genetic testing for the [facility]  Multi-Cancer+RNA gene panel.   Based on Karla Carter's personal history of colon polyps, she meets medical criteria for genetic testing. Despite that she meets criteria, she may still have an out of pocket cost.   PLAN: After considering the risks, benefits, and limitations, Karla Carter provided informed consent to pursue genetic testing and the blood sample was sent to [facility] for analysis of the Multi-Cancer+RNA panel. Results should be available within approximately 2-3 weeks' time, at which point they will be disclosed by telephone to Karla Carter, as will any additional recommendations warranted by these results. Karla Carter will receive a summary of her genetic counseling visit and a copy of her results once available. This information will also be available in Epic.   Karla Carter questions were answered to her satisfaction today. Our contact information was provided should additional questions or concerns arise. Thank you for the referral and allowing [place] to share in the care of your patient.   Lacy Duverney, MS, [facility] Genetic Counselor [email or web address] Phone: [phone]  The patient was seen for a total of 25 minutes in face-to-face genetic counseling.  Dr. Blake Divine was available for discussion regarding this case.   _______________________________________________________________________ For Office Staff:  Number of people involved in session: 1 Was an Intern/ student involved with case: no

## 2023-05-06 ENCOUNTER — Telehealth: Payer: Self-pay | Admitting: Licensed Clinical Social Worker

## 2023-05-06 ENCOUNTER — Encounter: Payer: Self-pay | Admitting: Licensed Clinical Social Worker

## 2023-05-06 ENCOUNTER — Ambulatory Visit: Payer: Self-pay | Admitting: Licensed Clinical Social Worker

## 2023-05-06 DIAGNOSIS — Z1379 Encounter for other screening for genetic and chromosomal anomalies: Secondary | ICD-10-CM

## 2023-05-06 NOTE — Telephone Encounter (Signed)
I contacted Ms. Bartles to discuss her genetic testing results. No pathogenic variants were identified in the 48 genes analyzed. Detailed clinic note to follow.   The test report has been scanned into EPIC and is located under the Molecular Pathology section of the Results Review tab.  A portion of the result report is included below for reference.      Lacy Duverney, MS, [facility] Genetic Counselor [email or web address] Phone: [phone]

## 2023-05-06 NOTE — Progress Notes (Signed)
HPI:   Karla Carter was previously seen in the [facility] due to a personal history of colon polyps and concerns regarding a hereditary predisposition to cancer. Please refer to our prior cancer genetics clinic note for more information regarding our discussion, assessment and recommendations, at the time. Karla Carter recent genetic test results were disclosed to her, as were recommendations warranted by these results. These results and recommendations are discussed in more detail below.  CANCER HISTORY:  Oncology History   No history exists.    FAMILY HISTORY:  We obtained a detailed, 4-generation family history.  Significant diagnoses are listed below: Family History  Problem Relation Age of Onset   Lung cancer Father 26   Cancer Maternal Aunt        unk type   Karla Carter has 2 sons, 51 and 45. Her older son has had colonoscopy, few polyps. She has 3 sisters, no cancers.   Karla Carter mother passed this year at 46, no cancer history. A maternal aunt had cancer, unknown type.   Karla Carter father had lung cancer at 19 and passed from complication during surgery for this at 27.    Karla Carter is unaware of previous family history of genetic testing for hereditary cancer risks. There is no reported Ashkenazi Jewish ancestry. There is no known consanguinity.      GENETIC TEST RESULTS:  The [facility] Common Hereditary Cancers+RNA Panel found no pathogenic mutations.   The Common Hereditary Cancers Panel + RNA offered by [facility] includes sequencing and/or deletion duplication testing of the following 48 genes: APC*, ATM*, AXIN2, BAP1, BARD1, BMPR1A, BRCA1, BRCA2, BRIP1, CDH1, CDK4, CDKN2A (p14ARF), CDKN2A (p16INK4a), CHEK2, CTNNA1, DICER1*, EPCAM*, FH*, GREM1*, HOXB13, KIT, MBD4, MEN1*, MLH1*, MSH2*, MSH3*, MSH6*, MUTYH, NF1*, NTHL1, PALB2, PDGFRA, PMS2*, POLD1*, POLE, PTEN*, RAD51C, RAD51D, SDHA*, SDHB, SDHC*, SDHD, SMAD4, SMARCA4, STK11, TP53, TSC1*, TSC2, VHL.   The  test report has been scanned into EPIC and is located under the Molecular Pathology section of the Results Review tab.  A portion of the result report is included below for reference. Genetic testing reported out on 05/05/2023.     Even though a pathogenic variant was not identified, possible explanations for the cancer in the family may include: There may be no hereditary risk for cancer in the family. The cancers in Karla Carter and/or her family may be sporadic/familial or due to other genetic and environmental factors. There may be a gene mutation in one of these genes that current testing methods cannot detect but that chance is small. There could be another gene that has not yet been discovered, or that we have not yet tested, that is responsible for the cancer diagnoses in the family.  It is also possible there is a hereditary cause for the cancer in the family that Karla Carter did not inherit. Therefore, it is important to remain in touch with cancer genetics in the future so that we can continue to offer Karla Carter the most up to date genetic testing.   ADDITIONAL GENETIC TESTING:  We discussed with [name] that her genetic testing was fairly extensive.  If there are additional relevant genes identified to increase cancer risk that can be analyzed in the future, we would be happy to discuss and coordinate this testing at that time.    CANCER SCREENING RECOMMENDATIONS:  [name] test result is considered negative (normal).  This means that we have not identified a hereditary cause for her personal history  of polyps/family history of cancer at this time.   An individual's cancer risk and medical management are not determined by genetic test results alone. Overall cancer risk assessment incorporates additional factors, including personal medical history, family history, and any available genetic information that may result in a personalized plan for cancer prevention and surveillance.  Therefore, it is recommended she continue to follow the cancer management and screening guidelines provided by her  primary healthcare provider.  Based on the reported personal and family history, specific cancer screenings for Karla Carter and her family include:  Colon Cancer Screening: This negative genetic test simply tells [place] that we cannot yet define why Karla Carter has had  an increased number of colorectal polyps. Karla Carter's medical management and screening should be based on the prospect that she  will likely form more colon polyps and should, therefore, undergo more frequent colonoscopy screening at intervals determined by her GI providers.    RECOMMENDATIONS FOR FAMILY MEMBERS:   Since she did not inherit a identifiable mutation in a cancer predisposition gene included on this panel, her children could not have inherited a known mutation from her in one of these genes. Individuals in this family might be at some increased risk of developing cancer, over the general population risk, due to the family history of cancer.  Individuals in the family should notify their providers of the family history of cancer. We recommend women in this family have a yearly mammogram beginning at age 1, or 19 years younger than the earliest onset of cancer, an annual clinical breast exam, and perform monthly breast self-exams.  Family members should have colonoscopies by at age 60, or earlier, as recommended by their providers.  FOLLOW-UP:  Lastly, we discussed with Karla Carter that cancer genetics is a rapidly advancing field and it is possible that new genetic tests will be appropriate for her and/or her family members in the future. We encouraged her to remain in contact with cancer genetics on an annual basis so we can update her personal and family histories and let her know of advances in cancer genetics that may benefit this family.   Our contact number was provided. Karla Carter questions were  answered to her satisfaction, and she knows she is welcome to call us at anytime with additional questions or concerns.    Lacy Duverney, MS, [facility] Genetic Counselor [email or web address] Phone: [phone]

## 2023-07-08 ENCOUNTER — Encounter: Payer: Self-pay | Admitting: Cardiology

## 2023-07-08 ENCOUNTER — Ambulatory Visit: Payer: 59 | Attending: Cardiology | Admitting: Cardiology

## 2023-07-08 VITALS — BP 126/64 | HR 61 | Ht 64.0 in | Wt 156.0 lb

## 2023-07-08 DIAGNOSIS — I10 Essential (primary) hypertension: Secondary | ICD-10-CM

## 2023-07-08 DIAGNOSIS — R072 Precordial pain: Secondary | ICD-10-CM

## 2023-07-08 DIAGNOSIS — F17200 Nicotine dependence, unspecified, uncomplicated: Secondary | ICD-10-CM | POA: Diagnosis not present

## 2023-07-08 DIAGNOSIS — E782 Mixed hyperlipidemia: Secondary | ICD-10-CM

## 2023-07-08 MED ORDER — METOPROLOL TARTRATE 50 MG PO TABS: ORAL_TABLET | ORAL | 0 refills | Status: DC

## 2023-07-08 NOTE — Patient Instructions (Addendum)
Medication Instructions:  Take Metoprolol 50 mg once two hours before CT when scheduled.   *If you need a refill on your cardiac medications before your next appointment, please call your pharmacy*   Lab Work: Your provider would like for you to have following labs drawn today BMET, LIPID.   If you have labs (blood work) drawn today and your tests are completely normal, you will receive your results only by: MyChart Message (if you have MyChart) OR A paper copy in the mail If you have any lab test that is abnormal or we need to change your treatment, we will call you to review the results.   Testing/Procedures: Coronary CTA- they will call you to schedule. See information below.  Your physician has requested that you have an echocardiogram. Echocardiography is a painless test that uses sound waves to create images of your heart. It provides your doctor with information about the size and shape of your heart and how well your heart's chambers and valves are working.   You may receive an ultrasound enhancing agent through an IV if needed to better visualize your heart during the echo. This procedure takes approximately one hour.  There are no restrictions for this procedure.  This will take place at [address] [facility] [address], [place] [address]  Please note: We ask at that you not bring children with you during ultrasound (echo/ vascular) testing. Due to room size and safety concerns, children are not allowed in the ultrasound rooms during exams. Our front office staff cannot provide observation of children in our lobby area while testing is being conducted. An adult accompanying a patient to their appointment will only be allowed in the ultrasound room at the discretion of the ultrasound technician under special circumstances. We apologize for any inconvenience.    Follow-Up: At [facility], you and your health needs are our priority.  As part of our  continuing mission to provide you with exceptional heart care, we have created designated Provider Care Teams.  These Care Teams include your primary Cardiologist (physician) and Advanced Practice Providers (APPs -  Physician Assistants and Nurse Practitioners) who all work together to provide you with the care you need, when you need it.  We recommend signing up for the patient portal called "MyChart".  Sign up information is provided on this After Visit Summary.  MyChart is used to connect with patients for Virtual Visits (Telemedicine).  Patients are able to view lab/test results, encounter notes, upcoming appointments, etc.  Non-urgent messages can be sent to your provider as well.   To learn more about what you can do with MyChart, go to [email or web address].    Your next appointment:   3 month(s)  Provider:   Debbe Odea, MD    Other Instructions   Your cardiac CT will be scheduled at one of the below locations:   [facility] [address] [place], [place] [address] [phone]  OR  [facility] [address] [place], [place] [address] [phone]  OR   [facility] [address] [place], [place] [address] [phone]  OR   [facility] [address] [place], [place] [address] [phone]   If scheduled at [facility] or [facility], please arrive 15 mins early for check-in and test prep.  There is spacious parking and easy access to the radiology department from the [facility] Heart  and Vascular entrance. Please enter here and check-in with the desk attendant.   Please follow these instructions carefully (unless otherwise directed):  An IV will be required for this test and Nitroglycerin will be given.    On the Night Before the Test: Be sure to Drink plenty of water. Do not consume any  caffeinated/decaffeinated beverages or chocolate 12 hours prior to your test. Do not take any antihistamines 12 hours prior to your test.  On the Day of the Test: Drink plenty of water until 1 hour prior to the test. Do not eat any food 1 hour prior to test. You may take your regular medications prior to the test.  Take metoprolol (Lopressor) two hours prior to test. If you take Furosemide/Hydrochlorothiazide/Spironolactone/Chlorthalidone, please HOLD on the morning of the test. Patients who wear a continuous glucose monitor MUST remove the device prior to scanning. FEMALES- please wear underwire-free bra if available, avoid dresses & tight clothing      After the Test: Drink plenty of water. After receiving IV contrast, you may experience a mild flushed feeling. This is normal. On occasion, you may experience a mild rash up to 24 hours after the test. This is not dangerous. If this occurs, you can take Benadryl 25 mg and increase your fluid intake. If you experience trouble breathing, this can be serious. If it is severe call 911 IMMEDIATELY. If it is mild, please call our office.  We will call to schedule your test 2-4 weeks out understanding that some insurance companies will need an authorization prior to the service being performed.   For more information and frequently asked questions, please visit our website : [email or web address]  For non-scheduling related questions, please contact the cardiac imaging nurse navigator should you have any questions/concerns: Cardiac Imaging Nurse Navigators Direct Office Dial: [phone]   For scheduling needs, including cancellations and rescheduling, please call [place], [phone].

## 2023-07-08 NOTE — Progress Notes (Signed)
Cardiology Office Note:    Date:  07/08/2023   ID:  Karla Carter, DOB September 10, 1955, MRN [id number]  PCP:  Leanna Sato, MD   [facility] Providers Cardiologist:  None     Referring MD: Leanna Sato, MD   Chief Complaint  Patient presents with   New Patient (Initial Visit)    Referred for cardiac evaluation of Chest pain.  Previously seen by [facility] with last visit 03/2022.  Patient reports intermittent with no known triggers for about 6-12 months.  Last episode last night lasing a few seconds.      History of Present Illness:    Karla Carter is a 68 y.o. female with a hx of hypertension, hyperlipidemia, diabetes, current smoker x 50+ years presenting with chest pain.  States having worsening chest pain over the past 6 to 12 months.  Symptoms are not associated with exertion.  Last occurrence was last evening with patient sitting, lasting a few seconds.  Denies any family history of heart disease.  Current smoker, working on quitting.  Denies edema, palpitations.  Outside echocardiogram 03/2022 EF over 55% Outside stress test 03/2022 normal perfusion with no ischemia, normal EF.  Past Medical History:  Diagnosis Date   Anxiety    COPD (chronic obstructive pulmonary disease) (HCC)    Depression    Diabetes mellitus without complication (HCC)    Difficult intubation    GERD (gastroesophageal reflux disease)    Hypertension     Past Surgical History:  Procedure Laterality Date   ABDOMINAL HYSTERECTOMY     APPENDECTOMY     COLON SURGERY     COLONOSCOPY WITH PROPOFOL N/A 03/02/2023   Procedure: COLONOSCOPY WITH PROPOFOL;  Surgeon: Wyline Mood, MD;  Location: [facility] ENDOSCOPY;  Service: Gastroenterology;  Laterality: N/A;   LIPOMA EXCISION Left 08/08/2019   Procedure: EXCISION LIPOMA Left Arm;  Surgeon: Henrene Dodge, MD;  Location: [facility] ORS;  Service: General;  Laterality: Left;   POLYPECTOMY  03/02/2023   Procedure: POLYPECTOMY;  Surgeon:  Wyline Mood, MD;  Location: [facility] ENDOSCOPY;  Service: Gastroenterology;;   TONSILLECTOMY      Current Medications: Current Meds  Medication Sig   albuterol (PROAIR HFA) 108 (90 Base) MCG/ACT inhaler Inhale 2 puffs into the lungs every 4 hours as needed. Replaces Ventolin.   amLODipine (NORVASC) 10 MG tablet TAKE ONE TABLET BY MOUTH EVERY DAY   ARIPiprazole (ABILIFY) 10 MG tablet Take 10 mg by mouth daily.   blood glucose meter kit and supplies KIT Dispense based on patient and insurance preference. Use up to four times daily as directed. (FOR ICD-9 250.00, 250.01).   Blood Pressure Monitor KIT 1 kit by Does not apply route daily.   DULERA 200-5 MCG/ACT AERO Inhale 2 puffs into the lungs 2 (two) times daily.   fenofibrate (TRICOR) 48 MG tablet TAKE ONE TABLET BY MOUTH EVERY DAY   glipiZIDE (GLUCOTROL) 5 MG tablet TAKE HALF A TABLET (2.5 MG) BY MOUTH IN THE MORNING AND 1 TABLET (5 MG) AT NIGHT   JARDIANCE 10 MG TABS tablet Take 10 mg by mouth daily.   lisinopril (ZESTRIL) 5 MG tablet TAKE ONE TABLET BY MOUTH EVERY DAY   MELATONIN PO Take 40 mg by mouth at bedtime as needed (Sleep).    metFORMIN (GLUCOPHAGE-XR) 500 MG 24 hr tablet Take 500 mg by mouth 2 (two) times daily.   metoprolol tartrate (LOPRESSOR) 50 MG tablet Take 1 tablet by mouth once for procedure.  mirtazapine (REMERON) 15 MG tablet TAKE ONE TABLET BY MOUTH AT BEDTIME   pravastatin (PRAVACHOL) 40 MG tablet Take 40 mg by mouth daily.   sertraline (ZOLOFT) 50 MG tablet TAKE 3 TABLETS BY MOUTH EVERY DAY   zolpidem (AMBIEN) 10 MG tablet Take 10 mg by mouth at bedtime as needed.     Allergies:   Metformin and related and Codeine   Social History   Socioeconomic History   Marital status: Married    Spouse name: Not on file   Number of children: 2   Years of education: Not on file   Highest education level: Not on file  Occupational History   Not on file  Tobacco Use   Smoking status: Every Day    Current packs/day: 0.75     Average packs/day: 0.8 packs/day for 48.0 years (36.0 ttl pk-yrs)    Types: Cigarettes   Smokeless tobacco: Never  Vaping Use   Vaping status: Former  Substance and Sexual Activity   Alcohol use: No   Drug use: Never   Sexual activity: Not on file  Other Topics Concern   Not on file  Social History Narrative   Social determinant screening completed. Patient is in stable housing, is on food stamps, social security, and retirement. Patient does not require a referral to [facility] at this point in time. Follows therapist at [facility] for mental health. HS   Social Drivers of Corporate investment banker Strain: Low Risk  (09/12/2019)   Overall Financial Resource Strain (CARDIA)    Difficulty of Paying Living Expenses: Not very hard  Food Insecurity: No Food Insecurity (03/26/2022)   Hunger Vital Sign    Worried About Running Out of Food in the Last Year: Never true    Ran Out of Food in the Last Year: Never true  Transportation Needs: No Transportation Needs (03/26/2022)   PRAPARE - Administrator, Civil Service (Medical): No    Lack of Transportation (Non-Medical): No  Physical Activity: Inactive (09/12/2019)   Exercise Vital Sign    Days of Exercise per Week: 0 days    Minutes of Exercise per Session: 0 min  Stress: Stress Concern Present (09/12/2019)   [employer] of Occupational Health - Occupational Stress Questionnaire    Feeling of Stress : To some extent  Social Connections: Socially Integrated (09/12/2019)   Social Connection and Isolation Panel [NHANES]    Frequency of Communication with Friends and Family: More than three times a week    Frequency of Social Gatherings with Friends and Family: More than three times a week    Attends Religious Services: 1 to 4 times per year    Active Member of [employer] or Organizations: Yes    Attends Banker Meetings: 1 to 4 times per year    Marital Status: Married     Family History: The patient's family  history includes Cancer in her maternal aunt; Lung cancer (age of onset: 80) in her father.  ROS:   Please see the history of present illness.     All other systems reviewed and are negative.  EKGs/Labs/Other Studies Reviewed:    The following studies were reviewed today:  EKG Interpretation Date/Time:  Thursday July 08 2023 11:16:09 EST Ventricular Rate:  61 PR Interval:  148 QRS Duration:  100 QT Interval:  418 QTC Calculation: 420 R Axis:   63  Text Interpretation: Normal sinus rhythm Normal ECG Confirmed by Debbe Odea ([id number]) on 07/08/2023  11:20:48 AM    Recent Labs: No results found for requested labs within last 365 days.  Recent Lipid Panel    Component Value Date/Time   CHOL 214 (H) 10/16/2020 1159   TRIG 182 (H) 10/16/2020 1159   HDL 37 (L) 10/16/2020 1159   CHOLHDL 5.8 (H) 10/16/2020 1159   LDLCALC 144 (H) 10/16/2020 1159     Risk Assessment/Calculations:             Physical Exam:    VS:  BP 126/64 (BP Location: Left Arm, Patient Position: Sitting, Cuff Size: Normal)   Pulse 61   Ht 5\' 4"  (1.626 m)   Wt 156 lb (70.8 kg)   BMI 26.78 kg/m     Wt Readings from Last 3 Encounters:  07/08/23 156 lb (70.8 kg)  03/02/23 158 lb (71.7 kg)  03/24/22 153 lb (69.4 kg)     GEN:  Well nourished, well developed in no acute distress HEENT: Normal NECK: No JVD; No carotid bruits CARDIAC: RRR, no murmurs, rubs, gallops RESPIRATORY:  Clear to auscultation without rales, wheezing or rhonchi  ABDOMEN: Soft, non-tender, non-distended MUSCULOSKELETAL:  No edema; No deformity  SKIN: Warm and dry NEUROLOGIC:  Alert and oriented x 3 PSYCHIATRIC:  Normal affect   ASSESSMENT:    1. Precordial pain   2. Primary hypertension   3. Mixed hyperlipidemia   4. Smoking    PLAN:    In order of problems listed above:  Chest pain, several risk factors.  Obtain coronary CTA, repeat echo. Hypertension, BP controlled.  Continue lisinopril 5 mg daily, Norvasc 10  mg daily. Hyperlipidemia, continue Pravachol 40, fenofibrate 48 mg.  Obtain lipid panel today.  Consider switch to high intensity statin if cholesterol not controlled. Current smoker, smoking cessation advised.  Follow-up after cardiac testing     Medication Adjustments/Labs and Tests Ordered: Current medicines are reviewed at length with the patient today.  Concerns regarding medicines are outlined above.  Orders Placed This Encounter  Procedures   CT CORONARY MORPH W/CTA COR W/SCORE W/CA W/CM &/OR WO/CM   Basic metabolic panel   Lipid panel   EKG 12-Lead   ECHOCARDIOGRAM COMPLETE   Meds ordered this encounter  Medications   metoprolol tartrate (LOPRESSOR) 50 MG tablet    Sig: Take 1 tablet by mouth once for procedure.    Dispense:  1 tablet    Refill:  0    Patient Instructions  Medication Instructions:  Take Metoprolol 50 mg once two hours before CT when scheduled.   *If you need a refill on your cardiac medications before your next appointment, please call your pharmacy*   Lab Work: Your provider would like for you to have following labs drawn today BMET, LIPID.   If you have labs (blood work) drawn today and your tests are completely normal, you will receive your results only by: MyChart Message (if you have MyChart) OR A paper copy in the mail If you have any lab test that is abnormal or we need to change your treatment, we will call you to review the results.   Testing/Procedures: Coronary CTA- they will call you to schedule. See information below.  Your physician has requested that you have an echocardiogram. Echocardiography is a painless test that uses sound waves to create images of your heart. It provides your doctor with information about the size and shape of your heart and how well your heart's chambers and valves are working.   You may receive an ultrasound  enhancing agent through an IV if needed to better visualize your heart during the echo. This  procedure takes approximately one hour.  There are no restrictions for this procedure.  This will take place at [address] [facility]) [address], [place] [address]  Please note: We ask at that you not bring children with you during ultrasound (echo/ vascular) testing. Due to room size and safety concerns, children are not allowed in the ultrasound rooms during exams. Our front office staff cannot provide observation of children in our lobby area while testing is being conducted. An adult accompanying a patient to their appointment will only be allowed in the ultrasound room at the discretion of the ultrasound technician under special circumstances. We apologize for any inconvenience.    Follow-Up: At [facility], you and your health needs are our priority.  As part of our continuing mission to provide you with exceptional heart care, we have created designated Provider Care Teams.  These Care Teams include your primary Cardiologist (physician) and Advanced Practice Providers (APPs -  Physician Assistants and Nurse Practitioners) who all work together to provide you with the care you need, when you need it.  We recommend signing up for the patient portal called "MyChart".  Sign up information is provided on this After Visit Summary.  MyChart is used to connect with patients for Virtual Visits (Telemedicine).  Patients are able to view lab/test results, encounter notes, upcoming appointments, etc.  Non-urgent messages can be sent to your provider as well.   To learn more about what you can do with MyChart, go to [email or web address].    Your next appointment:   3 month(s)  Provider:   Debbe Odea, MD    Other Instructions   Your cardiac CT will be scheduled at one of the below locations:   [facility] [address] [place], [place] [address] [phone]  OR  [facility] [address] [place], [place] [address] [phone]  OR   [facility] [address] [place], [place] [address] [phone]  OR   [facility] [address] [place], [place] [address] [phone]   If scheduled at [facility] or [facility], please arrive 15 mins early for check-in and test prep.  There is spacious parking and easy access to the radiology department from the [facility] entrance. Please enter here and check-in with the desk attendant.   Please follow these instructions carefully (unless otherwise directed):  An IV will be required for this test and Nitroglycerin will be given.    On the Night Before the Test: Be sure to Drink plenty of water. Do not consume any caffeinated/decaffeinated beverages or chocolate 12 hours prior to your test. Do not take any antihistamines 12 hours prior to your test.  On the Day of the Test: Drink plenty of water until 1 hour prior to the test. Do not eat any food 1 hour prior to test. You may take your regular medications prior to the test.  Take metoprolol (Lopressor) two hours prior to test. If you take Furosemide/Hydrochlorothiazide/Spironolactone/Chlorthalidone, please HOLD on the morning of the test. Patients who wear a continuous glucose monitor MUST remove the device prior to scanning. FEMALES- please wear underwire-free bra if available, avoid dresses & tight clothing      After the Test: Drink plenty of water. After receiving IV contrast, you may  experience a mild flushed feeling. This is normal. On occasion, you may experience a mild rash up to 24 hours after the test. This is not dangerous. If this occurs, you can take Benadryl 25 mg and increase your fluid intake. If you experience trouble breathing, this can be serious. If it is severe call 911 IMMEDIATELY. If it is mild, please call our office.  We will call to schedule  your test 2-4 weeks out understanding that some insurance companies will need an authorization prior to the service being performed.   For more information and frequently asked questions, please visit our website : [email or web address]  For non-scheduling related questions, please contact the cardiac imaging nurse navigator should you have any questions/concerns: Cardiac Imaging Nurse Navigators Direct Office Dial: [phone]   For scheduling needs, including cancellations and rescheduling, please call [place], [phone].          Signed, Debbe Odea, MD  07/08/2023 12:12 PM    [facility]

## 2023-07-09 LAB — BASIC METABOLIC PANEL
BUN/Creatinine Ratio: 15 (ref 12–28)
BUN: 18 mg/dL (ref 8–27)
CO2: 19 mmol/L — ABNORMAL LOW (ref 20–29)
Calcium: 10.2 mg/dL (ref 8.7–10.3)
Chloride: 102 mmol/L (ref 96–106)
Creatinine, Ser: 1.24 mg/dL — ABNORMAL HIGH (ref 0.57–1.00)
Glucose: 99 mg/dL (ref 70–99)
Potassium: 4.7 mmol/L (ref 3.5–5.2)
Sodium: 142 mmol/L (ref 134–144)
eGFR: 48 mL/min/{1.73_m2} — ABNORMAL LOW (ref >59)

## 2023-07-09 LAB — LIPID PANEL
Chol/HDL Ratio: 3.6 {ratio} (ref 0.0–4.4)
Cholesterol, Total: 190 mg/dL (ref 100–199)
HDL: 53 mg/dL (ref >39)
LDL Chol Calc (NIH): 112 mg/dL — ABNORMAL HIGH (ref 0–99)
Triglycerides: 142 mg/dL (ref 0–149)
VLDL Cholesterol Cal: 25 mg/dL (ref 5–40)

## 2023-07-12 ENCOUNTER — Other Ambulatory Visit: Payer: Self-pay

## 2023-07-12 MED ORDER — PRAVASTATIN SODIUM 80 MG PO TABS
80.0000 mg | ORAL_TABLET | Freq: Every evening | ORAL | 3 refills | Status: DC
  Filled 2023-07-12: qty 90, 90d supply, fill #0

## 2023-07-14 ENCOUNTER — Telehealth (HOSPITAL_COMMUNITY): Payer: Self-pay | Admitting: *Deleted

## 2023-07-14 NOTE — Telephone Encounter (Signed)
Reaching out to patient to offer assistance regarding upcoming cardiac imaging study; pt verbalizes understanding of appt date/time, parking situation and where to check in, pre-test NPO status and verified current allergies; name and call back number provided for further questions should they arise  Larey Brick RN Navigator Cardiac Imaging [facility] [phone] office [phone] cell  Patient confirms HR is 54.

## 2023-07-15 ENCOUNTER — Ambulatory Visit
Admission: RE | Admit: 2023-07-15 | Discharge: 2023-07-15 | Disposition: A | Discharge status: Home / Self Care | Payer: 59 | Source: Ambulatory Visit | Attending: Cardiology | Admitting: Cardiology

## 2023-07-15 DIAGNOSIS — R072 Precordial pain: Secondary | ICD-10-CM | POA: Diagnosis present

## 2023-07-15 MED ORDER — IOHEXOL 350 MG/ML SOLN
75.0000 mL | Freq: Once | INTRAVENOUS | Status: AC | PRN
  Administered 2023-07-15: 75 mL via INTRAVENOUS

## 2023-07-15 MED ORDER — NITROGLYCERIN 0.4 MG SL SUBL
0.4000 mg | SUBLINGUAL_TABLET | Freq: Once | SUBLINGUAL | Status: AC
  Administered 2023-07-15: 0.4 mg via SUBLINGUAL

## 2023-07-15 MED ORDER — SODIUM CHLORIDE 0.9 % IV SOLN: INTRAVENOUS | Status: DC

## 2023-07-15 NOTE — Progress Notes (Signed)
Patient tolerated CT well. Drank water after. Vital signs stable encourage to drink water throughout day.Reasons explained and verbalized understanding. Ambulated steady gait.

## 2023-07-19 ENCOUNTER — Other Ambulatory Visit: Payer: Self-pay

## 2023-07-19 MED ORDER — ATORVASTATIN CALCIUM 80 MG PO TABS
80.0000 mg | ORAL_TABLET | Freq: Every day | ORAL | 3 refills | Status: AC
Start: 2023-07-19 — End: 2024-02-03

## 2023-07-21 ENCOUNTER — Other Ambulatory Visit: Payer: Self-pay

## 2023-07-22 ENCOUNTER — Ambulatory Visit: Payer: 59 | Attending: Cardiology

## 2023-07-22 DIAGNOSIS — R072 Precordial pain: Secondary | ICD-10-CM | POA: Diagnosis not present

## 2023-07-25 LAB — ECHOCARDIOGRAM COMPLETE
AR max vel: 2.74 cm2
AV Area VTI: 3 cm2
AV Area mean vel: 2.92 cm2
AV Mean grad: 4 mm[Hg]
AV Peak grad: 8.3 mm[Hg]
Ao pk vel: 1.44 m/s
Area-P 1/2: 2.87 cm2
Calc EF: 58.2 %
S' Lateral: 3.2 cm
Single Plane A2C EF: 57.6 %
Single Plane A4C EF: 60.8 %

## 2023-10-08 ENCOUNTER — Ambulatory Visit: Payer: 59 | Attending: Cardiology | Admitting: Cardiology

## 2023-10-08 ENCOUNTER — Encounter: Payer: Self-pay | Admitting: Cardiology

## 2023-10-08 VITALS — BP 119/78 | HR 72 | Wt 143.0 lb

## 2023-10-08 DIAGNOSIS — I10 Essential (primary) hypertension: Secondary | ICD-10-CM | POA: Diagnosis not present

## 2023-10-08 DIAGNOSIS — F17210 Nicotine dependence, cigarettes, uncomplicated: Secondary | ICD-10-CM | POA: Diagnosis not present

## 2023-10-08 DIAGNOSIS — I2510 Atherosclerotic heart disease of native coronary artery without angina pectoris: Secondary | ICD-10-CM

## 2023-10-08 DIAGNOSIS — J439 Emphysema, unspecified: Secondary | ICD-10-CM

## 2023-10-08 DIAGNOSIS — E782 Mixed hyperlipidemia: Secondary | ICD-10-CM

## 2023-10-08 DIAGNOSIS — J438 Other emphysema: Secondary | ICD-10-CM

## 2023-10-08 MED ORDER — ASPIRIN 81 MG PO TBEC: 81.0000 mg | DELAYED_RELEASE_TABLET | Freq: Every day | ORAL | Status: AC

## 2023-10-08 MED ORDER — ISOSORBIDE MONONITRATE ER 30 MG PO TB24: 30.0000 mg | ORAL_TABLET | Freq: Every day | ORAL | 3 refills | Status: AC

## 2023-10-08 NOTE — Progress Notes (Signed)
 Cardiology Office Note:    Date:  10/08/2023   ID:  Karla Carter, DOB 1955-07-14, MRN [id number]  PCP:  Macie Saxon, MD   [facility] Providers Cardiologist:  Constancia Delton, MD     Referring MD: Macie Saxon, MD   Chief Complaint  Patient presents with   Follow-up    Had some chest pain on her left side last night, medication reviewed verbally with patient     History of Present Illness:    Karla Carter is a 68 y.o. female with a hx of hypertension, hyperlipidemia, diabetes, current smoker x 50+ years, emphysema presenting for follow-up.  Deviously seen due to symptoms of chest pain.  Echo and coronary CT obtained to evaluate cardiac function.  CT showed nonobstructive CAD with mild stenosis in RCA, left circumflex, LAD.  Pravachol  was changed to Lipitor 80.  She states taking rosuvastatin .  Not sure of the dose.  Still has occasional chest pain not associated with exertion.  She still smokes, is working on quitting.  Prior notes/testing Outside echocardiogram 03/2022 EF over 55% Outside stress test 03/2022 normal perfusion with no ischemia, normal EF.  Past Medical History:  Diagnosis Date   Anxiety    COPD (chronic obstructive pulmonary disease) (HCC)    Depression    Diabetes mellitus without complication (HCC)    Difficult intubation    GERD (gastroesophageal reflux disease)    Hypertension     Past Surgical History:  Procedure Laterality Date   ABDOMINAL HYSTERECTOMY     APPENDECTOMY     COLON SURGERY     COLONOSCOPY WITH PROPOFOL  N/A 03/02/2023   Procedure: COLONOSCOPY WITH PROPOFOL ;  Surgeon: Luke Salaam, MD;  Location: [facility] ENDOSCOPY;  Service: Gastroenterology;  Laterality: N/A;   LIPOMA EXCISION Left 08/08/2019   Procedure: EXCISION LIPOMA Left Arm;  Surgeon: Emmalene Hare, MD;  Location: [facility] ORS;  Service: General;  Laterality: Left;   POLYPECTOMY  03/02/2023   Procedure: POLYPECTOMY;  Surgeon: Luke Salaam, MD;  Location: [facility]  ENDOSCOPY;  Service: Gastroenterology;;   TONSILLECTOMY      Current Medications: Current Meds  Medication Sig   albuterol  (PROAIR  HFA) 108 (90 Base) MCG/ACT inhaler Inhale 2 puffs into the lungs every 4 hours as needed. Replaces Ventolin .   ARIPiprazole (ABILIFY) 10 MG tablet Take 10 mg by mouth daily.   aspirin EC 81 MG tablet Take 1 tablet (81 mg total) by mouth daily. Swallow whole.   atorvastatin  (LIPITOR) 80 MG tablet Take 1 tablet (80 mg total) by mouth daily.   blood glucose meter kit and supplies KIT Dispense based on patient and insurance preference. Use up to four times daily as directed. (FOR ICD-9 250.00, 250.01).   Blood Pressure Monitor KIT 1 kit by Does not apply route daily.   DULERA  200-5 MCG/ACT AERO Inhale 2 puffs into the lungs 2 (two) times daily.   isosorbide mononitrate (IMDUR) 30 MG 24 hr tablet Take 1 tablet (30 mg total) by mouth daily.   JARDIANCE 10 MG TABS tablet Take 10 mg by mouth daily.   metFORMIN  (GLUCOPHAGE -XR) 500 MG 24 hr tablet Take 500 mg by mouth 2 (two) times daily.   metoprolol  tartrate (LOPRESSOR ) 50 MG tablet Take 1 tablet by mouth once for procedure.   mometasone -formoterol  (DULERA ) 100-5 MCG/ACT AERO Inhale 2 puffs into the lungs 2 (two) times daily.   zolpidem  (AMBIEN ) 10 MG tablet Take 10 mg by mouth at bedtime as needed.     Allergies:  Metformin  and related and Codeine   Social History   Socioeconomic History   Marital status: Married    Spouse name: Not on file   Number of children: 2   Years of education: Not on file   Highest education level: Not on file  Occupational History   Not on file  Tobacco Use   Smoking status: Every Day    Current packs/day: 0.75    Average packs/day: 0.8 packs/day for 48.0 years (36.0 ttl pk-yrs)    Types: Cigarettes   Smokeless tobacco: Never  Vaping Use   Vaping status: Former  Substance and Sexual Activity   Alcohol use: No   Drug use: Never   Sexual activity: Not on file  Other Topics  Concern   Not on file  Social History Narrative   Social determinant screening completed. Patient is in stable housing, is on food stamps, social security, and retirement. Patient does not require a referral to [facility] at this point in time. Follows therapist at [facility] for mental health. HS   Social Drivers of Corporate investment banker Strain: Low Risk  (09/12/2019)   Overall Financial Resource Strain (CARDIA)    Difficulty of Paying Living Expenses: Not very hard  Food Insecurity: No Food Insecurity (03/26/2022)   Hunger Vital Sign    Worried About Running Out of Food in the Last Year: Never true    Ran Out of Food in the Last Year: Never true  Transportation Needs: No Transportation Needs (03/26/2022)   PRAPARE - Administrator, Civil Service (Medical): No    Lack of Transportation (Non-Medical): No  Physical Activity: Inactive (09/12/2019)   Exercise Vital Sign    Days of Exercise per Week: 0 days    Minutes of Exercise per Session: 0 min  Stress: Stress Concern Present (09/12/2019)   [employer] of Occupational Health - Occupational Stress Questionnaire    Feeling of Stress : To some extent  Social Connections: Socially Integrated (09/12/2019)   Social Connection and Isolation Panel [NHANES]    Frequency of Communication with Friends and Family: More than three times a week    Frequency of Social Gatherings with Friends and Family: More than three times a week    Attends Religious Services: 1 to 4 times per year    Active Member of [employer] or Organizations: Yes    Attends Banker Meetings: 1 to 4 times per year    Marital Status: Married     Family History: The patient's family history includes Cancer in her maternal aunt; Lung cancer (age of onset: 41) in her father.  ROS:   Please see the history of present illness.     All other systems reviewed and are negative.  EKGs/Labs/Other Studies Reviewed:    The following studies were reviewed  today:  EKG Interpretation Date/Time:  Friday October 08 2023 15:22:52 EDT Ventricular Rate:  72 PR Interval:  140 QRS Duration:  92 QT Interval:  406 QTC Calculation: 444 R Axis:   45  Text Interpretation: Normal sinus rhythm Normal ECG Confirmed by Constancia Delton ([id number]) on 10/08/2023 3:23:36 PM    Recent Labs: 07/08/2023: BUN 18; Creatinine, Ser 1.24; Potassium 4.7; Sodium 142  Recent Lipid Panel    Component Value Date/Time   CHOL 190 07/08/2023 1145   TRIG 142 07/08/2023 1145   HDL 53 07/08/2023 1145   CHOLHDL 3.6 07/08/2023 1145   LDLCALC 112 (H) 07/08/2023 1145  Risk Assessment/Calculations:             Physical Exam:    VS:  BP 119/78 (BP Location: Left Arm, Patient Position: Sitting, Cuff Size: Normal)   Pulse 72   Wt 143 lb (64.9 kg)   SpO2 91%   BMI 24.55 kg/m     Wt Readings from Last 3 Encounters:  10/08/23 143 lb (64.9 kg)  07/08/23 156 lb (70.8 kg)  03/02/23 158 lb (71.7 kg)     GEN:  Well nourished, well developed in no acute distress HEENT: Normal NECK: No JVD; No carotid bruits CARDIAC: RRR, no murmurs, rubs, gallops RESPIRATORY: Diminished breath sounds bilaterally, no wheezing. ABDOMEN: Soft, non-tender, non-distended MUSCULOSKELETAL:  No edema; No deformity  SKIN: Warm and dry NEUROLOGIC:  Alert and oriented x 3 PSYCHIATRIC:  Normal affect   ASSESSMENT:    1. Coronary artery disease involving native heart, unspecified vessel or lesion type, unspecified whether angina present   2. Primary hypertension   3. Mixed hyperlipidemia   4. Smoking greater than 40 pack years   5. Pulmonary emphysema, unspecified emphysema type (HCC)   6. Other emphysema (HCC)    PLAN:    In order of problems listed above:  Mild nonobstructive CAD, CCTA 1/25 mild proximal LAD, RCA, LCx stenosis.  Echo 2/25 EF 70%.  Start Imdur  30 mg daily, Lipitor 80 mg daily, aspirin  81 mg daily. Hypertension, BP controlled.  Continue lisinopril  5 mg daily, Norvasc   10 mg daily. Hyperlipidemia, recently switched to high intensity statin.  Continue Lipitor 80 mg daily.  Repeat lipid panel in 3 months.  Patient will call us  with exact dose of statin.  Not sure what she is taking Current smoker, smoking cessation advised. Emphysema noted on chest CT.  Referred to pulmonary medicine.  Follow-up in 3 months.     Medication Adjustments/Labs and Tests Ordered: Current medicines are reviewed at length with the patient today.  Concerns regarding medicines are outlined above.  Orders Placed This Encounter  Procedures   Lipid panel   Ambulatory referral to Pulmonology   EKG 12-Lead   Meds ordered this encounter  Medications   isosorbide  mononitrate (IMDUR ) 30 MG 24 hr tablet    Sig: Take 1 tablet (30 mg total) by mouth daily.    Dispense:  90 tablet    Refill:  3   aspirin  EC 81 MG tablet    Sig: Take 1 tablet (81 mg total) by mouth daily. Swallow whole.    Patient Instructions  Medication Instructions:  Your physician recommends the following medication changes.  START TAKING: Aspirin  81 mg by mouth daily Imdur  30 mg by mouth daily  *If you need a refill on your cardiac medications before your next appointment, please call your pharmacy*  Lab Work: Your provider would like for you to return in 3 months to have the following labs drawn: (Lipid).   Please go to [facility] [address] ([facility] [address], [place] [address] You do not need an appointment.  They are open from 8 am- 4:30 pm.  Lunch from 1:00 pm- 2:00 pm You will need to be fasting.   Testing/Procedures: No test ordered today   Follow-Up: At [facility], you and your health needs are our priority.  As part of our continuing mission to provide you with exceptional heart care, our providers are all part of one team.  This team includes your primary Cardiologist (physician) and Advanced Practice Providers or APPs (Physician  Assistants and Nurse  Practitioners) who all work together to provide you with the care you need, when you need it.  Your next appointment:   3 month(s)  Provider:   You may see Constancia Delton, MD or one of the following Advanced Practice Providers on your designated Care Team:   Laneta Pintos, NP Gildardo Labrador, PA-C Varney Gentleman, PA-C Cadence [place], PA-C Ronald Cockayne, NP Morey Ar, NP    We recommend signing up for the patient portal called "MyChart".  Sign up information is provided on this After Visit Summary.  MyChart is used to connect with patients for Virtual Visits (Telemedicine).  Patients are able to view lab/test results, encounter notes, upcoming appointments, etc.  Non-urgent messages can be sent to your provider as well.   To learn more about what you can do with MyChart, go to [email or web address].          Signed, Constancia Delton, MD  10/08/2023 4:38 PM    [facility]

## 2023-10-08 NOTE — Patient Instructions (Signed)
 Medication Instructions:  Your physician recommends the following medication changes.  START TAKING: Aspirin 81 mg by mouth daily Imdur 30 mg by mouth daily  *If you need a refill on your cardiac medications before your next appointment, please call your pharmacy*  Lab Work: Your provider would like for you to return in 3 months to have the following labs drawn: (Lipid).   Please go to [facility] [address] ([facility]) [address], [place] [address] You do not need an appointment.  They are open from 8 am- 4:30 pm.  Lunch from 1:00 pm- 2:00 pm You will need to be fasting.   Testing/Procedures: No test ordered today   Follow-Up: At [facility], you and your health needs are our priority.  As part of our continuing mission to provide you with exceptional heart care, our providers are all part of one team.  This team includes your primary Cardiologist (physician) and Advanced Practice Providers or APPs (Physician Assistants and Nurse Practitioners) who all work together to provide you with the care you need, when you need it.  Your next appointment:   3 month(s)  Provider:   You may see Constancia Delton, MD or one of the following Advanced Practice Providers on your designated Care Team:   Laneta Pintos, NP Gildardo Labrador, PA-C Varney Gentleman, PA-C Cadence Soso, PA-C Ronald Cockayne, NP Morey Ar, NP    We recommend signing up for the patient portal called "MyChart".  Sign up information is provided on this After Visit Summary.  MyChart is used to connect with patients for Virtual Visits (Telemedicine).  Patients are able to view lab/test results, encounter notes, upcoming appointments, etc.  Non-urgent messages can be sent to your provider as well.   To learn more about what you can do with MyChart, go to [email or web address].

## 2023-11-16 IMAGING — CT CT HEAD W/O CM
4 series · 17 of 47 positions shown, 19 images · non-contrast
Comparison: None Available.

CLINICAL DATA: Ataxic gait



[Series 2: head bone · axial · 0.40mm/px · z∈[-113,-63]mm · 4 of 73 slices shown]
[im 8/73  bone]
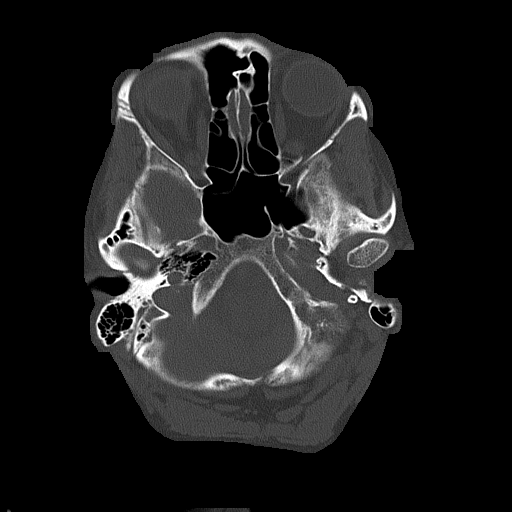
[im 15/73  bone]
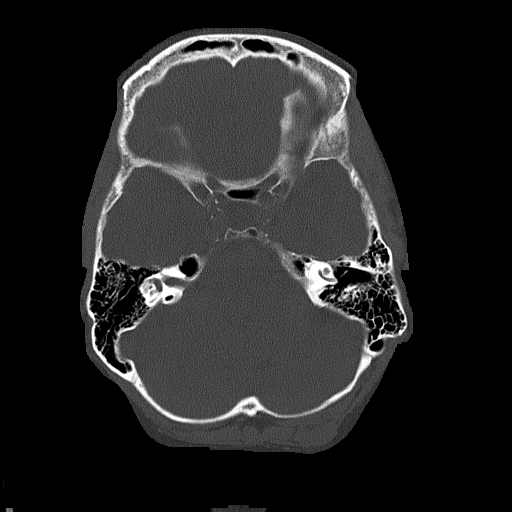
[im 22/73  bone]
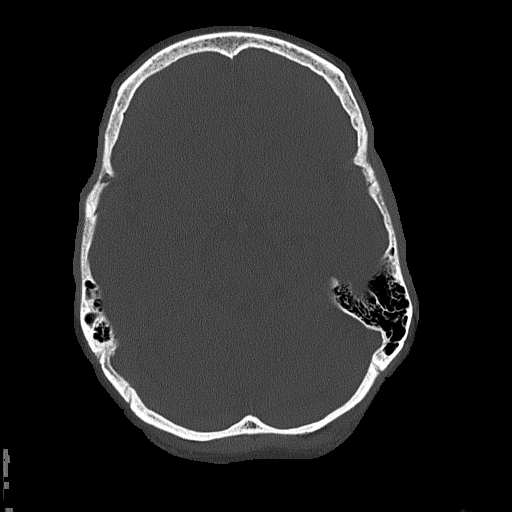
[im 33/73  bone]
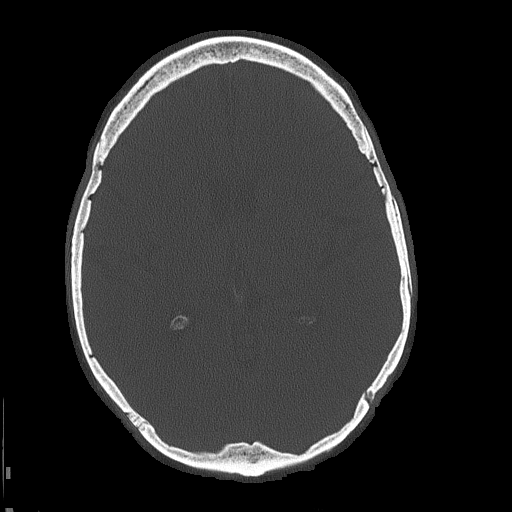

[Series 3: head wo · axial · 0.40mm/px · z∈[-111,-6]mm · 7 of 29 slices shown, 9 images]
[im 4/29  brain]
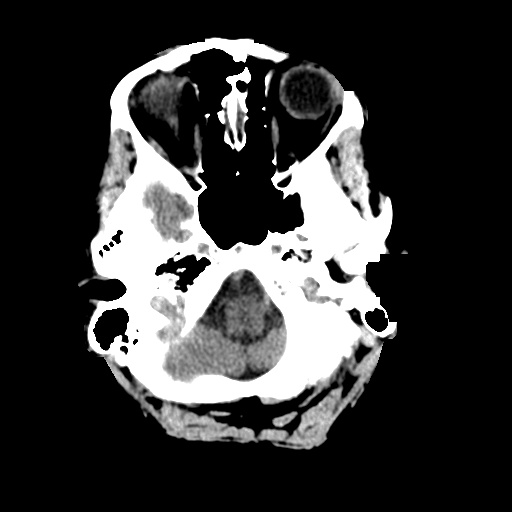
[im 4/29  bone]
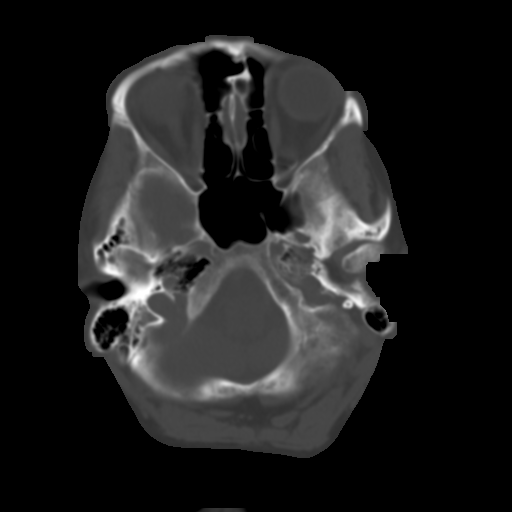
[im 8/29  brain]
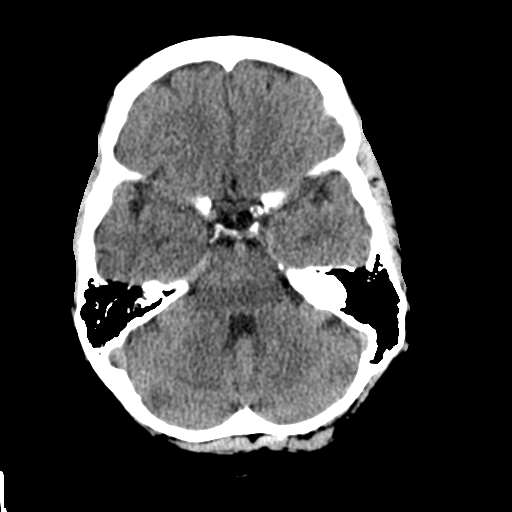
[im 11/29  brain]
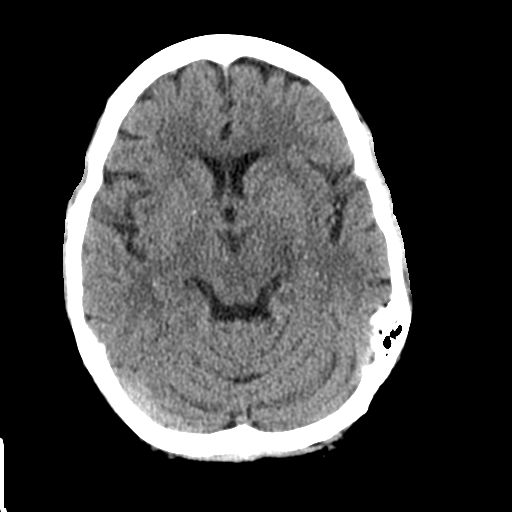
[im 15/29  brain]
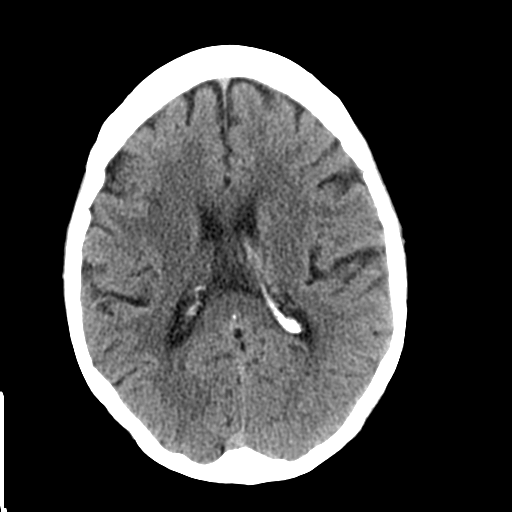
[im 18/29  brain]
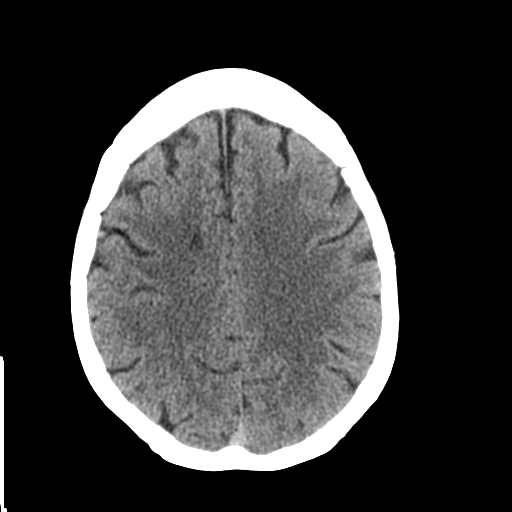
[im 18/29  bone]
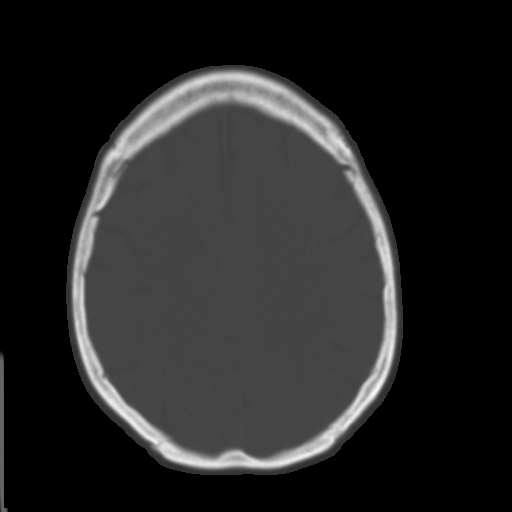
[im 22/29  brain]
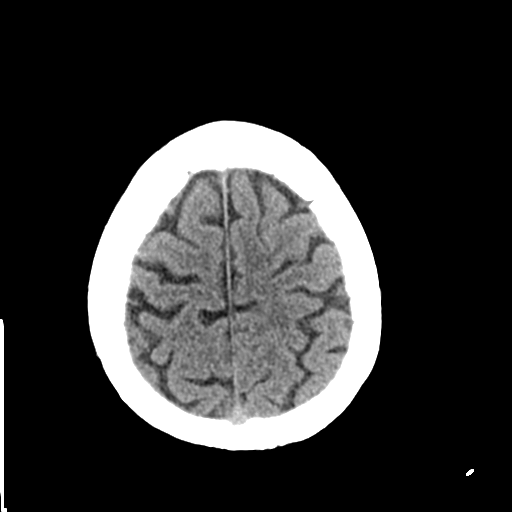
[im 25/29  brain]
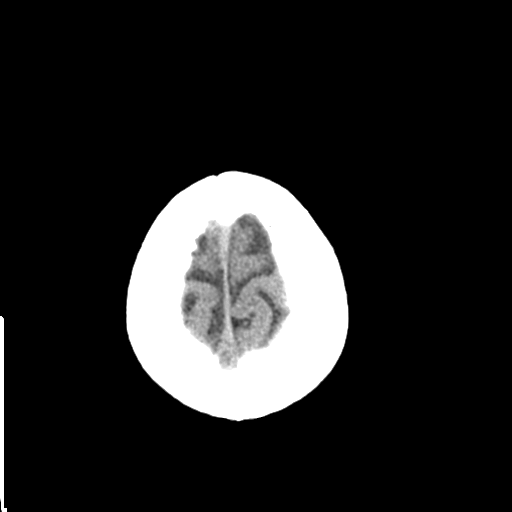

[Series 4: coronal soft tissue · coronal · 0.31mm/px · 3 of 65 slices shown]
[im 22/65  brain]
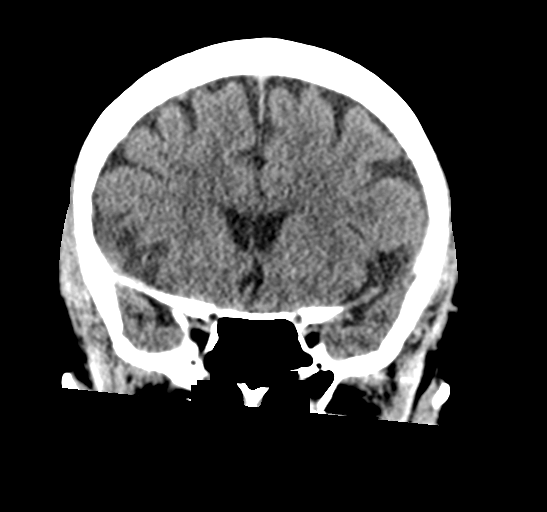
[im 29/65  brain]
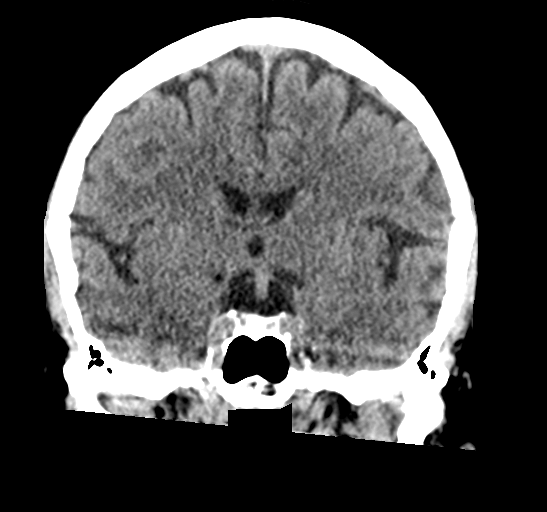
[im 36/65  brain]
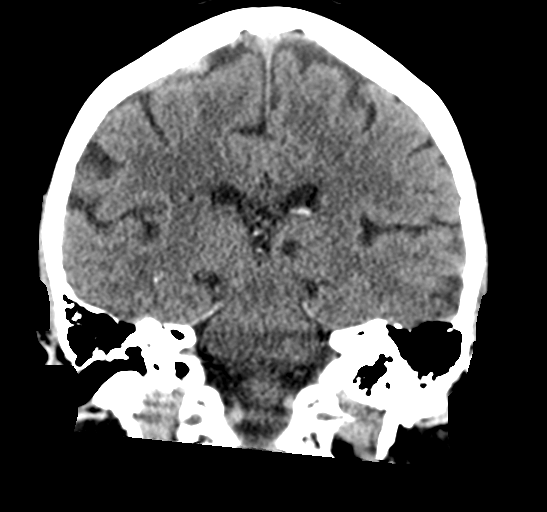

[Series 5: sagittal soft tissue · sagittal · 0.31mm/px · 3 of 58 slices shown]
[im 20/58  brain]
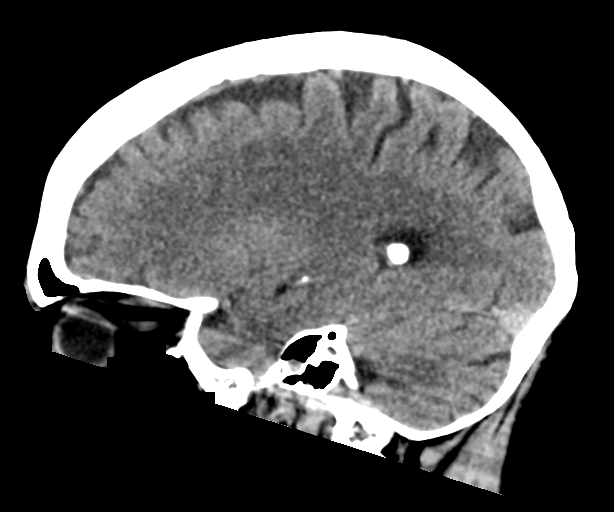
[im 29/58  brain]
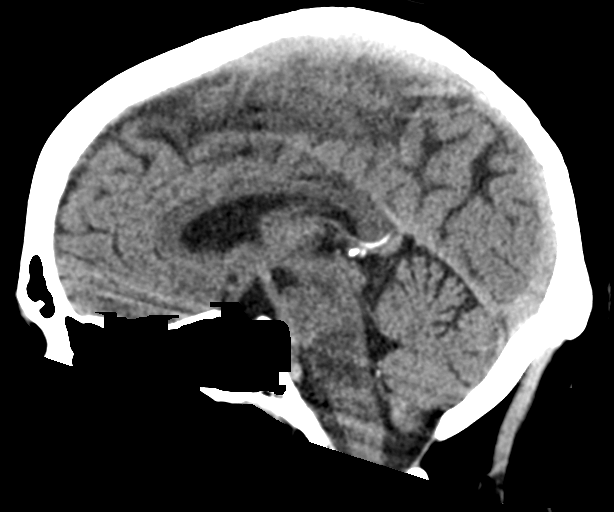
[im 39/58  brain]
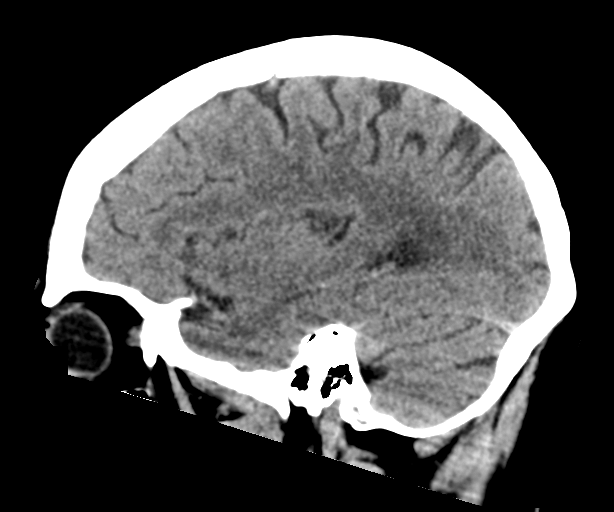

[17 of 47 positions shown; findings below may reference images not displayed]

FINDINGS: Brain: No evidence of acute infarction, hemorrhage, hydrocephalus,
extra-axial collection or mass lesion/mass effect.

Vascular: Negative for hyperdense vessel

Skull: Negative

Sinuses/Orbits: Negative

Other: None
IMPRESSION: Negative CT head

## 2023-11-18 ENCOUNTER — Ambulatory Visit: Admitting: Pulmonary Disease

## 2024-01-03 ENCOUNTER — Ambulatory Visit: Admitting: Pulmonary Disease

## 2024-01-10 ENCOUNTER — Ambulatory Visit: Admitting: Cardiology

## 2024-01-31 NOTE — Progress Notes (Unsigned)
 Cardiology Clinic Note   Date: 02/03/2024 ID: Griselle, Rufer March 24, 1956, MRN [id number]  Primary Cardiologist:  Redell Cave, MD  Chief Complaint   Karla Carter is a 68 y.o. female who presents to the clinic today for routine follow up.   Patient Profile   Karla Carter is followed by Dr. Cave for the history outlined below.      Past medical history significant for: Nonobstructive CAD. Coronary CTA 07/15/2023: Coronary calcium  score of 368 (92nd percentile).  Mild proximal LAD, LCx, RCA stenosis. Hypertension. Hyperlipidemia. Lipid panel 02/01/2024: LDL 115, HDL 43, TG 191, total 192. COPD. T2DM. Tobacco abuse.  In summary, patient was previously followed by Dr. Florencio with last visit in October 2023.  Patient was first evaluated by Dr. Cave on 07/08/2023 for progressive chest pain over 6 to 12 months with last episode the night prior lasting a few seconds.  Outside echo from October 2023 was reviewed and demonstrated EF > 55%.  Stress test at that time showed normal perfusion with no ischemia.  Coronary CTA showed a calcium  score of 368 with mild proximal LAD, LCx and RCA stenosis.  Patient was last seen in the office by Dr. Cave on 10/08/2023 for follow-up after testing.  She reported continued occasional chest pain episodes not associated with exertion.  Isosorbide  was added.     History of Present Illness    Today, patient reports continued chest pain unchanged from previous. Chest pain occurs on right side at random times and typically lasts 3-5 minutes. She will sometimes guzzle a carbonated drink so she can belch which provides instant relief. She is uncertain if the isosorbide  has helped her pain. She reports dyspnea with heavier exertion but none with routine activities. She reports she recently had high BP for 2 weeks. She tried to get in to see her PCP but was unable to get an appointment. It eventually returned to normal. She has a  wrist cuff at home. She smokes 5-10 cigarettes a day. She does not participate in regular exercise. She reported some dizziness when she first arrived today.     ROS: All other systems reviewed and are otherwise negative except as noted in History of Present Illness.  EKGs/Labs Reviewed    EKG Interpretation Date/Time:  Thursday February 03 2024 16:08:17 EDT Ventricular Rate:  69 PR Interval:  142 QRS Duration:  92 QT Interval:  404 QTC Calculation: 432 R Axis:   43  Text Interpretation: Normal sinus rhythm Nonspecific ST and T wave abnormality When compared with ECG of 08-Oct-2023 15:22, No significant change was found Confirmed by Loistine Sober [phone]) on 02/03/2024 4:11:13 PM   07/08/2023: BUN 18; Creatinine, Ser 1.24; Potassium 4.7; Sodium 142   Physical Exam    VS:  BP (!) 100/58 (BP Location: Left Arm, Patient Position: Sitting, Cuff Size: Normal)   Pulse 69   Ht 5' 4 (1.626 m)   Wt 143 lb 3.2 oz (65 kg)   SpO2 98%   BMI 24.58 kg/m  , BMI Body mass index is 24.58 kg/m.  Orthostatic VS for the past 24 hrs (Last 3 readings):  BP- Lying Pulse- Lying BP- Sitting Pulse- Sitting BP- Standing at 0 minutes Pulse- Standing at 0 minutes BP- Standing at 3 minutes Pulse- Standing at 3 minutes  02/03/24 1610 110/68 93 99/63 93 94/59 93 111/69 92     GEN: Well nourished, well developed, in no acute distress. Neck: No JVD or carotid bruits. Cardiac:  RRR.  No murmur. No rubs or gallops.   Respiratory:  Respirations regular and unlabored. Clear to auscultation without rales, wheezing or rhonchi. GI: Soft, nontender, nondistended. Extremities: Radials/DP/PT 2+ and equal bilaterally. No clubbing or cyanosis. No edema.  Skin: Warm and dry, no rash. Neuro: Strength intact.  Assessment & Plan   Nonobstructive CAD Coronary CTA January 2025 showed calcium  score of 368 with mild proximal LAD, LCx, RCA stenosis.  Patient reports right sided chest pain unchanged from previous. This  occurs randomly. She will sometimes drink something carbonated so she can belch which provides instant relief. She is not sure if isosorbide  helps. Will continue for now.  - No further workup indicated.  - Increase physical activity as tolerated.  - Continue amlodipine , aspirin , isosorbide , atorvastatin , fenofibrate .  Hypertension BP today 100/58. Patient reported mild dizziness when she first arrived today. Orthostatic vitals were negative. Patient is counseled on slow position changes. She had 2 weeks of elevated BP as checked on her wrist cuff at home. She tried to get in with PCP but there were no visits available. It normalized on its own. She reports compliance with medications during that time. She is instructed to bring wrist cuff to next appointment to check accuracy of cuff.  - Continue amlodipine , isosorbide , lisinopril .  Hyperlipidemia LDL 115, TG 191 August 2025, not at goal.  Patient reports she has not been taking atorvastatin .  Discussed cholesterol today and instructed patient to restart atorvastatin . - Restart atorvastatin , fenofibrate . - Increase physical activity as tolerated.  Tobacco abuse Patient reports she is smoking 5 to 10 cigarettes a day depending.  She states she previously was a chain smoker so this is a big improvement for her.  She rolls her own cigarettes and has been restricting how many cigarettes she rolls to so that is all she has available to her during the day.  This has helped to reduce her cigarette use.  She is congratulated for her efforts. - Encouraged complete smoking cessation.  Disposition: Restart atorvastatin .  Return in 6 months or sooner as needed.         Signed, Barnie HERO. Moncia Annas, DNP, NP-C

## 2024-02-02 ENCOUNTER — Ambulatory Visit: Payer: Self-pay | Admitting: Cardiology

## 2024-02-02 LAB — LIPID PANEL
Chol/HDL Ratio: 4.5 ratio — ABNORMAL HIGH (ref 0.0–4.4)
Cholesterol, Total: 192 mg/dL (ref 100–199)
HDL: 43 mg/dL (ref >39)
LDL Chol Calc (NIH): 115 mg/dL — ABNORMAL HIGH (ref 0–99)
Triglycerides: 191 mg/dL — ABNORMAL HIGH (ref 0–149)
VLDL Cholesterol Cal: 34 mg/dL (ref 5–40)

## 2024-02-03 ENCOUNTER — Encounter: Payer: Self-pay | Admitting: Student

## 2024-02-03 ENCOUNTER — Ambulatory Visit: Attending: Student | Admitting: Student

## 2024-02-03 VITALS — BP 100/58 | HR 69 | Ht 64.0 in | Wt 143.2 lb

## 2024-02-03 DIAGNOSIS — E785 Hyperlipidemia, unspecified: Secondary | ICD-10-CM

## 2024-02-03 DIAGNOSIS — R079 Chest pain, unspecified: Secondary | ICD-10-CM

## 2024-02-03 DIAGNOSIS — I10 Essential (primary) hypertension: Secondary | ICD-10-CM | POA: Diagnosis not present

## 2024-02-03 DIAGNOSIS — I2510 Atherosclerotic heart disease of native coronary artery without angina pectoris: Secondary | ICD-10-CM | POA: Diagnosis not present

## 2024-02-03 DIAGNOSIS — Z720 Tobacco use: Secondary | ICD-10-CM

## 2024-02-03 NOTE — Patient Instructions (Signed)
 Medication Instructions:  Your physician recommends that you continue on your current medications as directed. Please refer to the Current Medication list given to you today.   *If you need a refill on your cardiac medications before your next appointment, please call your pharmacy*  Lab Work: None ordered at this time  If you have labs (blood work) drawn today and your tests are completely normal, you will receive your results only by: MyChart Message (if you have MyChart) OR A paper copy in the mail If you have any lab test that is abnormal or we need to change your treatment, we will call you to review the results.  Testing/Procedures: None ordered at this time   Follow-Up: At [facility], you and your health needs are our priority.  As part of our continuing mission to provide you with exceptional heart care, our providers are all part of one team.  This team includes your primary Cardiologist (physician) and Advanced Practice Providers or APPs (Physician Assistants and Nurse Practitioners) who all work together to provide you with the care you need, when you need it.  Your next appointment:   6 month(s)  Provider:   Redell Cave, MD or Barnie Hila, NP    We recommend signing up for the patient portal called MyChart.  Sign up information is provided on this After Visit Summary.  MyChart is used to connect with patients for Virtual Visits (Telemedicine).  Patients are able to view lab/test results, encounter notes, upcoming appointments, etc.  Non-urgent messages can be sent to your provider as well.   To learn more about what you can do with MyChart, go to [email or web address].

## 2024-02-15 MED ORDER — EZETIMIBE 10 MG PO TABS
10.0000 mg | ORAL_TABLET | Freq: Every day | ORAL | 3 refills | Status: AC
Start: 2024-02-15 — End: 2024-05-15

## 2024-05-05 ENCOUNTER — Other Ambulatory Visit (HOSPITAL_BASED_OUTPATIENT_CLINIC_OR_DEPARTMENT_OTHER): Payer: Self-pay
